# Patient Record
Sex: Male | Born: 1935 | Race: White | Hispanic: No | Marital: Married | Smoking: Former smoker
Health system: Southern US, Community
[De-identification: ages and names within clinical notes are randomized; demographics above are authoritative.]

## PROBLEM LIST (undated history)

## (undated) DIAGNOSIS — K469 Unspecified abdominal hernia without obstruction or gangrene: Secondary | ICD-10-CM

## (undated) DIAGNOSIS — E7800 Pure hypercholesterolemia, unspecified: Secondary | ICD-10-CM

## (undated) DIAGNOSIS — D696 Thrombocytopenia, unspecified: Secondary | ICD-10-CM

---

## 1998-02-13 ENCOUNTER — Ambulatory Visit (HOSPITAL_COMMUNITY): Admission: RE | Admit: 1998-02-13 | Discharge: 1998-02-13 | Payer: Self-pay | Admitting: Family Medicine

## 2007-07-10 ENCOUNTER — Emergency Department (HOSPITAL_COMMUNITY): Admission: EM | Admit: 2007-07-10 | Discharge: 2007-07-10 | Payer: Self-pay | Admitting: Emergency Medicine

## 2008-12-30 ENCOUNTER — Encounter: Admission: RE | Admit: 2008-12-30 | Discharge: 2008-12-30 | Payer: Self-pay | Admitting: Family Medicine

## 2009-01-24 ENCOUNTER — Ambulatory Visit: Payer: Self-pay | Admitting: Gastroenterology

## 2009-02-06 ENCOUNTER — Ambulatory Visit: Payer: Self-pay | Admitting: Gastroenterology

## 2010-12-18 LAB — DIFFERENTIAL
Basophils Absolute: 0.1
Eosinophils Absolute: 0.1
Lymphocytes Relative: 23
Lymphs Abs: 1.7
Monocytes Absolute: 0.5
Neutrophils Relative %: 68

## 2010-12-18 LAB — URINALYSIS, ROUTINE W REFLEX MICROSCOPIC
Nitrite: NEGATIVE
Specific Gravity, Urine: 1.005
pH: 6

## 2010-12-18 LAB — POCT CARDIAC MARKERS
CKMB, poc: 1.5
Operator id: 272551
Troponin i, poc: <0.05

## 2010-12-18 LAB — POCT I-STAT, CHEM 8
BUN: 11
Calcium, Ion: 1.17
Chloride: 107
Creatinine, Ser: 1
Glucose, Bld: 98
HCT: 44
Hemoglobin: 15
Sodium: 140
TCO2: 25

## 2010-12-18 LAB — CBC
RBC: 4.75
RDW: 14.1

## 2011-07-30 DIAGNOSIS — N401 Enlarged prostate with lower urinary tract symptoms: Secondary | ICD-10-CM | POA: Diagnosis not present

## 2011-08-06 DIAGNOSIS — N401 Enlarged prostate with lower urinary tract symptoms: Secondary | ICD-10-CM | POA: Diagnosis not present

## 2011-08-06 DIAGNOSIS — R972 Elevated prostate specific antigen [PSA]: Secondary | ICD-10-CM | POA: Diagnosis not present

## 2011-08-29 DIAGNOSIS — E785 Hyperlipidemia, unspecified: Secondary | ICD-10-CM | POA: Diagnosis not present

## 2011-09-05 DIAGNOSIS — M949 Disorder of cartilage, unspecified: Secondary | ICD-10-CM | POA: Diagnosis not present

## 2011-09-05 DIAGNOSIS — E785 Hyperlipidemia, unspecified: Secondary | ICD-10-CM | POA: Diagnosis not present

## 2011-09-05 DIAGNOSIS — L570 Actinic keratosis: Secondary | ICD-10-CM | POA: Diagnosis not present

## 2011-09-05 DIAGNOSIS — M899 Disorder of bone, unspecified: Secondary | ICD-10-CM | POA: Diagnosis not present

## 2011-09-05 DIAGNOSIS — F17200 Nicotine dependence, unspecified, uncomplicated: Secondary | ICD-10-CM | POA: Diagnosis not present

## 2011-09-05 DIAGNOSIS — B079 Viral wart, unspecified: Secondary | ICD-10-CM | POA: Diagnosis not present

## 2012-01-17 DIAGNOSIS — Z23 Encounter for immunization: Secondary | ICD-10-CM | POA: Diagnosis not present

## 2012-03-26 DIAGNOSIS — E785 Hyperlipidemia, unspecified: Secondary | ICD-10-CM | POA: Diagnosis not present

## 2012-03-26 DIAGNOSIS — Z0000 Encounter for general adult medical examination without abnormal findings: Secondary | ICD-10-CM | POA: Diagnosis not present

## 2012-03-26 DIAGNOSIS — Z1331 Encounter for screening for depression: Secondary | ICD-10-CM | POA: Diagnosis not present

## 2012-03-26 DIAGNOSIS — M949 Disorder of cartilage, unspecified: Secondary | ICD-10-CM | POA: Diagnosis not present

## 2012-04-09 DIAGNOSIS — M899 Disorder of bone, unspecified: Secondary | ICD-10-CM | POA: Diagnosis not present

## 2012-04-09 DIAGNOSIS — E785 Hyperlipidemia, unspecified: Secondary | ICD-10-CM | POA: Diagnosis not present

## 2012-04-09 DIAGNOSIS — M949 Disorder of cartilage, unspecified: Secondary | ICD-10-CM | POA: Diagnosis not present

## 2012-04-09 DIAGNOSIS — F17200 Nicotine dependence, unspecified, uncomplicated: Secondary | ICD-10-CM | POA: Diagnosis not present

## 2012-04-09 DIAGNOSIS — N182 Chronic kidney disease, stage 2 (mild): Secondary | ICD-10-CM | POA: Diagnosis not present

## 2012-04-09 DIAGNOSIS — N400 Enlarged prostate without lower urinary tract symptoms: Secondary | ICD-10-CM | POA: Diagnosis not present

## 2012-04-21 DIAGNOSIS — L570 Actinic keratosis: Secondary | ICD-10-CM | POA: Diagnosis not present

## 2012-04-21 DIAGNOSIS — C44611 Basal cell carcinoma of skin of unspecified upper limb, including shoulder: Secondary | ICD-10-CM | POA: Diagnosis not present

## 2012-04-21 DIAGNOSIS — D485 Neoplasm of uncertain behavior of skin: Secondary | ICD-10-CM | POA: Diagnosis not present

## 2012-04-21 DIAGNOSIS — D0460 Carcinoma in situ of skin of unspecified upper limb, including shoulder: Secondary | ICD-10-CM | POA: Diagnosis not present

## 2012-05-21 DIAGNOSIS — D0460 Carcinoma in situ of skin of unspecified upper limb, including shoulder: Secondary | ICD-10-CM | POA: Diagnosis not present

## 2012-05-21 DIAGNOSIS — C44611 Basal cell carcinoma of skin of unspecified upper limb, including shoulder: Secondary | ICD-10-CM | POA: Diagnosis not present

## 2012-06-16 DIAGNOSIS — H6120 Impacted cerumen, unspecified ear: Secondary | ICD-10-CM | POA: Diagnosis not present

## 2012-06-16 DIAGNOSIS — H902 Conductive hearing loss, unspecified: Secondary | ICD-10-CM | POA: Diagnosis not present

## 2012-08-05 DIAGNOSIS — R972 Elevated prostate specific antigen [PSA]: Secondary | ICD-10-CM | POA: Diagnosis not present

## 2012-08-05 DIAGNOSIS — N401 Enlarged prostate with lower urinary tract symptoms: Secondary | ICD-10-CM | POA: Diagnosis not present

## 2012-09-30 DIAGNOSIS — M899 Disorder of bone, unspecified: Secondary | ICD-10-CM | POA: Diagnosis not present

## 2012-09-30 DIAGNOSIS — E785 Hyperlipidemia, unspecified: Secondary | ICD-10-CM | POA: Diagnosis not present

## 2012-09-30 DIAGNOSIS — M949 Disorder of cartilage, unspecified: Secondary | ICD-10-CM | POA: Diagnosis not present

## 2012-10-06 DIAGNOSIS — E785 Hyperlipidemia, unspecified: Secondary | ICD-10-CM | POA: Diagnosis not present

## 2012-10-06 DIAGNOSIS — M949 Disorder of cartilage, unspecified: Secondary | ICD-10-CM | POA: Diagnosis not present

## 2012-10-06 DIAGNOSIS — F17200 Nicotine dependence, unspecified, uncomplicated: Secondary | ICD-10-CM | POA: Diagnosis not present

## 2012-10-06 DIAGNOSIS — N182 Chronic kidney disease, stage 2 (mild): Secondary | ICD-10-CM | POA: Diagnosis not present

## 2013-01-08 DIAGNOSIS — Z23 Encounter for immunization: Secondary | ICD-10-CM | POA: Diagnosis not present

## 2013-01-15 DIAGNOSIS — J019 Acute sinusitis, unspecified: Secondary | ICD-10-CM | POA: Diagnosis not present

## 2013-03-31 DIAGNOSIS — Z0000 Encounter for general adult medical examination without abnormal findings: Secondary | ICD-10-CM | POA: Diagnosis not present

## 2013-03-31 DIAGNOSIS — Z1331 Encounter for screening for depression: Secondary | ICD-10-CM | POA: Diagnosis not present

## 2013-03-31 DIAGNOSIS — M899 Disorder of bone, unspecified: Secondary | ICD-10-CM | POA: Diagnosis not present

## 2013-03-31 DIAGNOSIS — E785 Hyperlipidemia, unspecified: Secondary | ICD-10-CM | POA: Diagnosis not present

## 2013-03-31 DIAGNOSIS — M949 Disorder of cartilage, unspecified: Secondary | ICD-10-CM | POA: Diagnosis not present

## 2013-03-31 DIAGNOSIS — Z125 Encounter for screening for malignant neoplasm of prostate: Secondary | ICD-10-CM | POA: Diagnosis not present

## 2013-04-08 DIAGNOSIS — N400 Enlarged prostate without lower urinary tract symptoms: Secondary | ICD-10-CM | POA: Diagnosis not present

## 2013-04-08 DIAGNOSIS — M949 Disorder of cartilage, unspecified: Secondary | ICD-10-CM | POA: Diagnosis not present

## 2013-04-08 DIAGNOSIS — M899 Disorder of bone, unspecified: Secondary | ICD-10-CM | POA: Diagnosis not present

## 2013-04-08 DIAGNOSIS — E785 Hyperlipidemia, unspecified: Secondary | ICD-10-CM | POA: Diagnosis not present

## 2013-04-08 DIAGNOSIS — N182 Chronic kidney disease, stage 2 (mild): Secondary | ICD-10-CM | POA: Diagnosis not present

## 2013-06-08 DIAGNOSIS — R079 Chest pain, unspecified: Secondary | ICD-10-CM | POA: Diagnosis not present

## 2013-06-08 DIAGNOSIS — K219 Gastro-esophageal reflux disease without esophagitis: Secondary | ICD-10-CM | POA: Diagnosis not present

## 2013-06-08 DIAGNOSIS — R1013 Epigastric pain: Secondary | ICD-10-CM | POA: Diagnosis not present

## 2013-10-07 DIAGNOSIS — M949 Disorder of cartilage, unspecified: Secondary | ICD-10-CM | POA: Diagnosis not present

## 2013-10-07 DIAGNOSIS — E785 Hyperlipidemia, unspecified: Secondary | ICD-10-CM | POA: Diagnosis not present

## 2013-10-07 DIAGNOSIS — M899 Disorder of bone, unspecified: Secondary | ICD-10-CM | POA: Diagnosis not present

## 2013-10-14 DIAGNOSIS — M949 Disorder of cartilage, unspecified: Secondary | ICD-10-CM | POA: Diagnosis not present

## 2013-10-14 DIAGNOSIS — E785 Hyperlipidemia, unspecified: Secondary | ICD-10-CM | POA: Diagnosis not present

## 2013-10-14 DIAGNOSIS — N400 Enlarged prostate without lower urinary tract symptoms: Secondary | ICD-10-CM | POA: Diagnosis not present

## 2013-10-14 DIAGNOSIS — F17200 Nicotine dependence, unspecified, uncomplicated: Secondary | ICD-10-CM | POA: Diagnosis not present

## 2013-10-14 DIAGNOSIS — M899 Disorder of bone, unspecified: Secondary | ICD-10-CM | POA: Diagnosis not present

## 2013-10-14 DIAGNOSIS — N182 Chronic kidney disease, stage 2 (mild): Secondary | ICD-10-CM | POA: Diagnosis not present

## 2013-12-31 DIAGNOSIS — Z23 Encounter for immunization: Secondary | ICD-10-CM | POA: Diagnosis not present

## 2014-04-14 DIAGNOSIS — Z0000 Encounter for general adult medical examination without abnormal findings: Secondary | ICD-10-CM | POA: Diagnosis not present

## 2014-04-14 DIAGNOSIS — Z125 Encounter for screening for malignant neoplasm of prostate: Secondary | ICD-10-CM | POA: Diagnosis not present

## 2014-04-14 DIAGNOSIS — Z23 Encounter for immunization: Secondary | ICD-10-CM | POA: Diagnosis not present

## 2014-04-14 DIAGNOSIS — F17210 Nicotine dependence, cigarettes, uncomplicated: Secondary | ICD-10-CM | POA: Diagnosis not present

## 2014-04-14 DIAGNOSIS — Z1389 Encounter for screening for other disorder: Secondary | ICD-10-CM | POA: Diagnosis not present

## 2014-04-14 DIAGNOSIS — M859 Disorder of bone density and structure, unspecified: Secondary | ICD-10-CM | POA: Diagnosis not present

## 2014-04-14 DIAGNOSIS — E785 Hyperlipidemia, unspecified: Secondary | ICD-10-CM | POA: Diagnosis not present

## 2014-04-21 DIAGNOSIS — E785 Hyperlipidemia, unspecified: Secondary | ICD-10-CM | POA: Diagnosis not present

## 2014-04-21 DIAGNOSIS — N182 Chronic kidney disease, stage 2 (mild): Secondary | ICD-10-CM | POA: Diagnosis not present

## 2014-04-21 DIAGNOSIS — N400 Enlarged prostate without lower urinary tract symptoms: Secondary | ICD-10-CM | POA: Diagnosis not present

## 2014-04-21 DIAGNOSIS — M859 Disorder of bone density and structure, unspecified: Secondary | ICD-10-CM | POA: Diagnosis not present

## 2014-04-21 DIAGNOSIS — F17210 Nicotine dependence, cigarettes, uncomplicated: Secondary | ICD-10-CM | POA: Diagnosis not present

## 2014-10-20 DIAGNOSIS — N400 Enlarged prostate without lower urinary tract symptoms: Secondary | ICD-10-CM | POA: Diagnosis not present

## 2014-10-20 DIAGNOSIS — M859 Disorder of bone density and structure, unspecified: Secondary | ICD-10-CM | POA: Diagnosis not present

## 2014-10-20 DIAGNOSIS — E785 Hyperlipidemia, unspecified: Secondary | ICD-10-CM | POA: Diagnosis not present

## 2014-10-27 DIAGNOSIS — N182 Chronic kidney disease, stage 2 (mild): Secondary | ICD-10-CM | POA: Diagnosis not present

## 2014-10-27 DIAGNOSIS — N400 Enlarged prostate without lower urinary tract symptoms: Secondary | ICD-10-CM | POA: Diagnosis not present

## 2014-10-27 DIAGNOSIS — I714 Abdominal aortic aneurysm, without rupture: Secondary | ICD-10-CM | POA: Diagnosis not present

## 2014-10-27 DIAGNOSIS — F17210 Nicotine dependence, cigarettes, uncomplicated: Secondary | ICD-10-CM | POA: Diagnosis not present

## 2014-10-27 DIAGNOSIS — D696 Thrombocytopenia, unspecified: Secondary | ICD-10-CM | POA: Diagnosis not present

## 2015-01-06 DIAGNOSIS — Z23 Encounter for immunization: Secondary | ICD-10-CM | POA: Diagnosis not present

## 2015-04-25 DIAGNOSIS — Z1389 Encounter for screening for other disorder: Secondary | ICD-10-CM | POA: Diagnosis not present

## 2015-04-25 DIAGNOSIS — M8580 Other specified disorders of bone density and structure, unspecified site: Secondary | ICD-10-CM | POA: Diagnosis not present

## 2015-04-25 DIAGNOSIS — Z125 Encounter for screening for malignant neoplasm of prostate: Secondary | ICD-10-CM | POA: Diagnosis not present

## 2015-04-25 DIAGNOSIS — Z0000 Encounter for general adult medical examination without abnormal findings: Secondary | ICD-10-CM | POA: Diagnosis not present

## 2015-04-25 DIAGNOSIS — E663 Overweight: Secondary | ICD-10-CM | POA: Diagnosis not present

## 2015-04-25 DIAGNOSIS — E785 Hyperlipidemia, unspecified: Secondary | ICD-10-CM | POA: Diagnosis not present

## 2015-04-25 DIAGNOSIS — E559 Vitamin D deficiency, unspecified: Secondary | ICD-10-CM | POA: Diagnosis not present

## 2015-05-02 ENCOUNTER — Other Ambulatory Visit: Payer: Self-pay | Admitting: Internal Medicine

## 2015-05-02 DIAGNOSIS — D696 Thrombocytopenia, unspecified: Secondary | ICD-10-CM | POA: Diagnosis not present

## 2015-05-02 DIAGNOSIS — N182 Chronic kidney disease, stage 2 (mild): Secondary | ICD-10-CM | POA: Diagnosis not present

## 2015-05-02 DIAGNOSIS — F17210 Nicotine dependence, cigarettes, uncomplicated: Secondary | ICD-10-CM | POA: Diagnosis not present

## 2015-05-02 DIAGNOSIS — R0989 Other specified symptoms and signs involving the circulatory and respiratory systems: Secondary | ICD-10-CM

## 2015-05-02 DIAGNOSIS — I714 Abdominal aortic aneurysm, without rupture: Secondary | ICD-10-CM | POA: Diagnosis not present

## 2015-05-02 DIAGNOSIS — N400 Enlarged prostate without lower urinary tract symptoms: Secondary | ICD-10-CM | POA: Diagnosis not present

## 2015-05-02 DIAGNOSIS — E785 Hyperlipidemia, unspecified: Secondary | ICD-10-CM | POA: Diagnosis not present

## 2015-05-03 ENCOUNTER — Ambulatory Visit
Admission: RE | Admit: 2015-05-03 | Discharge: 2015-05-03 | Disposition: A | Discharge status: Home / Self Care | Payer: Medicare Other | Source: Ambulatory Visit | Attending: Internal Medicine | Admitting: Internal Medicine

## 2015-05-03 DIAGNOSIS — Z136 Encounter for screening for cardiovascular disorders: Secondary | ICD-10-CM | POA: Diagnosis not present

## 2015-05-03 DIAGNOSIS — I77811 Abdominal aortic ectasia: Secondary | ICD-10-CM | POA: Diagnosis not present

## 2015-05-03 DIAGNOSIS — R0989 Other specified symptoms and signs involving the circulatory and respiratory systems: Secondary | ICD-10-CM

## 2015-10-25 DIAGNOSIS — E559 Vitamin D deficiency, unspecified: Secondary | ICD-10-CM | POA: Diagnosis not present

## 2015-10-25 DIAGNOSIS — M8580 Other specified disorders of bone density and structure, unspecified site: Secondary | ICD-10-CM | POA: Diagnosis not present

## 2015-10-25 DIAGNOSIS — E785 Hyperlipidemia, unspecified: Secondary | ICD-10-CM | POA: Diagnosis not present

## 2015-10-25 DIAGNOSIS — N400 Enlarged prostate without lower urinary tract symptoms: Secondary | ICD-10-CM | POA: Diagnosis not present

## 2015-11-01 DIAGNOSIS — I714 Abdominal aortic aneurysm, without rupture: Secondary | ICD-10-CM | POA: Diagnosis not present

## 2015-11-01 DIAGNOSIS — D696 Thrombocytopenia, unspecified: Secondary | ICD-10-CM | POA: Diagnosis not present

## 2015-11-01 DIAGNOSIS — F17210 Nicotine dependence, cigarettes, uncomplicated: Secondary | ICD-10-CM | POA: Diagnosis not present

## 2015-11-01 DIAGNOSIS — E785 Hyperlipidemia, unspecified: Secondary | ICD-10-CM | POA: Diagnosis not present

## 2015-11-01 DIAGNOSIS — N182 Chronic kidney disease, stage 2 (mild): Secondary | ICD-10-CM | POA: Diagnosis not present

## 2016-01-05 DIAGNOSIS — Z23 Encounter for immunization: Secondary | ICD-10-CM | POA: Diagnosis not present

## 2016-04-30 DIAGNOSIS — E559 Vitamin D deficiency, unspecified: Secondary | ICD-10-CM | POA: Diagnosis not present

## 2016-04-30 DIAGNOSIS — E785 Hyperlipidemia, unspecified: Secondary | ICD-10-CM | POA: Diagnosis not present

## 2016-04-30 DIAGNOSIS — M8580 Other specified disorders of bone density and structure, unspecified site: Secondary | ICD-10-CM | POA: Diagnosis not present

## 2016-04-30 DIAGNOSIS — Z125 Encounter for screening for malignant neoplasm of prostate: Secondary | ICD-10-CM | POA: Diagnosis not present

## 2016-05-07 DIAGNOSIS — D696 Thrombocytopenia, unspecified: Secondary | ICD-10-CM | POA: Diagnosis not present

## 2016-05-07 DIAGNOSIS — I714 Abdominal aortic aneurysm, without rupture: Secondary | ICD-10-CM | POA: Diagnosis not present

## 2016-05-07 DIAGNOSIS — E785 Hyperlipidemia, unspecified: Secondary | ICD-10-CM | POA: Diagnosis not present

## 2016-05-07 DIAGNOSIS — N182 Chronic kidney disease, stage 2 (mild): Secondary | ICD-10-CM | POA: Diagnosis not present

## 2016-05-16 ENCOUNTER — Encounter: Payer: Self-pay | Admitting: Hematology and Oncology

## 2016-05-16 ENCOUNTER — Telehealth: Payer: Self-pay | Admitting: Hematology and Oncology

## 2016-05-16 NOTE — Telephone Encounter (Signed)
Appt has been scheduled for the pt to see Dr. Lindi Adie on 3/13 at 1pm. Per pt, preferred afternoon appt. Demographics verified. Aware to arrive 30 minutes early. Letter mailed.

## 2016-06-04 ENCOUNTER — Ambulatory Visit (HOSPITAL_BASED_OUTPATIENT_CLINIC_OR_DEPARTMENT_OTHER): Payer: Medicare Other | Admitting: Hematology and Oncology

## 2016-06-04 ENCOUNTER — Encounter: Payer: Self-pay | Admitting: Hematology and Oncology

## 2016-06-04 ENCOUNTER — Ambulatory Visit (HOSPITAL_BASED_OUTPATIENT_CLINIC_OR_DEPARTMENT_OTHER): Payer: Medicare Other

## 2016-06-04 DIAGNOSIS — D693 Immune thrombocytopenic purpura: Secondary | ICD-10-CM | POA: Diagnosis not present

## 2016-06-04 DIAGNOSIS — D696 Thrombocytopenia, unspecified: Secondary | ICD-10-CM | POA: Diagnosis not present

## 2016-06-04 LAB — CBC WITH DIFFERENTIAL/PLATELET
BASO%: 0.9 % (ref 0.0–2.0)
BASOS ABS: 0.1 10*3/uL (ref 0.0–0.1)
EOS%: 3.8 % (ref 0.0–7.0)
Eosinophils Absolute: 0.2 10*3/uL (ref 0.0–0.5)
HCT: 44.2 % (ref 38.4–49.9)
HGB: 15.1 g/dL (ref 13.0–17.1)
LYMPH%: 21.7 % (ref 14.0–49.0)
MCH: 29.7 pg (ref 27.2–33.4)
MCHC: 34.2 g/dL (ref 32.0–36.0)
MCV: 86.8 fL (ref 79.3–98.0)
MONO#: 0.4 10*3/uL (ref 0.1–0.9)
MONO%: 6.6 % (ref 0.0–14.0)
NEUT#: 3.7 10*3/uL (ref 1.5–6.5)
NEUT%: 67 % (ref 39.0–75.0)
Platelets: 102 10*3/uL — ABNORMAL LOW (ref 140–400)
RBC: 5.09 10*6/uL (ref 4.20–5.82)
RDW: 14.6 % (ref 11.0–14.6)
WBC: 5.6 10*3/uL (ref 4.0–10.3)
lymph#: 1.2 10*3/uL (ref 0.9–3.3)
nRBC: 0 % (ref 0–0)

## 2016-06-04 NOTE — Assessment & Plan Note (Signed)
Mild thrombocytopenia: Platelet count 100 on 04/30/2016; 99 on 10/25/2015, 100 in 04/25/2015 Remainder of the CBC with differential was normal, hemoglobin 14.3, WBC 5.2 with normal differential  Differential diagnosis: 1. Low-grade ITP 2. medication induced: On further review patient is not taking any medications that are associated with thrombocytopenia 3. Bone marrow factors 4. Hepatitis B/C 5. Splenomegaly: No enlarged spleen was palpable to physical exam.  I discussed with the patient that the level of thrombocytopenia is very mild and that these levels, there are usually no adverse effects. There is usually no risk of bleeding and hence it can be observed without making any changes to patient's medications or requiring any further investigations like bone marrow biopsies.  I recommended watchful monitoring. We can see the patient back in one year with labs and follow-up.

## 2016-06-04 NOTE — Progress Notes (Signed)
[facility] CONSULT NOTE  Patient Care Team: Merrilee Seashore, MD as PCP - General (Internal Medicine)  CHIEF COMPLAINTS/PURPOSE OF CONSULTATION:  Thrombocytopenia  HISTORY OF PRESENTING ILLNESS:  Zachary Bennett 82 y.o. male is here because of long-standing diagnosis of thrombocytopenia. He had recent blood work on 04/30/2013 which showed a platelet count of 100. In January 2017 he had the same platelet count. In April 2009 he had a platelet count of 124. He has had normal white count and normal hemoglobin levels. He denies any problems with bruising or bleeding.   I reviewed her records extensively and collaborated the history with the patient.  MEDICAL HISTORY:  AAA, hyperlipidemia, thrombocytopenia, GERD, chronic renal failure, BPH, osteopenia SURGICAL HISTORY: No prior surgeries SOCIAL HISTORY: Current tobacco user denies any alcohol integration drug use FAMILY HISTORY: Mother deceased in her 81s with cancer unknown type ALLERGIES:  has no allergies on file. MEDICATIONS:  Pravastatin 40 mg daily, aspirin 81 mg daily  REVIEW OF SYSTEMS:   Constitutional: Denies fevers, chills or abnormal night sweats Eyes: Denies blurriness of vision, double vision or watery eyes Ears, nose, mouth, throat, and face: Denies mucositis or sore throat Respiratory: Denies cough, dyspnea or wheezes Cardiovascular: Denies palpitation, chest discomfort or lower extremity swelling Gastrointestinal:  Enlarged spleen, no hepatomegaly Skin: Denies abnormal skin rashes Lymphatics: Denies new lymphadenopathy or easy bruising Neurological:Denies numbness, tingling or new weaknesses Behavioral/Psych: Mood is stable, no new changes  All other systems were reviewed with the patient and are negative.  PHYSICAL EXAMINATION: ECOG PERFORMANCE STATUS: 0 - Asymptomatic  Vitals:   06/04/16 1308  BP: (!) 143/67  Pulse: 77  Resp: 17  Temp: 97.4 F (36.3 C)   Filed Weights   06/04/16 1308   Weight: 173 lb 6.4 oz (78.7 kg)    GENERAL:alert, no distress and comfortable SKIN: skin color, texture, turgor are normal, no rashes or significant lesions EYES: normal, conjunctiva are pink and non-injected, sclera clear OROPHARYNX:no exudate, no erythema and lips, buccal mucosa, and tongue normal  NECK: supple, thyroid normal size, non-tender, without nodularity LYMPH:  no palpable lymphadenopathy in the cervical, axillary or inguinal LUNGS: clear to auscultation and percussion with normal breathing effort HEART: regular rate & rhythm and no murmurs and no lower extremity edema ABDOMEN:abdomen soft, non-tender and normal bowel sounds Musculoskeletal:no cyanosis of digits and no clubbing  PSYCH: alert & oriented x 3 with fluent speech NEURO: no focal motor/sensory deficits LABORATORY DATA:  I have reviewed the data as listed Lab Results  Component Value Date   WBC 5.6 06/04/2016   HGB 15.1 06/04/2016   HCT 44.2 06/04/2016   MCV 86.8 06/04/2016   PLT 102 (L) 06/04/2016   Lab Results  Component Value Date   NA 140 07/10/2007   K 4.1 07/10/2007   CL 107 07/10/2007    RADIOGRAPHIC STUDIES: I have personally reviewed the radiological reports and agreed with the findings in the report.  ASSESSMENT AND PLAN:  Thrombocytopenia ([place]) Mild thrombocytopenia: Platelet count 100 on 04/30/2016; 99 on 10/25/2015, 100 in 04/25/2015 Remainder of the CBC with differential was normal, hemoglobin 14.3, WBC 5.2 with normal differential  Differential diagnosis: 1. Low-grade ITP 2. medication induced: On further review patient is not taking any medications that are associated with thrombocytopenia 3. Bone marrow factors 4. Hepatitis B/C 5. Splenomegaly: No enlarged spleen was palpable to physical exam.  I ordered CBC with differential, hepatitis B and C testing and HIV testing, L-37 and folic acid levels. I  also obtained an ultrasound of the abdomen because I felt an enlarged spleen. I  will call him with the results of these tests. Patient does not need any acute intervention at this time.  I discussed with the patient that the level of thrombocytopenia is very mild and that these levels, there are usually no adverse effects. There is usually no risk of bleeding and hence it can be observed without making any changes to patient's medications or requiring any further investigations like bone marrow biopsies. If his platelets drop significantly, then we will perform a bone marrow biopsy. I recommended watchful monitoring. We can see the patient back in 6 months with labs and follow-up.  All questions were answered. The patient knows to call the clinic with any problems, questions or concerns.    Rulon Eisenmenger, MD 06/04/16

## 2016-06-05 LAB — VITAMIN B12: Vitamin B12: 577 pg/mL (ref 232–1245)

## 2016-06-05 LAB — FOLATE: Folate: >20 ng/mL (ref >3.0)

## 2016-06-05 LAB — HEPATITIS B SURFACE ANTIGEN: HBsAg Screen: NEGATIVE

## 2016-06-05 LAB — HIV ANTIBODY (ROUTINE TESTING W REFLEX): HIV Screen 4th Generation wRfx: NONREACTIVE

## 2016-06-05 LAB — HEPATITIS C ANTIBODY

## 2016-06-06 ENCOUNTER — Other Ambulatory Visit: Payer: Self-pay | Admitting: Emergency Medicine

## 2016-06-12 ENCOUNTER — Ambulatory Visit (HOSPITAL_COMMUNITY)
Admission: RE | Admit: 2016-06-12 | Discharge: 2016-06-12 | Disposition: A | Discharge status: Home / Self Care | Payer: Medicare Other | Source: Ambulatory Visit | Attending: Hematology and Oncology | Admitting: Hematology and Oncology

## 2016-06-12 DIAGNOSIS — R161 Splenomegaly, not elsewhere classified: Secondary | ICD-10-CM | POA: Insufficient documentation

## 2016-06-12 DIAGNOSIS — D696 Thrombocytopenia, unspecified: Secondary | ICD-10-CM | POA: Insufficient documentation

## 2016-09-13 DIAGNOSIS — H6123 Impacted cerumen, bilateral: Secondary | ICD-10-CM | POA: Diagnosis not present

## 2016-10-29 DIAGNOSIS — N400 Enlarged prostate without lower urinary tract symptoms: Secondary | ICD-10-CM | POA: Diagnosis not present

## 2016-10-29 DIAGNOSIS — M8580 Other specified disorders of bone density and structure, unspecified site: Secondary | ICD-10-CM | POA: Diagnosis not present

## 2016-10-29 DIAGNOSIS — E559 Vitamin D deficiency, unspecified: Secondary | ICD-10-CM | POA: Diagnosis not present

## 2016-10-29 DIAGNOSIS — E785 Hyperlipidemia, unspecified: Secondary | ICD-10-CM | POA: Diagnosis not present

## 2016-10-29 DIAGNOSIS — N182 Chronic kidney disease, stage 2 (mild): Secondary | ICD-10-CM | POA: Diagnosis not present

## 2016-11-05 DIAGNOSIS — I714 Abdominal aortic aneurysm, without rupture: Secondary | ICD-10-CM | POA: Diagnosis not present

## 2016-11-05 DIAGNOSIS — N182 Chronic kidney disease, stage 2 (mild): Secondary | ICD-10-CM | POA: Diagnosis not present

## 2016-11-05 DIAGNOSIS — E785 Hyperlipidemia, unspecified: Secondary | ICD-10-CM | POA: Diagnosis not present

## 2016-11-05 DIAGNOSIS — D696 Thrombocytopenia, unspecified: Secondary | ICD-10-CM | POA: Diagnosis not present

## 2016-12-04 ENCOUNTER — Other Ambulatory Visit: Payer: Self-pay

## 2016-12-04 DIAGNOSIS — D696 Thrombocytopenia, unspecified: Secondary | ICD-10-CM

## 2016-12-05 ENCOUNTER — Other Ambulatory Visit: Payer: Medicare Other

## 2016-12-05 ENCOUNTER — Ambulatory Visit: Payer: Medicare Other | Admitting: Hematology and Oncology

## 2016-12-05 NOTE — Assessment & Plan Note (Deleted)
Mild thrombocytopenia: Platelet count 100 on 04/30/2016; 99 on 10/25/2015, 100 in 04/25/2015 Remainder of the CBC with differential was normal, hemoglobin 14.3, WBC 5.2 with normal differential  Differential diagnosis: 1. Low-grade ITP 2. Bone marrow factors 3. B-12 577, folate > 20 4. Hepatitis B/C an HIV: Negative 5. Splenomegaly: Patient had moderate splenomegaly 17.4 cm  I suspect the cause of her thrombocytopenia to be related to splenic sequestration

## 2017-01-03 DIAGNOSIS — Z23 Encounter for immunization: Secondary | ICD-10-CM | POA: Diagnosis not present

## 2017-05-02 DIAGNOSIS — E559 Vitamin D deficiency, unspecified: Secondary | ICD-10-CM | POA: Diagnosis not present

## 2017-05-02 DIAGNOSIS — Z0000 Encounter for general adult medical examination without abnormal findings: Secondary | ICD-10-CM | POA: Diagnosis not present

## 2017-05-02 DIAGNOSIS — M8580 Other specified disorders of bone density and structure, unspecified site: Secondary | ICD-10-CM | POA: Diagnosis not present

## 2017-05-02 DIAGNOSIS — E785 Hyperlipidemia, unspecified: Secondary | ICD-10-CM | POA: Diagnosis not present

## 2017-05-02 DIAGNOSIS — Z125 Encounter for screening for malignant neoplasm of prostate: Secondary | ICD-10-CM | POA: Diagnosis not present

## 2017-05-08 DIAGNOSIS — Z0000 Encounter for general adult medical examination without abnormal findings: Secondary | ICD-10-CM | POA: Diagnosis not present

## 2017-05-08 DIAGNOSIS — N400 Enlarged prostate without lower urinary tract symptoms: Secondary | ICD-10-CM | POA: Diagnosis not present

## 2017-05-08 DIAGNOSIS — E785 Hyperlipidemia, unspecified: Secondary | ICD-10-CM | POA: Diagnosis not present

## 2017-05-08 DIAGNOSIS — I714 Abdominal aortic aneurysm, without rupture: Secondary | ICD-10-CM | POA: Diagnosis not present

## 2017-05-08 DIAGNOSIS — N182 Chronic kidney disease, stage 2 (mild): Secondary | ICD-10-CM | POA: Diagnosis not present

## 2017-05-08 DIAGNOSIS — R972 Elevated prostate specific antigen [PSA]: Secondary | ICD-10-CM | POA: Diagnosis not present

## 2017-05-08 DIAGNOSIS — D696 Thrombocytopenia, unspecified: Secondary | ICD-10-CM | POA: Diagnosis not present

## 2017-05-08 DIAGNOSIS — M8580 Other specified disorders of bone density and structure, unspecified site: Secondary | ICD-10-CM | POA: Diagnosis not present

## 2017-05-08 DIAGNOSIS — K4020 Bilateral inguinal hernia, without obstruction or gangrene, not specified as recurrent: Secondary | ICD-10-CM | POA: Diagnosis not present

## 2017-05-15 DIAGNOSIS — D696 Thrombocytopenia, unspecified: Secondary | ICD-10-CM | POA: Diagnosis not present

## 2017-05-15 DIAGNOSIS — Z720 Tobacco use: Secondary | ICD-10-CM | POA: Diagnosis not present

## 2017-05-15 DIAGNOSIS — N189 Chronic kidney disease, unspecified: Secondary | ICD-10-CM | POA: Diagnosis not present

## 2017-05-15 DIAGNOSIS — K4020 Bilateral inguinal hernia, without obstruction or gangrene, not specified as recurrent: Secondary | ICD-10-CM | POA: Diagnosis not present

## 2017-12-26 DIAGNOSIS — Z23 Encounter for immunization: Secondary | ICD-10-CM | POA: Diagnosis not present

## 2018-07-22 ENCOUNTER — Encounter (HOSPITAL_COMMUNITY): Payer: Self-pay | Admitting: Emergency Medicine

## 2018-07-22 ENCOUNTER — Ambulatory Visit (INDEPENDENT_AMBULATORY_CARE_PROVIDER_SITE_OTHER)
Admission: EM | Admit: 2018-07-22 | Discharge: 2018-07-22 | Disposition: A | Discharge status: Home / Self Care | Payer: Medicare Other | Source: Home / Self Care | Attending: Family Medicine | Admitting: Family Medicine

## 2018-07-22 ENCOUNTER — Encounter (HOSPITAL_COMMUNITY): Payer: Self-pay

## 2018-07-22 ENCOUNTER — Inpatient Hospital Stay (HOSPITAL_COMMUNITY)
Admission: EM | Admit: 2018-07-22 | Discharge: 2018-07-25 | DRG: 683 | Disposition: A | Discharge status: Home / Self Care | Payer: Medicare Other | Source: Ambulatory Visit | Attending: Internal Medicine | Admitting: Internal Medicine

## 2018-07-22 ENCOUNTER — Inpatient Hospital Stay (HOSPITAL_COMMUNITY): Payer: Medicare Other

## 2018-07-22 ENCOUNTER — Other Ambulatory Visit: Payer: Self-pay

## 2018-07-22 ENCOUNTER — Emergency Department (HOSPITAL_COMMUNITY): Payer: Medicare Other

## 2018-07-22 DIAGNOSIS — R1013 Epigastric pain: Secondary | ICD-10-CM

## 2018-07-22 DIAGNOSIS — R7989 Other specified abnormal findings of blood chemistry: Secondary | ICD-10-CM

## 2018-07-22 DIAGNOSIS — E860 Dehydration: Secondary | ICD-10-CM | POA: Diagnosis present

## 2018-07-22 DIAGNOSIS — R61 Generalized hyperhidrosis: Secondary | ICD-10-CM | POA: Diagnosis present

## 2018-07-22 DIAGNOSIS — E785 Hyperlipidemia, unspecified: Secondary | ICD-10-CM | POA: Diagnosis present

## 2018-07-22 DIAGNOSIS — K4020 Bilateral inguinal hernia, without obstruction or gangrene, not specified as recurrent: Secondary | ICD-10-CM | POA: Diagnosis not present

## 2018-07-22 DIAGNOSIS — R6881 Early satiety: Secondary | ICD-10-CM | POA: Diagnosis present

## 2018-07-22 DIAGNOSIS — R630 Anorexia: Secondary | ICD-10-CM | POA: Diagnosis present

## 2018-07-22 DIAGNOSIS — R945 Abnormal results of liver function studies: Secondary | ICD-10-CM

## 2018-07-22 DIAGNOSIS — C8590 Non-Hodgkin lymphoma, unspecified, unspecified site: Secondary | ICD-10-CM

## 2018-07-22 DIAGNOSIS — R1012 Left upper quadrant pain: Secondary | ICD-10-CM

## 2018-07-22 DIAGNOSIS — N179 Acute kidney failure, unspecified: Secondary | ICD-10-CM

## 2018-07-22 DIAGNOSIS — N400 Enlarged prostate without lower urinary tract symptoms: Secondary | ICD-10-CM | POA: Diagnosis present

## 2018-07-22 DIAGNOSIS — R000 Tachycardia, unspecified: Secondary | ICD-10-CM

## 2018-07-22 DIAGNOSIS — R161 Splenomegaly, not elsewhere classified: Secondary | ICD-10-CM | POA: Diagnosis present

## 2018-07-22 DIAGNOSIS — R590 Localized enlarged lymph nodes: Secondary | ICD-10-CM | POA: Diagnosis present

## 2018-07-22 DIAGNOSIS — I723 Aneurysm of iliac artery: Secondary | ICD-10-CM | POA: Diagnosis present

## 2018-07-22 DIAGNOSIS — D61818 Other pancytopenia: Secondary | ICD-10-CM | POA: Diagnosis present

## 2018-07-22 DIAGNOSIS — F17210 Nicotine dependence, cigarettes, uncomplicated: Secondary | ICD-10-CM | POA: Diagnosis present

## 2018-07-22 DIAGNOSIS — K8020 Calculus of gallbladder without cholecystitis without obstruction: Secondary | ICD-10-CM | POA: Diagnosis not present

## 2018-07-22 DIAGNOSIS — J90 Pleural effusion, not elsewhere classified: Secondary | ICD-10-CM | POA: Diagnosis not present

## 2018-07-22 HISTORY — DX: Pure hypercholesterolemia, unspecified: E78.00

## 2018-07-22 HISTORY — DX: Unspecified abdominal hernia without obstruction or gangrene: K46.9

## 2018-07-22 HISTORY — DX: Thrombocytopenia, unspecified: D69.6

## 2018-07-22 LAB — CBC WITH DIFFERENTIAL/PLATELET
Abs Immature Granulocytes: 0.01 10*3/uL (ref 0.00–0.07)
Basophils Absolute: 0 10*3/uL (ref 0.0–0.1)
Basophils Relative: 1 %
Eosinophils Absolute: 0 10*3/uL (ref 0.0–0.5)
Eosinophils Relative: 1 %
HCT: 38.8 % — ABNORMAL LOW (ref 39.0–52.0)
Hemoglobin: 12.4 g/dL — ABNORMAL LOW (ref 13.0–17.0)
Immature Granulocytes: 0 %
Lymphocytes Relative: 25 %
Lymphs Abs: 0.7 10*3/uL (ref 0.7–4.0)
MCH: 26.5 pg (ref 26.0–34.0)
MCHC: 32 g/dL (ref 30.0–36.0)
MCV: 82.9 fL (ref 80.0–100.0)
Monocytes Absolute: 0.3 10*3/uL (ref 0.1–1.0)
Monocytes Relative: 9 %
Neutro Abs: 1.8 10*3/uL (ref 1.7–7.7)
Neutrophils Relative %: 64 %
Platelets: 49 10*3/uL — ABNORMAL LOW (ref 150–400)
RBC: 4.68 MIL/uL (ref 4.22–5.81)
RDW: 15 % (ref 11.5–15.5)
WBC: 2.9 10*3/uL — ABNORMAL LOW (ref 4.0–10.5)
nRBC: 0 % (ref 0.0–0.2)

## 2018-07-22 LAB — URINALYSIS, ROUTINE W REFLEX MICROSCOPIC
Glucose, UA: NEGATIVE mg/dL
Ketones, ur: 5 mg/dL — AB
Leukocytes,Ua: NEGATIVE
Nitrite: NEGATIVE
Protein, ur: 100 mg/dL — AB
Specific Gravity, Urine: 1.027 (ref 1.005–1.030)
pH: 5 (ref 5.0–8.0)

## 2018-07-22 LAB — COMPREHENSIVE METABOLIC PANEL
ALT: 40 U/L (ref 0–44)
AST: 93 U/L — ABNORMAL HIGH (ref 15–41)
Albumin: 3.7 g/dL (ref 3.5–5.0)
Alkaline Phosphatase: 301 U/L — ABNORMAL HIGH (ref 38–126)
Anion gap: 16 — ABNORMAL HIGH (ref 5–15)
BUN: 23 mg/dL (ref 8–23)
CO2: 21 mmol/L — ABNORMAL LOW (ref 22–32)
Calcium: 8.8 mg/dL — ABNORMAL LOW (ref 8.9–10.3)
Chloride: 97 mmol/L — ABNORMAL LOW (ref 98–111)
Creatinine, Ser: 1.65 mg/dL — ABNORMAL HIGH (ref 0.61–1.24)
GFR calc Af Amer: 44 mL/min — ABNORMAL LOW (ref >60)
GFR calc non Af Amer: 38 mL/min — ABNORMAL LOW (ref >60)
Glucose, Bld: 102 mg/dL — ABNORMAL HIGH (ref 70–99)
Potassium: 4.4 mmol/L (ref 3.5–5.1)
Sodium: 134 mmol/L — ABNORMAL LOW (ref 135–145)
Total Bilirubin: 4.9 mg/dL — ABNORMAL HIGH (ref 0.3–1.2)
Total Protein: 6.8 g/dL (ref 6.5–8.1)

## 2018-07-22 LAB — LIPASE, BLOOD: Lipase: 44 U/L (ref 11–51)

## 2018-07-22 MED ORDER — ONDANSETRON HCL 4 MG/2ML IJ SOLN
4.0000 mg | Freq: Once | INTRAMUSCULAR | Status: AC
  Administered 2018-07-22: 15:00:00 4 mg via INTRAVENOUS
  Filled 2018-07-22: qty 2

## 2018-07-22 MED ORDER — ZOLPIDEM TARTRATE 5 MG PO TABS
5.0000 mg | ORAL_TABLET | Freq: Every evening | ORAL | Status: DC | PRN
  Administered 2018-07-22 – 2018-07-24 (×3): 5 mg via ORAL
  Filled 2018-07-22 (×3): qty 1

## 2018-07-22 MED ORDER — SODIUM CHLORIDE 0.9 % IV SOLN
INTRAVENOUS | Status: DC
  Administered 2018-07-22: 15:00:00 via INTRAVENOUS

## 2018-07-22 MED ORDER — SODIUM CHLORIDE 0.9 % IV SOLN
INTRAVENOUS | Status: AC
  Administered 2018-07-22 – 2018-07-23 (×2): via INTRAVENOUS

## 2018-07-22 MED ORDER — IOHEXOL 300 MG/ML  SOLN
100.0000 mL | Freq: Once | INTRAMUSCULAR | Status: AC | PRN
  Administered 2018-07-22: 100 mL via INTRAVENOUS

## 2018-07-22 NOTE — ED Provider Notes (Signed)
[facility]   [id number] 07/22/18 Arrival Time: 6789  CC: ABDOMINAL pain  SUBJECTIVE:  Zachary Bennett is a 84 y.o. male hx significant for HLD thrombocytopenia, and bilateral abdominal hernias, who presents with complaint of gradual abdominal pain x 2 weeks.  Denies a precipitating event, trauma, close contacts with similar symptoms, recent travel or antibiotic use.  Localizes pain to epigastric region.  Describes as a "constant damn pain."  Relieved with stretching and having a bowel movement.  Has not tried OTC medications.  Denies aggravating factors.  Reports similar symptoms when he had appendicitis 30-40 years ago.  Last BM this morning and normal for patient. Complains of associated night sweats, weakness, and tachycardia.    Denies fever, chills, nausea, vomiting, chest pain, SOB, diarrhea, constipation, hematochezia, melena, dysuria, difficulty urinating, increased frequency or urgency, flank pain, loss of bowel or bladder function.  Last colonoscopy 10 years ago and normal at that time.  Told to come back in 10 years for next colonoscopy.    No LMP for male patient.  ROS: As per HPI.  Past Medical History:  Diagnosis Date  . Abdominal hernia   . High cholesterol   . Thrombocytopenia ([place])    No past surgical history on file. No Known Allergies No current facility-administered medications on file prior to encounter.    Current Outpatient Medications on File Prior to Encounter  Medication Sig Dispense Refill  . pravastatin (PRAVACHOL) 40 MG tablet      Social History   Socioeconomic History  . Marital status: Married    Spouse name: Not on file  . Number of children: Not on file  . Years of education: Not on file  . Highest education level: Not on file  Occupational History  . Not on file  Social Needs  . Financial resource strain: Not on file  . Food insecurity:    Worry: Not on file    Inability: Not on file  . Transportation needs:    Medical:  Not on file    Non-medical: Not on file  Tobacco Use  . Smoking status: Current Every Day Smoker  . Smokeless tobacco: Never Used  Substance and Sexual Activity  . Alcohol use: Not on file  . Drug use: Not on file  . Sexual activity: Not on file  Lifestyle  . Physical activity:    Days per week: Not on file    Minutes per session: Not on file  . Stress: Not on file  Relationships  . Social connections:    Talks on phone: Not on file    Gets together: Not on file    Attends religious service: Not on file    Active member of club or organization: Not on file    Attends meetings of clubs or organizations: Not on file    Relationship status: Not on file  . Intimate partner violence:    Fear of current or ex partner: Not on file    Emotionally abused: Not on file    Physically abused: Not on file    Forced sexual activity: Not on file  Other Topics Concern  . Not on file  Social History Narrative  . Not on file   No family history on file.   OBJECTIVE:  Vitals:   07/22/18 1118 07/22/18 1128  BP: 134/76   Pulse: (!) 110   Resp: 20   Temp: 97.9 F (36.6 C)   TempSrc: Oral   SpO2: 99%   Weight:  157 lb (71.2 kg)    General appearance: Alert; appears uncomfortable, but nontoxic HEENT: NCAT.  Oropharynx clear.  Lungs: clear to auscultation bilaterally without adventitious breath sounds Heart: tachycardia.  Abdomen: soft, mildly distended over LUQ; hypoactive bowel sounds; TTP over epigastric and LUQ region; negative Murphy's sign; minimal guarding; dullness to percussion of LUQ, and LLQ Back: no CVA tenderness Extremities: no edema; symmetrical with no gross deformities Skin: warm and dry Neurologic: normal gait Psychological: alert and cooperative; normal mood and affect   ASSESSMENT & PLAN:  1. Epigastric pain   2. LUQ pain   3. Tachycardia     Urine dip and x-ray deferred.   Recommending further evaluation and management in the ED for abdominal pain,  tachycardia, night sweats, and weakness x 2 weeks.  Patient aware and in agreement.  Declines transport.  Will go by private vehicle next door to [facility].     Lestine Box, PA-C 07/22/18 1223

## 2018-07-22 NOTE — ED Provider Notes (Signed)
[facility] EMERGENCY DEPARTMENT Provider Note   CSN: [id number] Arrival date & time: 07/22/18  1225    History   Chief Complaint Chief Complaint  Patient presents with   Abdominal Pain   Tachycardia    HPI Zachary Bennett is a 84 y.o. male.     HPI Presents with abdominal pain. Onset was about 2 weeks ago, but over the past week patient has had worsening pain, focally in the upper abdomen, and left upper quadrant. Patient has a history of bilateral inguinal hernias, has no pain in his lower abdomen, but with this concern he went to his surgeon first, today. He had evaluation here which was generally reassuring, but with upper abdominal pain he was sent to urgent care, and from that facility he was sent here. Patient denies fever, chills, notes early satiety in addition to anorexia, abdominal pain, and improvement with defecation. Pain is sore, severe, tight, currently 5/10, better at rest. Past Medical History:  Diagnosis Date   Abdominal hernia    High cholesterol    Thrombocytopenia [facility])     Patient Active Problem List   Diagnosis Date Noted   Thrombocytopenia ([place]) 06/04/2016    History reviewed. No pertinent surgical history.      Home Medications    Prior to Admission medications   Medication Sig Start Date End Date Taking? Authorizing Provider  pravastatin (PRAVACHOL) 40 MG tablet  04/06/18   [provider]    Family History History reviewed. No pertinent family history.  Social History Social History   Tobacco Use   Smoking status: Current Every Day Smoker   Smokeless tobacco: Never Used  Substance Use Topics   Alcohol use: Not on file   Drug use: Not on file     Allergies   Patient has no known allergies.   Review of Systems Review of Systems  Constitutional:       Per HPI, otherwise negative  HENT:       Per HPI, otherwise negative  Respiratory:       Per HPI, otherwise negative    Cardiovascular:       Per HPI, otherwise negative  Gastrointestinal: Negative for vomiting.  Endocrine:       Negative aside from HPI  Genitourinary:       Neg aside from HPI   Musculoskeletal:       Per HPI, otherwise negative  Skin: Negative.   Neurological: Negative for syncope.     Physical Exam Updated Vital Signs BP (!) 105/58    Pulse 91    Temp 97.8 F (36.6 C) (Oral)    Resp (!) 26    Wt 71.2 kg    SpO2 96%    BMI 27.81 kg/m   Physical Exam Vitals signs and nursing note reviewed.  Constitutional:      General: He is not in acute distress.    Appearance: He is well-developed.  HENT:     Head: Normocephalic and atraumatic.  Eyes:     Conjunctiva/sclera: Conjunctivae normal.  Cardiovascular:     Rate and Rhythm: Normal rate and regular rhythm.  Pulmonary:     Effort: Pulmonary effort is normal. No respiratory distress.     Breath sounds: No stridor.  Abdominal:     General: There is no distension.     Tenderness: There is abdominal tenderness in the epigastric area and left upper quadrant.     Comments: No lower abdominal pain, deformity.  Skin:  General: Skin is warm and dry.  Neurological:     Mental Status: He is alert and oriented to person, place, and time.      ED Treatments / Results  Labs (all labs ordered are listed, but only abnormal results are displayed) Labs Reviewed  COMPREHENSIVE METABOLIC PANEL - Abnormal; Notable for the following components:      Result Value   Sodium 134 (*)    Chloride 97 (*)    CO2 21 (*)    Glucose, Bld 102 (*)    Creatinine, Ser 1.65 (*)    Calcium 8.8 (*)    AST 93 (*)    Alkaline Phosphatase 301 (*)    Total Bilirubin 4.9 (*)    GFR calc non Af Amer 38 (*)    GFR calc Af Amer 44 (*)    Anion gap 16 (*)    All other components within normal limits  CBC WITH DIFFERENTIAL/PLATELET - Abnormal; Notable for the following components:   WBC 2.9 (*)    Hemoglobin 12.4 (*)    HCT 38.8 (*)    Platelets 49  (*)    All other components within normal limits  URINALYSIS, ROUTINE W REFLEX MICROSCOPIC - Abnormal; Notable for the following components:   Color, Urine AMBER (*)    APPearance HAZY (*)    Hgb urine dipstick SMALL (*)    Bilirubin Urine SMALL (*)    Ketones, ur 5 (*)    Protein, ur 100 (*)    Bacteria, UA RARE (*)    All other components within normal limits  LIPASE, BLOOD    EKG EKG Interpretation  Date/Time:  Wednesday July 22 2018 12:44:02 EDT Ventricular Rate:  106 PR Interval:    QRS Duration: 107 QT Interval:  335 QTC Calculation: 445 R Axis:   78 Text Interpretation:  Sinus tachycardia with irregular rate Low voltage, precordial leads Abnormal ekg Confirmed by Carmin Muskrat [id number]) on 07/22/2018 1:04:06 PM   Radiology Ct Abdomen Pelvis W Contrast  Addendum Date: 07/22/2018   ADDENDUM REPORT: 07/22/2018 16:10 ADDENDUM: The liver does not appear to be significantly abnormal, but given the presence of severe splenomegaly and porta hepatis adenopathy, severe hepatocellular disease or early hepatic cirrhosis cannot be excluded. Correlation with liver function tests is recommended. Electronically Signed   By: Marijo Conception M.D.   On: 07/22/2018 16:10   Result Date: 07/22/2018 CLINICAL DATA:  Generalized abdominal pain. EXAM: CT ABDOMEN AND PELVIS WITH CONTRAST TECHNIQUE: Multidetector CT imaging of the abdomen and pelvis was performed using the standard protocol following bolus administration of intravenous contrast. CONTRAST:  145mL OMNIPAQUE IOHEXOL 300 MG/ML  SOLN COMPARISON:  CT scan of December 30, 2008. FINDINGS: Lower chest: Minimal right pleural effusion is noted with adjacent subsegmental atelectasis. Hepatobiliary: Minimal cholelithiasis is noted with gallbladder wall thickening. No biliary dilatation is noted. The liver is unremarkable. Pancreas: Unremarkable. No pancreatic ductal dilatation or surrounding inflammatory changes. Spleen: There is interval development of  severe splenomegaly without focal abnormality. Adrenals/Urinary Tract: Adrenal glands are unremarkable. Kidneys are normal, without renal calculi, focal lesion, or hydronephrosis. Bladder is unremarkable. Stomach/Bowel: The stomach appears normal. There is no evidence of bowel obstruction or inflammation. The appendix is not clearly visualized. Vascular/Lymphatic: Atherosclerosis of abdominal aorta is noted. 2.4 cm fusiform right common iliac artery aneurysm is noted. Adenopathy is noted in the porta hepatis region as well as in the periaortic region. Porta hepatis adenopathy measures 4.7 x 2.6 cm. 12 mm right periaortic  lymph node is noted. Reproductive: Stable severe enlargement of prostate gland is noted. Other: Bilateral inguinal hernias are noted which contain loops of small bowel, but does not result in obstruction or incarceration. Musculoskeletal: No acute or significant osseous findings. IMPRESSION: Minimal cholelithiasis is noted with gallbladder wall thickening. Possible cholecystitis cannot be excluded. HIDA scan may be performed for further evaluation. Severe splenomegaly is noted with significantly enlarged adenopathy in the porta hepatis and periaortic regions. This is concerning for lymphoma or other malignancy and clinical correlation is recommended. Stable severe enlargement of prostate gland is noted. Mild bilateral inguinal hernias are noted which contain loops of small bowel, but does not result in incarceration or obstruction. 2.4 cm fusiform right common iliac artery aneurysm. Aortic Atherosclerosis (ICD10-I70.0). Electronically Signed: By: Marijo Conception M.D. On: 07/22/2018 15:51    Procedures Procedures (including critical care time)  Medications Ordered in ED Medications  0.9 %  sodium chloride infusion ( Intravenous New Bag/Given 07/22/18 1446)  ondansetron (ZOFRAN) injection 4 mg (4 mg Intravenous Given 07/22/18 1446)  iohexol (OMNIPAQUE) 300 MG/ML solution 100 mL (100 mLs  Intravenous Contrast Given 07/22/18 1527)     Initial Impression / Assessment and Plan / ED Course  I have reviewed the triage vital signs and the nursing notes.  Pertinent labs & imaging results that were available during my care of the patient were reviewed by me and considered in my medical decision making (see chart for details).    Reviewed the patient's urgent care documentation including concern for tachycardia, tachypnea, abdominal pain.    4:49 PM On repeat exam patient is awake and alert. I reviewed the labs, CT, and discussed findings with him. Findings most notable for hyperbilirubinemia, evidence for hepatic injury, and with concern for this, with consideration of differential including malignancy versus hepatobiliary dysfunction versus cholecystitis, the patient will require admission. Patient is afebrile, awake and alert, and pain is epigastric and left-sided, primarily right-sided, and with negative Murphy sign, lower suspicion for acute cholecystitis.  Patient also has findings concerning for acute kidney injury, with a creatinine 1.6, substantially greater than baseline. However, as above, patient requires admission for further monitoring, management.  Final Clinical Impressions(s) / ED Diagnoses   Final diagnoses:  Epigastric pain  Hyperbilirubinemia  AKI (acute kidney injury) ([place])     Carmin Muskrat, MD 07/22/18 1652

## 2018-07-22 NOTE — ED Notes (Signed)
Patient refuses transport to ED by [facility] staff, pt driving his personal vehicle to ED valet parking for continuity of care.

## 2018-07-22 NOTE — Discharge Instructions (Signed)
Recommending further evaluation and management in the ED for abdominal pain, tachycardia, night sweats, and weakness x 2 weeks.  Patient aware and in agreement.  Declines transport.  Will go by private vehicle next door to [facility]

## 2018-07-22 NOTE — H&P (Addendum)
History and Physical  Zachary Bennett KGM:[id number] DOB: Aug 19, 1934 DOA: 07/22/2018  Referring physician: EDP PCP: Zachary Seashore, MD   Chief Complaint: ab pain  HPI: Zachary Bennett is a 84 y.o. male   H/o HLD, thrombocytopenia sent from general surgery Dr Zachary Bennett office to ER for evaluation of abdominal pain. Patient reports has been having ongoing ab pain, epigastric and left side poor appetite, has not been eating much for a week, night sweats, no fever, no n/v, no weight loss. He is generally health until 2-3 weeks ago, he started to feel unwell and progressive left sided ab pain, not eating much. He denies cough, sob. Denies signs of bleeding, denies enlarged glands.  ED course: vital signs are stable,  labs significant for wbc 2.9, plt 49, cr 1.65, tbili 4.9, ast 93, alk phos 301.  EKG: Independently reviewed. Sinus arrhythmia with irregular rate, no acute st/t changes. CT Ab "Severe splenomegaly is noted with significantly enlarged adenopathy in the porta hepatis and periaortic regions. This is concerning for lymphoma or other malignancy and clinical correlation is recommended." Minimal cholelithiasis is noted with gallbladder wall thickening. Possible cholecystitis cannot be excluded. HIDA scan may be performed for further evaluation." he is given hydration , hospitalist called to admit the patient.  Review of Systems:  Detail per HPI, Review of systems are otherwise negative  Past Medical History:  Diagnosis Date   Abdominal hernia    High cholesterol    Thrombocytopenia ([place])    History reviewed. No pertinent surgical history. Social History:  reports that he has been smoking. He has never used smokeless tobacco. No history on file for alcohol and drug. Patient lives at home with wife & is able to participate in activities of daily living independently   No Known Allergies  History reviewed. No pertinent family history.    Prior to Admission medications     Medication Sig Start Date End Date Taking? Authorizing Provider  pravastatin (PRAVACHOL) 40 MG tablet  04/06/18   [provider]    Physical Exam: BP (!) 105/58    Pulse 91    Temp 97.8 F (36.6 C) (Oral)    Resp (!) 26    Wt 71.2 kg    SpO2 96%    BMI 27.81 kg/m   General:  Appear younger than stated age, NAD Eyes: PERRL ENT: unremarkable Neck: supple, no JVD Cardiovascular: RRR Respiratory: CTABL Abdomen: mild epigastric tenderness, LUQ fullness and tenderness, no guarding, no rebound,  positive bowel sounds Skin: no rash Musculoskeletal:  No edema Psychiatric: calm/cooperative Neurologic: no focal findings            Labs on Admission:  Basic Metabolic Panel: Recent Labs  Lab 07/22/18 1302  NA 134*  K 4.4  CL 97*  CO2 21*  GLUCOSE 102*  BUN 23  CREATININE 1.65*  CALCIUM 8.8*   Liver Function Tests: Recent Labs  Lab 07/22/18 1302  AST 93*  ALT 40  ALKPHOS 301*  BILITOT 4.9*  PROT 6.8  ALBUMIN 3.7   Recent Labs  Lab 07/22/18 1302  LIPASE 44   No results for input(s): AMMONIA in the last 168 hours. CBC: Recent Labs  Lab 07/22/18 1302  WBC 2.9*  NEUTROABS 1.8  HGB 12.4*  HCT 38.8*  MCV 82.9  PLT 49*   Cardiac Enzymes: No results for input(s): CKTOTAL, CKMB, CKMBINDEX, TROPONINI in the last 168 hours.  BNP (last 3 results) No results for input(s): BNP in the last 8760 hours.  ProBNP (  last 3 results) No results for input(s): PROBNP in the last 8760 hours.  CBG: No results for input(s): GLUCAP in the last 168 hours.  Radiological Exams on Admission: Ct Abdomen Pelvis W Contrast  Addendum Date: 07/22/2018   ADDENDUM REPORT: 07/22/2018 16:10 ADDENDUM: The liver does not appear to be significantly abnormal, but given the presence of severe splenomegaly and porta hepatis adenopathy, severe hepatocellular disease or early hepatic cirrhosis cannot be excluded. Correlation with liver function tests is recommended. Electronically Signed    By: Marijo Conception M.D.   On: 07/22/2018 16:10   Result Date: 07/22/2018 CLINICAL DATA:  Generalized abdominal pain. EXAM: CT ABDOMEN AND PELVIS WITH CONTRAST TECHNIQUE: Multidetector CT imaging of the abdomen and pelvis was performed using the standard protocol following bolus administration of intravenous contrast. CONTRAST:  13m OMNIPAQUE IOHEXOL 300 MG/ML  SOLN COMPARISON:  CT scan of December 30, 2008. FINDINGS: Lower chest: Minimal right pleural effusion is noted with adjacent subsegmental atelectasis. Hepatobiliary: Minimal cholelithiasis is noted with gallbladder wall thickening. No biliary dilatation is noted. The liver is unremarkable. Pancreas: Unremarkable. No pancreatic ductal dilatation or surrounding inflammatory changes. Spleen: There is interval development of severe splenomegaly without focal abnormality. Adrenals/Urinary Tract: Adrenal glands are unremarkable. Kidneys are normal, without renal calculi, focal lesion, or hydronephrosis. Bladder is unremarkable. Stomach/Bowel: The stomach appears normal. There is no evidence of bowel obstruction or inflammation. The appendix is not clearly visualized. Vascular/Lymphatic: Atherosclerosis of abdominal aorta is noted. 2.4 cm fusiform right common iliac artery aneurysm is noted. Adenopathy is noted in the porta hepatis region as well as in the periaortic region. Porta hepatis adenopathy measures 4.7 x 2.6 cm. 12 mm right periaortic lymph node is noted. Reproductive: Stable severe enlargement of prostate gland is noted. Other: Bilateral inguinal hernias are noted which contain loops of small bowel, but does not result in obstruction or incarceration. Musculoskeletal: No acute or significant osseous findings. IMPRESSION: Minimal cholelithiasis is noted with gallbladder wall thickening. Possible cholecystitis cannot be excluded. HIDA scan may be performed for further evaluation. Severe splenomegaly is noted with significantly enlarged adenopathy in the  porta hepatis and periaortic regions. This is concerning for lymphoma or other malignancy and clinical correlation is recommended. Stable severe enlargement of prostate gland is noted. Mild bilateral inguinal hernias are noted which contain loops of small bowel, but does not result in incarceration or obstruction. 2.4 cm fusiform right common iliac artery aneurysm. Aortic Atherosclerosis (ICD10-I70.0). Electronically Signed: By: JMarijo ConceptionM.D. On: 07/22/2018 15:51      Assessment/Plan Present on Admission:  AKI (acute kidney injury) ([facility]  Splenomegaly/night sweats/early satiety/thrombocytopenia Concerns for lymphoma Case discussed with hematology/ oncology Dr GLindi Adiewho recommend spleen biopsy to rule out lymphoma  transfuse platelet prior to spleen biopsy. IR consulted for spleen biopsy, keep npo aftermidnight, will need to coordinate with IR regarding platelet transfusion.  Pancytopenia Wbc 2.9, hgb 12.4, plt 49 Save blood smear Check indirect bilirubin Hematology/oncology consulted  Elevated bilirubin/mild elevated AST/ alk phos -Ct ab " Minimal cholelithiasis is noted with gallbladder wall thickening. Possible cholecystitis cannot be excluded. " The liver does not appear to be significantly abnormal, but given the presence of severe splenomegaly and porta hepatis adenopathy, severe hepatocellular disease or early hepatic cirrhosis cannot be excluded. Correlation with liver function tests is recommended." -Patient does has epigastric tenderness, but no RUQ pain, no n/v, no fever -Will check fractionated bilirubin, repeat lft in am -Will get ab u[place]-May need HIDa scan, pending ab  [place] result  AKI:  no obstruction on ct scan,  ua + ketone, urine specific gravity 1.027, rare bacteria, he denies urinary symptom He reports poor appetite, AKI likely prerenal due to dehydration Continue hydration, repeat bmp in am Renal dosing meds  HLD: hold statin for now due to elevated  lft  Stable severe enlargement of prostate gland is noted on CT ab, denies urinary symptom  Mild bilateral inguinal hernias are noted which contain loops of small bowel, but does not result in incarceration or obstruction.  2.4 cm fusiform right common iliac artery aneurysm.  DVT prophylaxis: scd's   Consultants: hematology/oncology  Code Status: full   Family Communication:  Patient , he declined my offer to call his son or his wife  Disposition Plan: admit to med surg  Time spent: 35mns  FFlorencia ReasonsMD, PhD [facility] Pager [phone] 7PM-7AM, please contact night-coverage at [email or web address], password [facility]

## 2018-07-22 NOTE — ED Triage Notes (Signed)
Pt here for further eval from [facility] and PCP. Abdominal pain x2 weeks, tachy at [facility].

## 2018-07-22 NOTE — Progress Notes (Signed)
Zachary Bennett is a 84 y.o. male patient admitted from ED awake, alert - oriented  X 4 - no acute distress noted.  VSS - Blood pressure 107/81, pulse (!) 101, temperature 97.9 F (36.6 C), temperature source Oral, resp. rate 18, weight 71.2 kg, SpO2 100 %.    IV in place, occlusive dsg intact without redness.  Orientation to room, and floor completed with information packet given to patient/family.  Patient declined safety video at this time.  Admission INP armband ID verified with patient/family, and in place.   SR up x 2, fall assessment complete, with patient and family able to verbalize understanding of risk associated with falls, and verbalized understanding to call nsg before up out of bed.  Call light within reach, patient able to voice, and demonstrate understanding.  Skin, clean-dry- intact without evidence of bruising, or skin tears.   No evidence of skin break down noted on exam.     Will cont to eval and treat per MD orders.  Tama High, RN 07/22/2018 6:45 PM

## 2018-07-22 NOTE — ED Triage Notes (Signed)
Pt states she has stomach pain. Pt states she was at the [facility] center to see Dr Redmond Pulling. He told him that his heart rate was up. Pt thinks he needs blood work find out what's wrong with his stomach this has been going on for 2 weeks.pt has night sweats as well.

## 2018-07-22 NOTE — ED Notes (Signed)
ED TO INPATIENT HANDOFF REPORT  ED Nurse Name and Phone #:   S Name/Age/Gender Rexene Edison 84 y.o. male Room/Bed: 008C/008C  Code Status   Code Status: Not on file  Home/SNF/Other Home Patient oriented to: self, place, time and situation Is this baseline? Yes   Triage Complete: Triage complete  Chief Complaint Abdominal Pain; LUQ Pain; Tachy  Triage Note Pt here for further eval from [facility] and PCP. Abdominal pain x2 weeks, tachy at [facility].    Allergies No Known Allergies  Level of Care/Admitting Diagnosis ED Disposition    ED Disposition Condition [facility] Area: [place] [[id number]]  Level of Care: Med-Surg [16]  Covid Evaluation: N/A  Diagnosis: AKI (acute kidney injury) [facility]) [[id number]]  Admitting Physician: Florencia Reasons [[id number]]  Attending Physician: Florencia Reasons [[id number]]  Estimated length of stay: past midnight tomorrow  Certification:: I certify this patient will need inpatient services for at least 2 midnights  PT Class (Do Not Modify): Inpatient [101]  PT Acc Code (Do Not Modify): Private [1]       B Medical/Surgery History Past Medical History:  Diagnosis Date  . Abdominal hernia   . High cholesterol   . Thrombocytopenia ([place])    History reviewed. No pertinent surgical history.   A IV Location/Drains/Wounds Patient Lines/Drains/Airways Status   Active Line/Drains/Airways    Name:   Placement date:   Placement time:   Site:   Days:   Peripheral IV 07/22/18 Left Antecubital   07/22/18    1315    Antecubital   less than 1          Intake/Output Last 24 hours No intake or output data in the 24 hours ending 07/22/18 1751  Labs/Imaging Results for orders placed or performed during the hospital encounter of 07/22/18 (from the past 48 hour(s))  Comprehensive metabolic panel     Status: Abnormal   Collection Time: 07/22/18  1:02 PM  Result Value Ref Range   Sodium 134 (L) 135 - 145 mmol/L   Potassium 4.4 3.5 - 5.1  mmol/L   Chloride 97 (L) 98 - 111 mmol/L   CO2 21 (L) 22 - 32 mmol/L   Glucose, Bld 102 (H) 70 - 99 mg/dL   BUN 23 8 - 23 mg/dL   Creatinine, Ser 1.65 (H) 0.61 - 1.24 mg/dL   Calcium 8.8 (L) 8.9 - 10.3 mg/dL   Total Protein 6.8 6.5 - 8.1 g/dL   Albumin 3.7 3.5 - 5.0 g/dL   AST 93 (H) 15 - 41 U/L   ALT 40 0 - 44 U/L   Alkaline Phosphatase 301 (H) 38 - 126 U/L   Total Bilirubin 4.9 (H) 0.3 - 1.2 mg/dL    Comment: ICTERUS AT THIS LEVEL MAY AFFECT RESULT   GFR calc non Af Amer 38 (L) >60 mL/min   GFR calc Af Amer 44 (L) >60 mL/min   Anion gap 16 (H) 5 - 15    Comment: Performed at [facility], [address] [place], [place] [address]  Lipase, blood     Status: None   Collection Time: 07/22/18  1:02 PM  Result Value Ref Range   Lipase 44 11 - 51 U/L    Comment: Performed at [facility] [address], [place], [place] [address]  CBC WITH DIFFERENTIAL     Status: Abnormal   Collection Time: 07/22/18  1:02 PM  Result Value Ref Range   WBC 2.9 (  L) 4.0 - 10.5 K/uL   RBC 4.68 4.22 - 5.81 MIL/uL   Hemoglobin 12.4 (L) 13.0 - 17.0 g/dL   HCT 38.8 (L) 39.0 - 52.0 %   MCV 82.9 80.0 - 100.0 fL   MCH 26.5 26.0 - 34.0 pg   MCHC 32.0 30.0 - 36.0 g/dL   RDW 15.0 11.5 - 15.5 %   Platelets 49 (L) 150 - 400 K/uL    Comment: REPEATED TO VERIFY PLATELET COUNT CONFIRMED BY SMEAR SPECIMEN CHECKED FOR CLOTS Immature Platelet Fraction may be clinically indicated, consider ordering this additional test [id number]    nRBC 0.0 0.0 - 0.2 %   Neutrophils Relative % 64 %   Neutro Abs 1.8 1.7 - 7.7 K/uL   Lymphocytes Relative 25 %   Lymphs Abs 0.7 0.7 - 4.0 K/uL   Monocytes Relative 9 %   Monocytes Absolute 0.3 0.1 - 1.0 K/uL   Eosinophils Relative 1 %   Eosinophils Absolute 0.0 0.0 - 0.5 K/uL   Basophils Relative 1 %   Basophils Absolute 0.0 0.0 - 0.1 K/uL   Immature Granulocytes 0 %   Abs Immature Granulocytes 0.01 0.00 - 0.07 K/uL    Comment: Performed at [facility], [address] [place], [place] [address]  Urinalysis, Routine w reflex microscopic     Status: Abnormal   Collection Time: 07/22/18  1:08 PM  Result Value Ref Range   Color, Urine AMBER (A) YELLOW    Comment: BIOCHEMICALS MAY BE AFFECTED BY COLOR   APPearance HAZY (A) CLEAR   Specific Gravity, Urine 1.027 1.005 - 1.030   pH 5.0 5.0 - 8.0   Glucose, UA NEGATIVE NEGATIVE mg/dL   Hgb urine dipstick SMALL (A) NEGATIVE   Bilirubin Urine SMALL (A) NEGATIVE   Ketones, ur 5 (A) NEGATIVE mg/dL   Protein, ur 100 (A) NEGATIVE mg/dL   Nitrite NEGATIVE NEGATIVE   Leukocytes,Ua NEGATIVE NEGATIVE   RBC / HPF 0-5 0 - 5 RBC/hpf   WBC, UA 0-5 0 - 5 WBC/hpf   Bacteria, UA RARE (A) NONE SEEN   Squamous Epithelial / LPF 0-5 0 - 5   Mucus PRESENT    Hyaline Casts, UA PRESENT     Comment: Performed at [facility], [address], [place], [place] [address]   Ct Abdomen Pelvis W Contrast  Addendum Date: 07/22/2018   ADDENDUM REPORT: 07/22/2018 16:10 ADDENDUM: The liver does not appear to be significantly abnormal, but given the presence of severe splenomegaly and porta hepatis adenopathy, severe hepatocellular disease or early hepatic cirrhosis cannot be excluded. Correlation with liver function tests is recommended. Electronically Signed   By: Marijo Conception M.D.   On: 07/22/2018 16:10   Result Date: 07/22/2018 CLINICAL DATA:  Generalized abdominal pain. EXAM: CT ABDOMEN AND PELVIS WITH CONTRAST TECHNIQUE: Multidetector CT imaging of the abdomen and pelvis was performed using the standard protocol following bolus administration of intravenous contrast. CONTRAST:  190mL OMNIPAQUE IOHEXOL 300 MG/ML  SOLN COMPARISON:  CT scan of December 30, 2008. FINDINGS: Lower chest: Minimal right pleural effusion is noted with adjacent subsegmental atelectasis. Hepatobiliary: Minimal cholelithiasis is noted with gallbladder wall thickening. No biliary dilatation is noted. The liver is unremarkable. Pancreas:  Unremarkable. No pancreatic ductal dilatation or surrounding inflammatory changes. Spleen: There is interval development of severe splenomegaly without focal abnormality. Adrenals/Urinary Tract: Adrenal glands are unremarkable. Kidneys are normal, without renal calculi, focal lesion, or hydronephrosis. Bladder is unremarkable. Stomach/Bowel: The stomach appears  normal. There is no evidence of bowel obstruction or inflammation. The appendix is not clearly visualized. Vascular/Lymphatic: Atherosclerosis of abdominal aorta is noted. 2.4 cm fusiform right common iliac artery aneurysm is noted. Adenopathy is noted in the porta hepatis region as well as in the periaortic region. Porta hepatis adenopathy measures 4.7 x 2.6 cm. 12 mm right periaortic lymph node is noted. Reproductive: Stable severe enlargement of prostate gland is noted. Other: Bilateral inguinal hernias are noted which contain loops of small bowel, but does not result in obstruction or incarceration. Musculoskeletal: No acute or significant osseous findings. IMPRESSION: Minimal cholelithiasis is noted with gallbladder wall thickening. Possible cholecystitis cannot be excluded. HIDA scan may be performed for further evaluation. Severe splenomegaly is noted with significantly enlarged adenopathy in the porta hepatis and periaortic regions. This is concerning for lymphoma or other malignancy and clinical correlation is recommended. Stable severe enlargement of prostate gland is noted. Mild bilateral inguinal hernias are noted which contain loops of small bowel, but does not result in incarceration or obstruction. 2.4 cm fusiform right common iliac artery aneurysm. Aortic Atherosclerosis (ICD10-I70.0). Electronically Signed: By: Marijo Conception M.D. On: 07/22/2018 15:51    Pending Labs Unresulted Labs (From admission, onward)   None      Vitals/Pain Today's Vitals   07/22/18 1500 07/22/18 1530 07/22/18 1630 07/22/18 1730  BP: 114/60 (!) 109/51 (!)  105/58 121/75  Pulse: 97  91   Resp: 13 20 (!) 26 20  Temp:      TempSrc:      SpO2: 97%  96%   Weight:      PainSc:        Isolation Precautions No active isolations  Medications Medications  0.9 %  sodium chloride infusion ( Intravenous New Bag/Given 07/22/18 1446)  ondansetron (ZOFRAN) injection 4 mg (4 mg Intravenous Given 07/22/18 1446)  iohexol (OMNIPAQUE) 300 MG/ML solution 100 mL (100 mLs Intravenous Contrast Given 07/22/18 1527)    Mobility walks Low fall risk   Focused Assessments Cardiac Assessment Handoff:    No results found for: CKTOTAL, CKMB, CKMBINDEX, TROPONINI No results found for: DDIMER Does the Patient currently have chest pain? No      R Recommendations: See Admitting Provider Note  Report given to:   Additional Notes:

## 2018-07-23 ENCOUNTER — Inpatient Hospital Stay (HOSPITAL_COMMUNITY): Payer: Medicare Other

## 2018-07-23 DIAGNOSIS — R590 Localized enlarged lymph nodes: Secondary | ICD-10-CM

## 2018-07-23 DIAGNOSIS — D61818 Other pancytopenia: Secondary | ICD-10-CM

## 2018-07-23 DIAGNOSIS — R161 Splenomegaly, not elsewhere classified: Secondary | ICD-10-CM

## 2018-07-23 LAB — LACTATE DEHYDROGENASE: LDH: 486 U/L — ABNORMAL HIGH (ref 98–192)

## 2018-07-23 LAB — CBC WITH DIFFERENTIAL/PLATELET
Abs Immature Granulocytes: 0.01 10*3/uL (ref 0.00–0.07)
Basophils Absolute: 0 10*3/uL (ref 0.0–0.1)
Basophils Relative: 1 %
Eosinophils Absolute: 0 10*3/uL (ref 0.0–0.5)
Eosinophils Relative: 1 %
HCT: 30.2 % — ABNORMAL LOW (ref 39.0–52.0)
Hemoglobin: 9.8 g/dL — ABNORMAL LOW (ref 13.0–17.0)
Immature Granulocytes: 1 %
Lymphocytes Relative: 38 %
Lymphs Abs: 0.8 10*3/uL (ref 0.7–4.0)
MCH: 26.6 pg (ref 26.0–34.0)
MCHC: 32.5 g/dL (ref 30.0–36.0)
MCV: 82.1 fL (ref 80.0–100.0)
Monocytes Absolute: 0.3 10*3/uL (ref 0.1–1.0)
Monocytes Relative: 16 %
Neutro Abs: 0.9 10*3/uL — ABNORMAL LOW (ref 1.7–7.7)
Neutrophils Relative %: 43 %
Platelets: 47 10*3/uL — ABNORMAL LOW (ref 150–400)
RBC: 3.68 MIL/uL — ABNORMAL LOW (ref 4.22–5.81)
RDW: 15.2 % (ref 11.5–15.5)
WBC: 2.1 10*3/uL — ABNORMAL LOW (ref 4.0–10.5)
nRBC: 0 % (ref 0.0–0.2)

## 2018-07-23 LAB — COMPREHENSIVE METABOLIC PANEL
ALT: 33 U/L (ref 0–44)
AST: 69 U/L — ABNORMAL HIGH (ref 15–41)
Albumin: 2.8 g/dL — ABNORMAL LOW (ref 3.5–5.0)
Alkaline Phosphatase: 225 U/L — ABNORMAL HIGH (ref 38–126)
Anion gap: 7 (ref 5–15)
BUN: 20 mg/dL (ref 8–23)
CO2: 24 mmol/L (ref 22–32)
Calcium: 8.3 mg/dL — ABNORMAL LOW (ref 8.9–10.3)
Chloride: 103 mmol/L (ref 98–111)
Creatinine, Ser: 1.25 mg/dL — ABNORMAL HIGH (ref 0.61–1.24)
GFR calc Af Amer: >60 mL/min (ref >60)
GFR calc non Af Amer: 54 mL/min — ABNORMAL LOW (ref >60)
Glucose, Bld: 86 mg/dL (ref 70–99)
Potassium: 4 mmol/L (ref 3.5–5.1)
Sodium: 134 mmol/L — ABNORMAL LOW (ref 135–145)
Total Bilirubin: 3.3 mg/dL — ABNORMAL HIGH (ref 0.3–1.2)
Total Protein: 4.8 g/dL — ABNORMAL LOW (ref 6.5–8.1)

## 2018-07-23 LAB — PROTIME-INR
INR: 1.1 (ref 0.8–1.2)
Prothrombin Time: 14.3 seconds (ref 11.4–15.2)

## 2018-07-23 LAB — BILIRUBIN, DIRECT: Bilirubin, Direct: 1.7 mg/dL — ABNORMAL HIGH (ref 0.0–0.2)

## 2018-07-23 LAB — LIPID PANEL
Cholesterol: 124 mg/dL (ref 0–200)
HDL: <10 mg/dL — ABNORMAL LOW (ref >40)
Triglycerides: 202 mg/dL — ABNORMAL HIGH (ref <150)
VLDL: 40 mg/dL (ref 0–40)

## 2018-07-23 LAB — SAVE SMEAR(SSMR), FOR PROVIDER SLIDE REVIEW

## 2018-07-23 MED ORDER — IOHEXOL 300 MG/ML  SOLN
75.0000 mL | Freq: Once | INTRAMUSCULAR | Status: AC | PRN
  Administered 2018-07-23: 75 mL via INTRAVENOUS

## 2018-07-23 NOTE — Progress Notes (Addendum)
Patient ID: Zachary Bennett, male   DOB: July 04, 1934, 84 y.o.   MRN: [id number]   Request received for "splenic biopsy"  CT yesterday:  IMPRESSION: Minimal cholelithiasis is noted with gallbladder wall thickening. Possible cholecystitis cannot be excluded. HIDA scan may be performed for further evaluation. Severe splenomegaly is noted with significantly enlarged adenopathy in the porta hepatis and periaortic regions. This is concerning for lymphoma or other malignancy and clinical correlation is Recommended. Stable severe enlargement of prostate gland is noted. Mild bilateral inguinal hernias are noted which contain loops of small bowel, but does not result in incarceration or obstruction. 2.4 cm fusiform right common iliac artery aneurysm.  Dr Anselm Pancoast did review imaging and chart  We do not commonly perform random splenic biopsies.  Pt with thrombocytopenia-- high risk of bleeding. .  I am suspicous for some chest disease-- we would recommend full imaging work up. Could consider biopsy after all imaging reviewed.  Will inform ordering MD Will cancel biopsy for now and await full work up  Please re order if/when biopsy if needed

## 2018-07-23 NOTE — Progress Notes (Signed)
PROGRESS NOTE  Zachary Bennett ZRA:[id number] DOB: 1934/06/26 DOA: 07/22/2018 PCP: Nicholas Lose, MD   LOS: 1 day   Brief Narrative / Interim history: H/o HLD, thrombocytopenia sent from general surgery Dr Dois Davenport office to ER for evaluation of abdominal pain. Patient reports has been having ongoing ab pain, epigastric and left side poor appetite, has not been eating much for a week, night sweats, no fever, no n/v, no weight loss. He is generally health until 2-3 weeks ago, he started to feel unwell and progressive left sided ab pain, not eating much. He denies cough, sob. Denies signs of bleeding, denies enlarged glands.  Subjective: Seen this morning, patient reports decreased abdominal pain.  He is hungry and wants to eat.  He denies any chest pain, denies any nausea or vomiting.  Assessment & Plan: Active Problems:   AKI (acute kidney injury) ([place])   Elevated LFTs   Epigastric pain   Hyperbilirubinemia   Splenomegaly   Pancytopenia ([facility])   Principal Problem Splenomegaly with pancytopenia -Imaging and symptomatology with night sweats, early society, concerning for lymphoma -Oncology, Dr. Lindi Adie consulted and appreciate input -Discussed with IR this morning, they do not perform random spleen biopsies  Active Problems Elevated bilirubin/mild elevation of LFTs -CT abdomen and pelvis showed minimal cholelithiasis with gallbladder wall thickening.  Right upper quadrant ultrasound without significant abnormalities, liver parenchymal echogenicity is normal, gallbladder is nondilated without pericholecystic fluid or sonographic Murphy sign, no evidence of biliary ductal dilatation.  He may have a degree of chronic cholecystitis but his symptoms do not appear acute  Acute kidney injury -Likely prerenal due to dehydration in the setting of poor p.o. intake, he was placed on IV fluids and creatinine is improved this morning.  Continue fluids for another 24 hours.  Hyperlipidemia -Hold  statin now due to elevated LFTs  Stable severe enlargement of prostate gland is noted on CT ab, denies urinary symptom  Mild bilateral inguinal hernias are noted which contain loops of small bowel, but does not result in incarceration or obstruction.  2.4 cm fusiform right common iliac artery aneurysm.  Scheduled Meds: Continuous Infusions: . sodium chloride 75 mL/hr at 07/23/18 0721   PRN Meds:.zolpidem  DVT prophylaxis: SCDs Code Status: Full code Family Communication: no family at bedside  Disposition Plan: home when ready   Consultants:   Oncology   Procedures:   None   Antimicrobials:  None    Objective: Vitals:   07/22/18 1730 07/22/18 1827 07/22/18 2125 07/23/18 0527  BP: 121/75 107/81 111/60 104/68  Pulse:  (!) 101 90 80  Resp: 20 18    Temp:  97.9 F (36.6 C) 98.3 F (36.8 C) 97.7 F (36.5 C)  TempSrc:  Oral Oral Oral  SpO2:  100% 98% 96%  Weight:        Intake/Output Summary (Last 24 hours) at 07/23/2018 1317 Last data filed at 07/23/2018 0617 Gross per 24 hour  Intake 2171.25 ml  Output 250 ml  Net 1921.25 ml   Filed Weights   07/22/18 1238  Weight: 71.2 kg    Examination:  Constitutional: NAD Eyes: PERRL, lids and conjunctivae normal ENMT: Mucous membranes are moist.  Neck: normal, supple, no masses, no thyromegaly Respiratory: clear to auscultation bilaterally, no wheezing, no crackles. Normal respiratory effort. No accessory muscle use.  Cardiovascular: Regular rate and rhythm, no murmurs / rubs / gallops.  Abdomen: no tenderness. Bowel sounds positive.  Musculoskeletal: no clubbing / cyanosis.  Skin: no rashes Neurologic: CN 2-12 grossly intact.  Strength 5/5 in all 4.  Psychiatric: Normal judgment and insight. Alert and oriented x 3. Normal mood.    Data Reviewed: I have independently reviewed following labs and imaging studies   CBC: Recent Labs  Lab 07/22/18 1302 07/23/18 0303  WBC 2.9* 2.1*  NEUTROABS 1.8 0.9*  HGB  12.4* 9.8*  HCT 38.8* 30.2*  MCV 82.9 82.1  PLT 49* 47*   Basic Metabolic Panel: Recent Labs  Lab 07/22/18 1302 07/23/18 0303  NA 134* 134*  K 4.4 4.0  CL 97* 103  CO2 21* 24  GLUCOSE 102* 86  BUN 23 20  CREATININE 1.65* 1.25*  CALCIUM 8.8* 8.3*   GFR: CrCl cannot be calculated (Unknown ideal weight.). Liver Function Tests: Recent Labs  Lab 07/22/18 1302 07/23/18 0303  AST 93* 69*  ALT 40 33  ALKPHOS 301* 225*  BILITOT 4.9* 3.3*  PROT 6.8 4.8*  ALBUMIN 3.7 2.8*   Recent Labs  Lab 07/22/18 1302  LIPASE 44   No results for input(s): AMMONIA in the last 168 hours. Coagulation Profile: Recent Labs  Lab 07/23/18 0730  INR 1.1   Cardiac Enzymes: No results for input(s): CKTOTAL, CKMB, CKMBINDEX, TROPONINI in the last 168 hours. BNP (last 3 results) No results for input(s): PROBNP in the last 8760 hours. HbA1C: No results for input(s): HGBA1C in the last 72 hours. CBG: No results for input(s): GLUCAP in the last 168 hours. Lipid Profile: Recent Labs    07/23/18 0303  CHOL 124  HDL <10*  LDLCALC NOT CALCULATED  TRIG 202*  CHOLHDL NOT CALCULATED   Thyroid Function Tests: No results for input(s): TSH, T4TOTAL, FREET4, T3FREE, THYROIDAB in the last 72 hours. Anemia Panel: No results for input(s): VITAMINB12, FOLATE, FERRITIN, TIBC, IRON, RETICCTPCT in the last 72 hours. Urine analysis:    Component Value Date/Time   COLORURINE AMBER (A) 07/22/2018 1308   APPEARANCEUR HAZY (A) 07/22/2018 1308   LABSPEC 1.027 07/22/2018 1308   PHURINE 5.0 07/22/2018 1308   GLUCOSEU NEGATIVE 07/22/2018 1308   HGBUR SMALL (A) 07/22/2018 1308   BILIRUBINUR SMALL (A) 07/22/2018 1308   KETONESUR 5 (A) 07/22/2018 1308   PROTEINUR 100 (A) 07/22/2018 1308   UROBILINOGEN 0.2 07/10/2007 1617   NITRITE NEGATIVE 07/22/2018 1308   LEUKOCYTESUR NEGATIVE 07/22/2018 1308   Sepsis Labs: Invalid input(s): PROCALCITONIN, LACTICIDVEN  No results found for this or any previous  visit (from the past 240 hour(s)).    Radiology Studies: Ct Abdomen Pelvis W Contrast  Addendum Date: 07/22/2018   ADDENDUM REPORT: 07/22/2018 16:10 ADDENDUM: The liver does not appear to be significantly abnormal, but given the presence of severe splenomegaly and porta hepatis adenopathy, severe hepatocellular disease or early hepatic cirrhosis cannot be excluded. Correlation with liver function tests is recommended. Electronically Signed   By: Marijo Conception M.D.   On: 07/22/2018 16:10   Result Date: 07/22/2018 CLINICAL DATA:  Generalized abdominal pain. EXAM: CT ABDOMEN AND PELVIS WITH CONTRAST TECHNIQUE: Multidetector CT imaging of the abdomen and pelvis was performed using the standard protocol following bolus administration of intravenous contrast. CONTRAST:  138mL OMNIPAQUE IOHEXOL 300 MG/ML  SOLN COMPARISON:  CT scan of December 30, 2008. FINDINGS: Lower chest: Minimal right pleural effusion is noted with adjacent subsegmental atelectasis. Hepatobiliary: Minimal cholelithiasis is noted with gallbladder wall thickening. No biliary dilatation is noted. The liver is unremarkable. Pancreas: Unremarkable. No pancreatic ductal dilatation or surrounding inflammatory changes. Spleen: There is interval development of severe splenomegaly without focal abnormality. Adrenals/Urinary Tract:  Adrenal glands are unremarkable. Kidneys are normal, without renal calculi, focal lesion, or hydronephrosis. Bladder is unremarkable. Stomach/Bowel: The stomach appears normal. There is no evidence of bowel obstruction or inflammation. The appendix is not clearly visualized. Vascular/Lymphatic: Atherosclerosis of abdominal aorta is noted. 2.4 cm fusiform right common iliac artery aneurysm is noted. Adenopathy is noted in the porta hepatis region as well as in the periaortic region. Porta hepatis adenopathy measures 4.7 x 2.6 cm. 12 mm right periaortic lymph node is noted. Reproductive: Stable severe enlargement of prostate  gland is noted. Other: Bilateral inguinal hernias are noted which contain loops of small bowel, but does not result in obstruction or incarceration. Musculoskeletal: No acute or significant osseous findings. IMPRESSION: Minimal cholelithiasis is noted with gallbladder wall thickening. Possible cholecystitis cannot be excluded. HIDA scan may be performed for further evaluation. Severe splenomegaly is noted with significantly enlarged adenopathy in the porta hepatis and periaortic regions. This is concerning for lymphoma or other malignancy and clinical correlation is recommended. Stable severe enlargement of prostate gland is noted. Mild bilateral inguinal hernias are noted which contain loops of small bowel, but does not result in incarceration or obstruction. 2.4 cm fusiform right common iliac artery aneurysm. Aortic Atherosclerosis (ICD10-I70.0). Electronically Signed: By: Marijo Conception M.D. On: 07/22/2018 15:51   US Abdomen Limited  Result Date: 07/22/2018 CLINICAL DATA:  Elevated liver function tests. EXAM: ULTRASOUND ABDOMEN LIMITED RIGHT UPPER QUADRANT COMPARISON:  None. FINDINGS: Gallbladder: Multiple small less than 1 cm gallstones are seen. The gallbladder is nondilated. Diffuse gallbladder wall thickening is seen measuring up to 6 mm. No evidence of pericholecystic fluid. No sonographic Murphy sign noted by sonographer. Common bile duct: Diameter: 4 mm, within normal limits. Liver: No focal lesion identified. Within normal limits in parenchymal echogenicity. Portal vein is patent on color Doppler imaging with normal direction of blood flow towards the liver. IMPRESSION: Cholelithiasis. Nondilated gallbladder with diffuse wall thickening, 5 without pericholecystic fluid or sonographic Murphy's sign. These findings are nonspecific. Chronic cholecystitis cannot be excluded. No evidence of biliary ductal dilatation. Unremarkable sonographic appearance of liver. Electronically Signed   By: Earle Gell  M.D.   On: 07/22/2018 20:29    Marzetta Board, MD, PhD [facility]  Contact via  [email or web address]  [place] P: [phone]  F: [phone]

## 2018-07-23 NOTE — Progress Notes (Addendum)
HEMATOLOGY-ONCOLOGY PROGRESS NOTE  SUBJECTIVE: [name] is an 84 year old male with past medical history including hyperlipidemia and low-grade ITP.  He was last seen at the cancer center on 06/04/2016 and at that time, he had a platelet count of 102,000 which was consistent with his previously reported lab work.  Work-up at that time was unrevealing except for splenomegaly (900 cm) noted on his abdominal ultrasound.  Observation was recommended.  Patient reports that he has not had any additional blood work since that time.  The patient states that for the past 2 weeks he has been having increased abdominal discomfort.  The patient states that he has a history of a hernia and made an appointment with his general surgeon, Dr. Greer Bennett, for further evaluation.  Pain was thought not to be related to his hernia and he was referred to an urgent care for further evaluation of his abdominal pain.  He was subsequently referred to the emergency room for further evaluation.  In the emergency room, lab work was significant for a white blood cell count of 2.9, ANC 1.8, hemoglobin 12.4, platelet count 49,000, creatinine 1.65, AST 93, ALT 40, alkaline phosphatase 301, and total bilirubin 4.9.  A CT in the abdomen and pelvis demonstrated minimal cholelithiasis with gallbladder wall thickening.  Possible cholecystitis could not be excluded.  The patient also has severe splenomegaly with significantly enlarged adenopathy in the porta hepatis and periaortic regions.  This was concerning for lymphoma or other malignancy.  An addendum to the report states that the liver does not appear to be significantly abnormal but given the presence of severe splenomegaly and porta hepatis adenopathy, severe hepatocellular disease or early hepatic cirrhosis cannot be excluded.  An abdominal ultrasound showed cholelithiasis.  No evidence of biliary ductal dilatation.  There was a nondilated gallbladder with diffuse wall thickening.   There was an unremarkable sonographic appearance of the liver.  Today the patient reports that he has had abdominal discomfort for at least 2 weeks.  It seems to be centered more in the center of his abdomen but occasionally on the left side as well.  He reports that he has had a decreased appetite for the past week.  He is unsure if he has lost weight or not.  He does have some mild fatigue and reports that he had night sweats for about 4 to 5 days where he would wake up drenched in sweat.  Denies headaches and dizziness.  Denies chest pain, shortness of breath, cough.  He denies epistaxis, hemoptysis, hematuria, melena, hematochezia.  Reports that he bruises easily.  Denies petechiae.  Denies nausea, vomiting, constipation, diarrhea.  The patient is married and lives at home with his wife and 1 of his sons.  His other son lives in [place].  The patient reports that he smokes a pack of cigarettes per day and denies alcohol use.  Oncology has been asked to see the patient to make recommendations regarding his splenomegaly and thrombocytopenia.  REVIEW OF SYSTEMS:   Constitutional: Denies fevers, chills.  Reports mild fatigue.  Unsure if he has had a weight loss.  Reports night sweats for to 5 days. Eyes: Denies blurriness of vision Ears, nose, mouth, throat, and face: Denies mucositis or sore throat Respiratory: Denies cough, dyspnea or wheezes Cardiovascular: Denies palpitation, chest discomfort Gastrointestinal: Reports abdominal pain in the center of his abdomen and in the left upper quadrant.  Denies nausea, heartburn or change in bowel habits Skin: Denies abnormal skin rashes  Lymphatics: Denies new lymphadenopathy.  Bruises easily. Neurological:Denies numbness, tingling or new weaknesses Behavioral/Psych: Mood is stable, no new changes  Extremities: No lower extremity edema All other systems were reviewed with the patient and are negative.  I have reviewed the past medical  history, past surgical history, social history and family history with the patient and they are unchanged from previous note.   PHYSICAL EXAMINATION: ECOG PERFORMANCE STATUS: 1 - Symptomatic but completely ambulatory  Vitals:   07/22/18 2125 07/23/18 0527  BP: 111/60 104/68  Pulse: 90 80  Resp:    Temp: 98.3 F (36.8 C) 97.7 F (36.5 C)  SpO2: 98% 96%   Filed Weights   07/22/18 1238  Weight: 156 lb 15.5 oz (71.2 kg)    GENERAL:alert, no distress and comfortable SKIN: skin color, texture, turgor are normal, no rashes or significant lesions EYES: normal, Conjunctiva are pink and non-injected, sclera clear OROPHARYNX:no exudate, no erythema and lips, buccal mucosa, and tongue normal  NECK: supple, thyroid normal size, non-tender, without nodularity LYMPH:  no palpable lymphadenopathy in the cervical, axillary or inguinal LUNGS: clear to auscultation and percussion with normal breathing effort HEART: regular rate & rhythm and no murmurs and no lower extremity edema ABDOMEN: Normal bowel sounds, abdomen soft, diffuse tenderness with light palpation.  Splenomegaly noted. Musculoskeletal:no cyanosis of digits and no clubbing  NEURO: alert & oriented x 3 with fluent speech, no focal motor/sensory deficits  LABORATORY DATA:  I have reviewed the data as listed CMP Latest Ref Rng & Units 07/23/2018 07/22/2018 07/10/2007  Glucose 70 - 99 mg/dL 86 102(H) 98  BUN 8 - 23 mg/dL 20 23 11   Creatinine 0.61 - 1.24 mg/dL 1.25(H) 1.65(H) 1.0  Sodium 135 - 145 mmol/L 134(L) 134(L) 140  Potassium 3.5 - 5.1 mmol/L 4.0 4.4 4.1  Chloride 98 - 111 mmol/L 103 97(L) 107  CO2 22 - 32 mmol/L 24 21(L) -  Calcium 8.9 - 10.3 mg/dL 8.3(L) 8.8(L) -  Total Protein 6.5 - 8.1 g/dL 4.8(L) 6.8 -  Total Bilirubin 0.3 - 1.2 mg/dL 3.3(H) 4.9(H) -  Alkaline Phos 38 - 126 U/L 225(H) 301(H) -  AST 15 - 41 U/L 69(H) 93(H) -  ALT 0 - 44 U/L 33 40 -    Lab Results  Component Value Date   WBC 2.1 (L) 07/23/2018   HGB  9.8 (L) 07/23/2018   HCT 30.2 (L) 07/23/2018   MCV 82.1 07/23/2018   PLT 47 (L) 07/23/2018   NEUTROABS 0.9 (L) 07/23/2018    ASSESSMENT AND PLAN: This is an 84 year old male with a 2-week history of abdominal pain who has pancytopenia and an enlarged spleen.  Imaging is concerning for lymphoma versus another malignancy.  Recommendation has been made to perform a biopsy of the spleen, but this cannot be done due to the risk of bleeding and because there are no focal splenic lesions.  -Recommend CT of the chest to evaluate for other potential biopsy sites. -If no additional sites are identified on the CT of the chest, may need to request consult from general surgery.  Mikey Bussing, DNP, AGPCNP-BC, AOCNP  Attending Note  I personally saw the patient, reviewed the chart and examined the patient. The plan of care was discussed with the patient and the admitting team. I agree with the assessment and plan as documented above.  Thrombocytopenia with splenomegaly: Patient had symptoms of fatigue and night sweats.  Differential diagnosis is between lymphoma versus infection versus hepatic diseases.  Since the patient  does not have any evidence of infection or hepatic disease, most likely etiology is lymphoma. In order to determine treatment plans we will needed tissue diagnosis.  Probable sources of tissue could be from the mediastinal/hilar lymph nodes as well as the spleen. We will discuss the best way to obtain tissue diagnosis.  Thank you very much for the consultation.

## 2018-07-24 ENCOUNTER — Other Ambulatory Visit: Payer: Self-pay

## 2018-07-24 DIAGNOSIS — N179 Acute kidney failure, unspecified: Principal | ICD-10-CM

## 2018-07-24 DIAGNOSIS — D696 Thrombocytopenia, unspecified: Secondary | ICD-10-CM

## 2018-07-24 DIAGNOSIS — D61818 Other pancytopenia: Secondary | ICD-10-CM

## 2018-07-24 LAB — COMPREHENSIVE METABOLIC PANEL
ALT: 34 U/L (ref 0–44)
AST: 71 U/L — ABNORMAL HIGH (ref 15–41)
Albumin: 2.6 g/dL — ABNORMAL LOW (ref 3.5–5.0)
Alkaline Phosphatase: 236 U/L — ABNORMAL HIGH (ref 38–126)
Anion gap: 11 (ref 5–15)
BUN: 17 mg/dL (ref 8–23)
CO2: 21 mmol/L — ABNORMAL LOW (ref 22–32)
Calcium: 8.3 mg/dL — ABNORMAL LOW (ref 8.9–10.3)
Chloride: 105 mmol/L (ref 98–111)
Creatinine, Ser: 1.21 mg/dL (ref 0.61–1.24)
GFR calc Af Amer: >60 mL/min (ref >60)
GFR calc non Af Amer: 56 mL/min — ABNORMAL LOW (ref >60)
Glucose, Bld: 94 mg/dL (ref 70–99)
Potassium: 4 mmol/L (ref 3.5–5.1)
Sodium: 137 mmol/L (ref 135–145)
Total Bilirubin: 2.4 mg/dL — ABNORMAL HIGH (ref 0.3–1.2)
Total Protein: 4.6 g/dL — ABNORMAL LOW (ref 6.5–8.1)

## 2018-07-24 LAB — CBC WITH DIFFERENTIAL/PLATELET
Abs Immature Granulocytes: 0.01 10*3/uL (ref 0.00–0.07)
Basophils Absolute: 0 10*3/uL (ref 0.0–0.1)
Basophils Relative: 1 %
Eosinophils Absolute: 0 10*3/uL (ref 0.0–0.5)
Eosinophils Relative: 2 %
HCT: 32.3 % — ABNORMAL LOW (ref 39.0–52.0)
Hemoglobin: 10.1 g/dL — ABNORMAL LOW (ref 13.0–17.0)
Immature Granulocytes: 1 %
Lymphocytes Relative: 38 %
Lymphs Abs: 0.7 10*3/uL (ref 0.7–4.0)
MCH: 25.9 pg — ABNORMAL LOW (ref 26.0–34.0)
MCHC: 31.3 g/dL (ref 30.0–36.0)
MCV: 82.8 fL (ref 80.0–100.0)
Monocytes Absolute: 0.2 10*3/uL (ref 0.1–1.0)
Monocytes Relative: 10 %
Neutro Abs: 0.9 10*3/uL — ABNORMAL LOW (ref 1.7–7.7)
Neutrophils Relative %: 48 %
Platelets: 48 10*3/uL — ABNORMAL LOW (ref 150–400)
RBC: 3.9 MIL/uL — ABNORMAL LOW (ref 4.22–5.81)
RDW: 15.3 % (ref 11.5–15.5)
WBC: 1.9 10*3/uL — ABNORMAL LOW (ref 4.0–10.5)
nRBC: 0 % (ref 0.0–0.2)

## 2018-07-24 NOTE — Consult Note (Addendum)
Name: Zachary Bennett MRN: [id number] DOB: 1934/11/29    ADMISSION DATE:  07/22/2018 CONSULTATION DATE: 07/24/2018  REFERRING MD : [facility]  CHIEF COMPLAINT: Abdominal discomfort  BRIEF PATIENT DESCRIPTION: 84 year old male who is in no acute distress.  SIGNIFICANT EVENTS    STUDIES:  07/23/2018 CT of the chest.  14 mm pretracheal lymph node.  Subcarinal image with a 3 cm diameter lymph node.  Small right dependent atelectasis and effusion.   HISTORY OF PRESENT ILLNESS:   Zachary Bennett is an 84 year old male who is retired from [employer], [employer].  He is a 1 pack/day smoker since age of 43 when you are entered the [employer].  He was in his usual state of good health the only medication he takes is a statin for high cholesterol.  He does have bilateral inguinal hernias and he was having abdominal pain for approximately [redacted] weeks along with increased pain when overeating.  He went to see Dr. Greer Pickerel a local surgeon who stated that this the hernias were not the cause of his abdominal discomfort.  He was then sent to urgent care evaluated and then sent to the emergency room at [facility]. He had multiple scans.  It revealed 14 mm pretracheal node, splenomegaly, a 3 cm subcarinal lymph node.  He is noted to be pancytopenia with a white count of 1.9.  He was evaluated by medical oncology for concern of lymphoma versus other cancerous process.  Interventional radiology was contacted for possible biopsy of the pretracheal node but did not feel this was appropriate. 07/24/2018 pulmonary critical care was contacted for questionable biopsy of the 3 cm subcarinal lymph node.  Note he reports bowel movements are is normal.  Abdominal pain is improved since he is been in the hospital.  He denies chest pain or any other discomfort.  PAST MEDICAL HISTORY :   has a past medical history of Abdominal hernia, High cholesterol, and Thrombocytopenia ([place]).  has no past  surgical history on file. Prior to Admission medications   Medication Sig Start Date End Date Taking? Authorizing Provider  pravastatin (PRAVACHOL) 40 MG tablet Take 40 mg by mouth daily.  04/06/18  Yes [provider]   No Known Allergies  FAMILY HISTORY:  family history is not on file.  Parents divorced early had very little contact with him does not know their history SOCIAL HISTORY:  reports that he has been smoking. He has never used smokeless tobacco.  1 pack a day smoker since age 40  REVIEW OF SYSTEMS:   10 point review of system taken, please see HPI for positives and negatives.  SUBJECTIVE:   VITAL SIGNS: Temp:  [97.3 F (36.3 C)-98.5 F (36.9 C)] 98.3 F (36.8 C) (05/01 0800) Pulse Rate:  [66-96] 89 (05/01 0800) Resp:  [18] 18 (05/01 0800) BP: (86-114)/(56-71) 105/57 (05/01 0800) SpO2:  [94 %-98 %] 98 % (05/01 0800)  PHYSICAL EXAMINATION: General: Well-nourished well-developed male no acute distress Neuro: Grossly intact, moves all extremities speech is clear well orientated HEENT: Multiple missing teeth dental caries are noted Cardiovascular: Heart sounds are regular regular rate and rhythm Lungs: Clear to auscultation Abdomen: Mildly tender positive bowel sounds bilateral hernias are noted Musculoskeletal: Intact Skin: Warm and dry  Recent Labs  Lab 07/22/18 1302 07/23/18 0303 07/24/18 0158  NA 134* 134* 137  K 4.4 4.0 4.0  CL 97* 103 105  CO2 21* 24 21*  BUN 23 20 17   CREATININE 1.65*  1.25* 1.21  GLUCOSE 102* 86 94   Recent Labs  Lab 07/22/18 1302 07/23/18 0303 07/24/18 0158  HGB 12.4* 9.8* 10.1*  HCT 38.8* 30.2* 32.3*  WBC 2.9* 2.1* 1.9*  PLT 49* 47* 48*   Ct Chest W Contrast  Addendum Date: 07/24/2018   ADDENDUM REPORT: 07/24/2018 09:27 ADDENDUM: 13 mm right pericardial node also present. Electronically Signed   By: Nelson Chimes M.D.   On: 07/24/2018 09:27   Result Date: 07/24/2018 CLINICAL DATA:  Assess for thoracic malignancy.  Abdominal pain. Splenomegaly and abdominal adenopathy. EXAM: CT CHEST WITH CONTRAST TECHNIQUE: Multidetector CT imaging of the chest was performed during intravenous contrast administration. CONTRAST:  3mL OMNIPAQUE IOHEXOL 300 MG/ML  SOLN COMPARISON:  CT abdomen done yesterday. FINDINGS: Cardiovascular: Heart size is normal. The patient has coronary artery calcification. No pericardial effusion. Pulmonary arteries appear normal size. There is atherosclerosis of the aorta but no aneurysm or dissection. Mediastinum/Nodes: Pretracheal lymph node axial image 51 measuring 14 mm in diameter. Subcarinal adenopathy axial image 79 measuring 3 cm in diameter. No sign of hilar nodes. No axillary adenopathy. Lungs/Pleura: Background centrilobular emphysema, upper lobe predominant. No pulmonary mass. Small pleural effusion on the right layering dependently with dependent atelectasis. Upper Abdomen: See results of abdominal scan yesterday. No interval change. Musculoskeletal: Negative IMPRESSION: Aortic atherosclerosis and coronary artery calcification. 14 mm pretracheal lymph node. 3 cm subcarinal lymph node. These could relate to the same pathologic process resulting in the abdominal adenopathy. There is no evidence of a primary neoplasm in the chest. Small right effusion layering dependently with mild right dependent atelectasis. Electronically Signed: By: Nelson Chimes M.D. On: 07/23/2018 21:40   Ct Abdomen Pelvis W Contrast  Addendum Date: 07/22/2018   ADDENDUM REPORT: 07/22/2018 16:10 ADDENDUM: The liver does not appear to be significantly abnormal, but given the presence of severe splenomegaly and porta hepatis adenopathy, severe hepatocellular disease or early hepatic cirrhosis cannot be excluded. Correlation with liver function tests is recommended. Electronically Signed   By: Marijo Conception M.D.   On: 07/22/2018 16:10   Result Date: 07/22/2018 CLINICAL DATA:  Generalized abdominal pain. EXAM: CT ABDOMEN AND  PELVIS WITH CONTRAST TECHNIQUE: Multidetector CT imaging of the abdomen and pelvis was performed using the standard protocol following bolus administration of intravenous contrast. CONTRAST:  156mL OMNIPAQUE IOHEXOL 300 MG/ML  SOLN COMPARISON:  CT scan of December 30, 2008. FINDINGS: Lower chest: Minimal right pleural effusion is noted with adjacent subsegmental atelectasis. Hepatobiliary: Minimal cholelithiasis is noted with gallbladder wall thickening. No biliary dilatation is noted. The liver is unremarkable. Pancreas: Unremarkable. No pancreatic ductal dilatation or surrounding inflammatory changes. Spleen: There is interval development of severe splenomegaly without focal abnormality. Adrenals/Urinary Tract: Adrenal glands are unremarkable. Kidneys are normal, without renal calculi, focal lesion, or hydronephrosis. Bladder is unremarkable. Stomach/Bowel: The stomach appears normal. There is no evidence of bowel obstruction or inflammation. The appendix is not clearly visualized. Vascular/Lymphatic: Atherosclerosis of abdominal aorta is noted. 2.4 cm fusiform right common iliac artery aneurysm is noted. Adenopathy is noted in the porta hepatis region as well as in the periaortic region. Porta hepatis adenopathy measures 4.7 x 2.6 cm. 12 mm right periaortic lymph node is noted. Reproductive: Stable severe enlargement of prostate gland is noted. Other: Bilateral inguinal hernias are noted which contain loops of small bowel, but does not result in obstruction or incarceration. Musculoskeletal: No acute or significant osseous findings. IMPRESSION: Minimal cholelithiasis is noted with gallbladder wall thickening. Possible cholecystitis cannot be  excluded. HIDA scan may be performed for further evaluation. Severe splenomegaly is noted with significantly enlarged adenopathy in the porta hepatis and periaortic regions. This is concerning for lymphoma or other malignancy and clinical correlation is recommended. Stable  severe enlargement of prostate gland is noted. Mild bilateral inguinal hernias are noted which contain loops of small bowel, but does not result in incarceration or obstruction. 2.4 cm fusiform right common iliac artery aneurysm. Aortic Atherosclerosis (ICD10-I70.0). Electronically Signed: By: Marijo Conception M.D. On: 07/22/2018 15:51   US Abdomen Limited  Result Date: 07/22/2018 CLINICAL DATA:  Elevated liver function tests. EXAM: ULTRASOUND ABDOMEN LIMITED RIGHT UPPER QUADRANT COMPARISON:  None. FINDINGS: Gallbladder: Multiple small less than 1 cm gallstones are seen. The gallbladder is nondilated. Diffuse gallbladder wall thickening is seen measuring up to 6 mm. No evidence of pericholecystic fluid. No sonographic Murphy sign noted by sonographer. Common bile duct: Diameter: 4 mm, within normal limits. Liver: No focal lesion identified. Within normal limits in parenchymal echogenicity. Portal vein is patent on color Doppler imaging with normal direction of blood flow towards the liver. IMPRESSION: Cholelithiasis. Nondilated gallbladder with diffuse wall thickening, 5 without pericholecystic fluid or sonographic Murphy's sign. These findings are nonspecific. Chronic cholecystitis cannot be excluded. No evidence of biliary ductal dilatation. Unremarkable sonographic appearance of liver. Electronically Signed   By: Earle Gell M.D.   On: 07/22/2018 20:29    ASSESSMENT / PLAN:  84 year old, left lung smoker 1 pack/day since age 23, with bilateral hernias was sent from surgeon's office to urgent care then to the emergency room.  Found to have a 14 mm pretracheal node and a 3 cm subcarinal lymph node.  Interventional radiology has declined to do pretracheal node pulmonary critical care asked to evaluate for possible biopsy of the subcarinal node.  The concern is for questionable lymphoma versus some other type of cancerous process.  Plan: We will have pulmonary critical care specialist evaluate scans and  determine if biopsy of subcarinal node is a feasibility at this time.  Considered stop smoking.   Richardson Landry Minor ACNP [facility] Shape PCCM Pager [phone] till 1 pm If no answer page [phone] 07/24/2018, 11:07 AM    PCCM Attending:  84 yo male, being evaluated for abdominal discomfort, h/o ITP, found to have splenomegaly, seen by oncology concerned for possible underlying lymphoma diagnosis. Pulmonary consulted for possible EBUS of subcarinal adenopathy.   BP (!) 110/58 (BP Location: Left Arm)   Pulse (!) 101   Temp 98.4 F (36.9 C) (Oral)   Resp 18   Wt 71.2 kg   SpO2 97%   BMI 27.81 kg/m   Gen: elderly male, resting in bed, no distress Neck: no palpable adenopathy  Heart: RRR, S1 s2  Lungs: No crackles, no wheeze, normal breaths  Ext: no edema   A: Mediastinal Adenopathy  Splenomegaly   Pancytopenia   P: Case discussed with Dr. Lindi Adie.  EBUS can be useful in diagnosis of lymphoma from the mediastinum but the yield is less than a nodal resection.  Would prefer to have PET imaging prior to EBUS as to help guide location of most PET avid location to aid in yield from EBUS TBNA  We would not want to put the patient under anesthesia for EBUS to PET neg adenopathy with a low yield potentially 30% chance at establishing diagnosis.  Also, if PET reveals a more superficial location neck or axilla would prefer a nodal resection that would give a higher yield at [place]  diagnosis.  Overall, once PET is back we can plan for EBUS guided TBNA if needed.Last resort would be splenectomy.   Pulmonary appreciates the consultation. We will arrange outpatient follow up regarding PET scan and potential need for bronchoscopy.   I discussed all of these pros and cons with the patient and he has a great understanding of the approach.   Garner Nash, DO [facility] 07/24/2018 4:01 PM

## 2018-07-24 NOTE — Progress Notes (Signed)
PROGRESS NOTE  Zachary Bennett ZOX:[id number] DOB: 03-Oct-1934 DOA: 07/22/2018 PCP: Nicholas Lose, MD   LOS: 2 days   Brief Narrative / Interim history: H/o HLD, thrombocytopenia sent from general surgery Dr Dois Davenport office to ER for evaluation of abdominal pain. Patient reports has been having ongoing ab pain, epigastric and left side poor appetite, has not been eating much for a week, night sweats, no fever, no n/v, no weight loss. He is generally health until 2-3 weeks ago, he started to feel unwell and progressive left sided ab pain, not eating much. He denies cough, sob. Denies signs of bleeding, denies enlarged glands.  Subjective: -No complaints this morning, feels abdominal pain is improved some but still has soreness when he moves around.  No nausea or vomiting.  Assessment & Plan: Active Problems:   AKI (acute kidney injury) ([place])   Elevated LFTs   Epigastric pain   Hyperbilirubinemia   Splenomegaly   Pancytopenia (HCC)   Principal Problem Splenomegaly with pancytopenia -Imaging and symptomatology with night sweats, early satiety, concerning for lymphoma -Oncology, Dr. Lindi Adie consulted and appreciate input, discussed with him over the phone this morning, underwent CT scan of the chest along with the abdomen and pelvis which showed a pretracheal lymph node as well as a subcarinal lymph node.  Discussed with IR, not amenable to biopsy the pretracheal node.  Discussed with pulmonology regarding the subcarinal node biopsy, appreciate input, awaiting evaluation.  Active Problems Elevated bilirubin/mild elevation of LFTs -CT abdomen and pelvis showed minimal cholelithiasis with gallbladder wall thickening.  Right upper quadrant ultrasound without significant abnormalities, liver parenchymal echogenicity is normal, gallbladder is nondilated without pericholecystic fluid or sonographic Murphy sign, no evidence of biliary ductal dilatation.  He may have a degree of chronic cholecystitis  but his symptoms do not appear acute.  LFTs are improving and symptoms not consistent with cholecystitis  Acute kidney injury -Likely prerenal due to dehydration in the setting of poor p.o. intake, he was placed on IV fluids and creatinine is improved this morning.  -Continue to monitor  Hyperlipidemia -Hold statin now due to elevated LFTs  Stable severe enlargement of prostate gland is noted on CT ab, denies urinary symptom  Mild bilateral inguinal hernias are noted which contain loops of small bowel, but does not result in incarceration or obstruction.  2.4 cm fusiform right common iliac artery aneurysm.  Scheduled Meds: Continuous Infusions:  PRN Meds:.zolpidem  DVT prophylaxis: SCDs Code Status: Full code Family Communication: no family at bedside  Disposition Plan: home when ready   Consultants:   Oncology   Pulmonary  Procedures:   None   Antimicrobials:  None    Objective: Vitals:   07/23/18 2125 07/24/18 0530 07/24/18 0534 07/24/18 0800  BP: 113/71 (!) 86/56 110/70 (!) 105/57  Pulse: 67 66 96 89  Resp: 18 18  18   Temp: 98 F (36.7 C) (!) 97.3 F (36.3 C)  98.3 F (36.8 C)  TempSrc: Oral Oral  Oral  SpO2: 97% 94%  98%  Weight:        Intake/Output Summary (Last 24 hours) at 07/24/2018 1354 Last data filed at 07/23/2018 2000 Gross per 24 hour  Intake 733.75 ml  Output -  Net 733.75 ml   Filed Weights   07/22/18 1238  Weight: 71.2 kg    Examination:  Constitutional: NAD Eyes: No scleral icterus ENMT: Moist mucous membranes Respiratory: Clear to auscultation bilaterally without wheezing or crackles.  Normal respiratory effort Cardiovascular: Regular rate and rhythm, no  murmurs appreciated Abdomen: Soft, some voluntary guarding but no tenderness.  Bowel sounds positive Musculoskeletal: no clubbing / cyanosis.  Skin: No rashes seen Neurologic: No focal deficits, equal strength  Data Reviewed: I have independently reviewed following labs  and imaging studies   CBC: Recent Labs  Lab 07/22/18 1302 07/23/18 0303 07/24/18 0158  WBC 2.9* 2.1* 1.9*  NEUTROABS 1.8 0.9* 0.9*  HGB 12.4* 9.8* 10.1*  HCT 38.8* 30.2* 32.3*  MCV 82.9 82.1 82.8  PLT 49* 47* 48*   Basic Metabolic Panel: Recent Labs  Lab 07/22/18 1302 07/23/18 0303 07/24/18 0158  NA 134* 134* 137  K 4.4 4.0 4.0  CL 97* 103 105  CO2 21* 24 21*  GLUCOSE 102* 86 94  BUN 23 20 17   CREATININE 1.65* 1.25* 1.21  CALCIUM 8.8* 8.3* 8.3*   GFR: CrCl cannot be calculated (Unknown ideal weight.). Liver Function Tests: Recent Labs  Lab 07/22/18 1302 07/23/18 0303 07/24/18 0158  AST 93* 69* 71*  ALT 40 33 34  ALKPHOS 301* 225* 236*  BILITOT 4.9* 3.3* 2.4*  PROT 6.8 4.8* 4.6*  ALBUMIN 3.7 2.8* 2.6*   Recent Labs  Lab 07/22/18 1302  LIPASE 44   No results for input(s): AMMONIA in the last 168 hours. Coagulation Profile: Recent Labs  Lab 07/23/18 0730  INR 1.1   Cardiac Enzymes: No results for input(s): CKTOTAL, CKMB, CKMBINDEX, TROPONINI in the last 168 hours. BNP (last 3 results) No results for input(s): PROBNP in the last 8760 hours. HbA1C: No results for input(s): HGBA1C in the last 72 hours. CBG: No results for input(s): GLUCAP in the last 168 hours. Lipid Profile: Recent Labs    07/23/18 0303  CHOL 124  HDL <10*  LDLCALC NOT CALCULATED  TRIG 202*  CHOLHDL NOT CALCULATED   Thyroid Function Tests: No results for input(s): TSH, T4TOTAL, FREET4, T3FREE, THYROIDAB in the last 72 hours. Anemia Panel: No results for input(s): VITAMINB12, FOLATE, FERRITIN, TIBC, IRON, RETICCTPCT in the last 72 hours. Urine analysis:    Component Value Date/Time   COLORURINE AMBER (A) 07/22/2018 1308   APPEARANCEUR HAZY (A) 07/22/2018 1308   LABSPEC 1.027 07/22/2018 1308   PHURINE 5.0 07/22/2018 1308   GLUCOSEU NEGATIVE 07/22/2018 1308   HGBUR SMALL (A) 07/22/2018 1308   BILIRUBINUR SMALL (A) 07/22/2018 1308   KETONESUR 5 (A) 07/22/2018 1308    PROTEINUR 100 (A) 07/22/2018 1308   UROBILINOGEN 0.2 07/10/2007 1617   NITRITE NEGATIVE 07/22/2018 1308   LEUKOCYTESUR NEGATIVE 07/22/2018 1308   Sepsis Labs: Invalid input(s): PROCALCITONIN, LACTICIDVEN  No results found for this or any previous visit (from the past 240 hour(s)).    Radiology Studies: Ct Chest W Contrast  Addendum Date: 07/24/2018   ADDENDUM REPORT: 07/24/2018 09:27 ADDENDUM: 13 mm right pericardial node also present. Electronically Signed   By: Nelson Chimes M.D.   On: 07/24/2018 09:27   Result Date: 07/24/2018 CLINICAL DATA:  Assess for thoracic malignancy. Abdominal pain. Splenomegaly and abdominal adenopathy. EXAM: CT CHEST WITH CONTRAST TECHNIQUE: Multidetector CT imaging of the chest was performed during intravenous contrast administration. CONTRAST:  79mL OMNIPAQUE IOHEXOL 300 MG/ML  SOLN COMPARISON:  CT abdomen done yesterday. FINDINGS: Cardiovascular: Heart size is normal. The patient has coronary artery calcification. No pericardial effusion. Pulmonary arteries appear normal size. There is atherosclerosis of the aorta but no aneurysm or dissection. Mediastinum/Nodes: Pretracheal lymph node axial image 51 measuring 14 mm in diameter. Subcarinal adenopathy axial image 79 measuring 3 cm in diameter. No sign  of hilar nodes. No axillary adenopathy. Lungs/Pleura: Background centrilobular emphysema, upper lobe predominant. No pulmonary mass. Small pleural effusion on the right layering dependently with dependent atelectasis. Upper Abdomen: See results of abdominal scan yesterday. No interval change. Musculoskeletal: Negative IMPRESSION: Aortic atherosclerosis and coronary artery calcification. 14 mm pretracheal lymph node. 3 cm subcarinal lymph node. These could relate to the same pathologic process resulting in the abdominal adenopathy. There is no evidence of a primary neoplasm in the chest. Small right effusion layering dependently with mild right dependent atelectasis.  Electronically Signed: By: Nelson Chimes M.D. On: 07/23/2018 21:40   Ct Abdomen Pelvis W Contrast  Addendum Date: 07/22/2018   ADDENDUM REPORT: 07/22/2018 16:10 ADDENDUM: The liver does not appear to be significantly abnormal, but given the presence of severe splenomegaly and porta hepatis adenopathy, severe hepatocellular disease or early hepatic cirrhosis cannot be excluded. Correlation with liver function tests is recommended. Electronically Signed   By: Marijo Conception M.D.   On: 07/22/2018 16:10   Result Date: 07/22/2018 CLINICAL DATA:  Generalized abdominal pain. EXAM: CT ABDOMEN AND PELVIS WITH CONTRAST TECHNIQUE: Multidetector CT imaging of the abdomen and pelvis was performed using the standard protocol following bolus administration of intravenous contrast. CONTRAST:  121mL OMNIPAQUE IOHEXOL 300 MG/ML  SOLN COMPARISON:  CT scan of December 30, 2008. FINDINGS: Lower chest: Minimal right pleural effusion is noted with adjacent subsegmental atelectasis. Hepatobiliary: Minimal cholelithiasis is noted with gallbladder wall thickening. No biliary dilatation is noted. The liver is unremarkable. Pancreas: Unremarkable. No pancreatic ductal dilatation or surrounding inflammatory changes. Spleen: There is interval development of severe splenomegaly without focal abnormality. Adrenals/Urinary Tract: Adrenal glands are unremarkable. Kidneys are normal, without renal calculi, focal lesion, or hydronephrosis. Bladder is unremarkable. Stomach/Bowel: The stomach appears normal. There is no evidence of bowel obstruction or inflammation. The appendix is not clearly visualized. Vascular/Lymphatic: Atherosclerosis of abdominal aorta is noted. 2.4 cm fusiform right common iliac artery aneurysm is noted. Adenopathy is noted in the porta hepatis region as well as in the periaortic region. Porta hepatis adenopathy measures 4.7 x 2.6 cm. 12 mm right periaortic lymph node is noted. Reproductive: Stable severe enlargement of  prostate gland is noted. Other: Bilateral inguinal hernias are noted which contain loops of small bowel, but does not result in obstruction or incarceration. Musculoskeletal: No acute or significant osseous findings. IMPRESSION: Minimal cholelithiasis is noted with gallbladder wall thickening. Possible cholecystitis cannot be excluded. HIDA scan may be performed for further evaluation. Severe splenomegaly is noted with significantly enlarged adenopathy in the porta hepatis and periaortic regions. This is concerning for lymphoma or other malignancy and clinical correlation is recommended. Stable severe enlargement of prostate gland is noted. Mild bilateral inguinal hernias are noted which contain loops of small bowel, but does not result in incarceration or obstruction. 2.4 cm fusiform right common iliac artery aneurysm. Aortic Atherosclerosis (ICD10-I70.0). Electronically Signed: By: Marijo Conception M.D. On: 07/22/2018 15:51   US Abdomen Limited  Result Date: 07/22/2018 CLINICAL DATA:  Elevated liver function tests. EXAM: ULTRASOUND ABDOMEN LIMITED RIGHT UPPER QUADRANT COMPARISON:  None. FINDINGS: Gallbladder: Multiple small less than 1 cm gallstones are seen. The gallbladder is nondilated. Diffuse gallbladder wall thickening is seen measuring up to 6 mm. No evidence of pericholecystic fluid. No sonographic Murphy sign noted by sonographer. Common bile duct: Diameter: 4 mm, within normal limits. Liver: No focal lesion identified. Within normal limits in parenchymal echogenicity. Portal vein is patent on color Doppler imaging with normal direction of  blood flow towards the liver. IMPRESSION: Cholelithiasis. Nondilated gallbladder with diffuse wall thickening, 5 without pericholecystic fluid or sonographic Murphy's sign. These findings are nonspecific. Chronic cholecystitis cannot be excluded. No evidence of biliary ductal dilatation. Unremarkable sonographic appearance of liver. Electronically Signed   By: Earle Gell M.D.   On: 07/22/2018 20:29    Marzetta Board, MD, PhD [facility]  Contact via  [email or web address]  [place] P: [phone]  F: [phone]

## 2018-07-24 NOTE — Care Management Important Message (Signed)
Important Message  Patient Details  Name: Zachary Bennett MRN: [id number] Date of Birth: 04-22-1934   Medicare Important Message Given:  Yes    Faye Sanfilippo [address] 07/24/2018, 3:39 PM

## 2018-07-24 NOTE — Progress Notes (Addendum)
HEMATOLOGY-ONCOLOGY PROGRESS NOTE  SUBJECTIVE: Patient reports mild abdominal discomfort this morning.  Denies nausea and vomiting.  Bowels are moving.  Denies chest discomfort and shortness of breath.  Denies epistaxis, hemoptysis, hematuria, melena, hematochezia.  Easily.   REVIEW OF SYSTEMS:   Constitutional: Denies fevers, chills.  Reports mild fatigue.   Eyes: Denies blurriness of vision Ears, nose, mouth, throat, and face: Denies mucositis or sore throat Respiratory: Denies cough, dyspnea or wheezes Cardiovascular: Denies palpitation, chest discomfort Gastrointestinal: Reports abdominal pain in the center of his abdomen and in the left upper quadrant.  Denies nausea, heartburn or change in bowel habits Skin: Denies abnormal skin rashes Lymphatics: Denies new lymphadenopathy.  Bruises easily. Neurological:Denies numbness, tingling or new weaknesses Behavioral/Psych: Mood is stable, no new changes  Extremities: No lower extremity edema All other systems were reviewed with the patient and are negative.  I have reviewed the past medical history, past surgical history, social history and family history with the patient and they are unchanged from previous note.   PHYSICAL EXAMINATION: ECOG PERFORMANCE STATUS: 1 - Symptomatic but completely ambulatory  Vitals:   07/24/18 0534 07/24/18 0800  BP: 110/70 (!) 105/57  Pulse: 96 89  Resp:  18  Temp:  98.3 F (36.8 C)  SpO2:  98%   Filed Weights   07/22/18 1238  Weight: 156 lb 15.5 oz (71.2 kg)    GENERAL:alert, no distress and comfortable SKIN: skin color, texture, turgor are normal, no rashes or significant lesions EYES: normal, Conjunctiva are pink and non-injected, sclera clear OROPHARYNX:no exudate, no erythema and lips, buccal mucosa, and tongue normal  NECK: supple, thyroid normal size, non-tender, without nodularity LYMPH:  no palpable lymphadenopathy in the cervical, axillary or inguinal LUNGS: clear to auscultation and  percussion with normal breathing effort HEART: regular rate & rhythm and no murmurs and no lower extremity edema ABDOMEN: Normal bowel sounds, abdomen soft, diffuse tenderness with light palpation.  Splenomegaly noted. Musculoskeletal:no cyanosis of digits and no clubbing  NEURO: alert & oriented x 3 with fluent speech, no focal motor/sensory deficits  LABORATORY DATA:  I have reviewed the data as listed CMP Latest Ref Rng & Units 07/24/2018 07/23/2018 07/22/2018  Glucose 70 - 99 mg/dL 94 86 102(H)  BUN 8 - 23 mg/dL 17 20 23   Creatinine 0.61 - 1.24 mg/dL 1.21 1.25(H) 1.65(H)  Sodium 135 - 145 mmol/L 137 134(L) 134(L)  Potassium 3.5 - 5.1 mmol/L 4.0 4.0 4.4  Chloride 98 - 111 mmol/L 105 103 97(L)  CO2 22 - 32 mmol/L 21(L) 24 21(L)  Calcium 8.9 - 10.3 mg/dL 8.3(L) 8.3(L) 8.8(L)  Total Protein 6.5 - 8.1 g/dL 4.6(L) 4.8(L) 6.8  Total Bilirubin 0.3 - 1.2 mg/dL 2.4(H) 3.3(H) 4.9(H)  Alkaline Phos 38 - 126 U/L 236(H) 225(H) 301(H)  AST 15 - 41 U/L 71(H) 69(H) 93(H)  ALT 0 - 44 U/L 34 33 40    Lab Results  Component Value Date   WBC 1.9 (L) 07/24/2018   HGB 10.1 (L) 07/24/2018   HCT 32.3 (L) 07/24/2018   MCV 82.8 07/24/2018   PLT 48 (L) 07/24/2018   NEUTROABS 0.9 (L) 07/24/2018    ASSESSMENT AND PLAN: This is an 84 year old male with a 2-week history of abdominal pain who has pancytopenia and an enlarged spleen.  Imaging is concerning for lymphoma versus another malignancy.  Recommendation has been made to perform a biopsy of the spleen, but this cannot be done due to the risk of bleeding and because there are  no focal splenic lesions.  CT scan of the chest was performed with contrast which shows a 14 mm pretracheal lymph node, 3 cm subcarinal lymph node, and 13 mm right pericardial node.  -Recommend biopsy of 1 of the lymph nodes in the chest. -Hospitalist has already ordered a CT-guided biopsy by IR.  If interventional radiology cannot perform this, recommend consult from pulmonary for  consideration of EBUS. -CBC reviewed and counts remain stable. Continue to monitor CBC daily.    Mikey Bussing, DNP, AGPCNP-BC, AOCNP  Attending Note  I personally saw the patient, reviewed the chart and examined the patient. The plan of care was discussed with the patient and the admitting team. I agree with the assessment and plan as documented above.   1. CT chest showing mediastinal lymph nodes.  I discussed with Dr.Icard about E bus.  After much discussion we decided that the best possible approach would be to perform a PET CT scan to determine if there are hypermetabolic lymph nodes elsewhere in the body.  If there are no other areas of lymphadenopathy, we can pursue EBUS biopsy. The last alternate would be to perform a splenectomy. After the patient is discharged we will set up for a PET CT scan and follow-up were made to discuss the results and send laboratory diagnostic studies.  2. Pancytopenia: Could be related to BM involvement from lymphoma vs Extraneous factors like medications.  We may perform a BM Bx as an outpatient.

## 2018-07-24 NOTE — Progress Notes (Signed)
Patient ID: Zachary Bennett, male   DOB: 08-07-1934, 84 y.o.   MRN: [id number]   Request for biopsy of Pretracheal LN per MD  Dr Anselm Pancoast has reviewed imaging:  He feels there is not anything for IR to biopsy at this time Could consider GI EUS for upper abdominal LNs  Or Pulmonary for subcarinal tissue. May need PET to determine of LAN is positive  Will inform Dr Renne Crigler

## 2018-07-25 LAB — CBC WITH DIFFERENTIAL/PLATELET
Abs Immature Granulocytes: 0.02 10*3/uL (ref 0.00–0.07)
Basophils Absolute: 0 10*3/uL (ref 0.0–0.1)
Basophils Relative: 1 %
Eosinophils Absolute: 0 10*3/uL (ref 0.0–0.5)
Eosinophils Relative: 1 %
HCT: 31.8 % — ABNORMAL LOW (ref 39.0–52.0)
Hemoglobin: 10.1 g/dL — ABNORMAL LOW (ref 13.0–17.0)
Immature Granulocytes: 1 %
Lymphocytes Relative: 46 %
Lymphs Abs: 1 10*3/uL (ref 0.7–4.0)
MCH: 26.3 pg (ref 26.0–34.0)
MCHC: 31.8 g/dL (ref 30.0–36.0)
MCV: 82.8 fL (ref 80.0–100.0)
Monocytes Absolute: 0.2 10*3/uL (ref 0.1–1.0)
Monocytes Relative: 10 %
Neutro Abs: 0.9 10*3/uL — ABNORMAL LOW (ref 1.7–7.7)
Neutrophils Relative %: 41 %
Platelets: 47 10*3/uL — ABNORMAL LOW (ref 150–400)
RBC: 3.84 MIL/uL — ABNORMAL LOW (ref 4.22–5.81)
RDW: 15.7 % — ABNORMAL HIGH (ref 11.5–15.5)
WBC: 2.2 10*3/uL — ABNORMAL LOW (ref 4.0–10.5)
nRBC: 0 % (ref 0.0–0.2)

## 2018-07-25 LAB — BASIC METABOLIC PANEL
Anion gap: 10 (ref 5–15)
BUN: 17 mg/dL (ref 8–23)
CO2: 23 mmol/L (ref 22–32)
Calcium: 8.3 mg/dL — ABNORMAL LOW (ref 8.9–10.3)
Chloride: 106 mmol/L (ref 98–111)
Creatinine, Ser: 1.13 mg/dL (ref 0.61–1.24)
GFR calc Af Amer: >60 mL/min (ref >60)
GFR calc non Af Amer: >60 mL/min (ref >60)
Glucose, Bld: 93 mg/dL (ref 70–99)
Potassium: 4 mmol/L (ref 3.5–5.1)
Sodium: 139 mmol/L (ref 135–145)

## 2018-07-25 MED ORDER — GLYCERIN (LAXATIVE) 2.1 G RE SUPP
1.0000 | Freq: Every day | RECTAL | Status: DC | PRN
  Filled 2018-07-25: qty 1

## 2018-07-25 MED ORDER — SORBITOL 70 % SOLN
960.0000 mL | TOPICAL_OIL | Freq: Once | ORAL | Status: DC | PRN
  Filled 2018-07-25: qty 473

## 2018-07-25 MED ORDER — SENNOSIDES-DOCUSATE SODIUM 8.6-50 MG PO TABS: 2.0000 | ORAL_TABLET | Freq: Once | ORAL | Status: DC | PRN

## 2018-07-25 NOTE — Plan of Care (Signed)
  Problem: Education: Goal: Knowledge of General Education information will improve Description Including pain rating scale, medication(s)/side effects and non-pharmacologic comfort measures Outcome: Adequate for Discharge   Problem: Health Behavior/Discharge Planning: Goal: Ability to manage health-related needs will improve Outcome: Adequate for Discharge   Problem: Clinical Measurements: Goal: Ability to maintain clinical measurements within normal limits will improve Outcome: Adequate for Discharge Goal: Will remain free from infection Outcome: Adequate for Discharge Goal: Diagnostic test results will improve Outcome: Adequate for Discharge Goal: Respiratory complications will improve Outcome: Adequate for Discharge Goal: Cardiovascular complication will be avoided Outcome: Adequate for Discharge   Problem: Activity: Goal: Risk for activity intolerance will decrease Outcome: Adequate for Discharge   Problem: Nutrition: Goal: Adequate nutrition will be maintained Outcome: Adequate for Discharge   Problem: Coping: Goal: Level of anxiety will decrease Outcome: Adequate for Discharge   Problem: Pain Managment: Goal: General experience of comfort will improve Outcome: Adequate for Discharge   Problem: Safety: Goal: Ability to remain free from injury will improve Outcome: Adequate for Discharge

## 2018-07-25 NOTE — Discharge Instructions (Signed)
Follow with Zachary Lose, MD in 5-7 days  Please get a complete blood count and chemistry panel checked by your Primary MD at your next visit, and again as instructed by your Primary MD. Please get your medications reviewed and adjusted by your Primary MD.  Please request your Primary MD to go over all Hospital Tests and Procedure/Radiological results at the follow up, please get all Hospital records sent to your Prim MD by signing hospital release before you go home.  In some cases, there will be blood work, cultures and biopsy results pending at the time of your discharge. Please request that your primary care M.D. goes through all the records of your hospital data and follows up on these results.  If you had Pneumonia of Lung problems at the Hospital: Please get a 2 view Chest X ray done in 6-8 weeks after hospital discharge or sooner if instructed by your Primary MD.  If you have Congestive Heart Failure: Please call your Cardiologist or Primary MD anytime you have any of the following symptoms:  1) 3 pound weight gain in 24 hours or 5 pounds in 1 week  2) shortness of breath, with or without a dry hacking cough  3) swelling in the hands, feet or stomach  4) if you have to sleep on extra pillows at night in order to breathe  Follow cardiac low salt diet and 1.5 lit/day fluid restriction.  If you have diabetes Accuchecks 4 times/day, Once in AM empty stomach and then before each meal. Log in all results and show them to your primary doctor at your next visit. If any glucose reading is under 80 or above 300 call your primary MD immediately.  If you have Seizure/Convulsions/Epilepsy: Please do not drive, operate heavy machinery, participate in activities at heights or participate in high speed sports until you have seen by Primary MD or a Neurologist and advised to do so again.  If you had Gastrointestinal Bleeding: Please ask your Primary MD to check a complete blood count within one  week of discharge or at your next visit. Your endoscopic/colonoscopic biopsies that are pending at the time of discharge, will also need to followed by your Primary MD.  Get Medicines reviewed and adjusted. Please take all your medications with you for your next visit with your Primary MD  Please request your Primary MD to go over all hospital tests and procedure/radiological results at the follow up, please ask your Primary MD to get all Hospital records sent to his/her office.  If you experience worsening of your admission symptoms, develop shortness of breath, life threatening emergency, suicidal or homicidal thoughts you must seek medical attention immediately by calling 911 or calling your MD immediately  if symptoms less severe.  You must read complete instructions/literature along with all the possible adverse reactions/side effects for all the Medicines you take and that have been prescribed to you. Take any new Medicines after you have completely understood and accpet all the possible adverse reactions/side effects.   Do not drive or operate heavy machinery when taking Pain medications.   Do not take more than prescribed Pain, Sleep and Anxiety Medications  Special Instructions: If you have smoked or chewed Tobacco  in the last 2 yrs please stop smoking, stop any regular Alcohol  and or any Recreational drug use.  Wear Seat belts while driving.  Please note You were cared for by a hospitalist during your hospital stay. If you have any questions about your discharge  medications or the care you received while you were in the hospital after you are discharged, you can call the unit and asked to speak with the hospitalist on call if the hospitalist that took care of you is not available. Once you are discharged, your primary care physician will handle any further medical issues. Please note that NO REFILLS for any discharge medications will be authorized once you are discharged, as it is  imperative that you return to your primary care physician (or establish a relationship with a primary care physician if you do not have one) for your aftercare needs so that they can reassess your need for medications and monitor your lab values.  You can reach the hospitalist office at phone [phone] or fax [phone]   If you do not have a primary care physician, you can call [phone] for a physician referral.  Activity: As tolerated with Full fall precautions use walker/cane & assistance as needed    Diet: regular  Disposition Home

## 2018-07-25 NOTE — Discharge Summary (Signed)
Physician Discharge Summary  Zachary Bennett GDJ:[id number] DOB: 1934/10/19 DOA: 07/22/2018  PCP: Zachary Lose, MD  Admit date: 07/22/2018 Discharge date: 07/25/2018  Admitted From: home Disposition:  home  Recommendations for Outpatient Follow-up:  1. Follow up with Dr. Lindi Bennett in a week; PET ordered, to be scheduled  Home Health: none Equipment/Devices: none  Discharge Condition: stable CODE STATUS: Full code Diet recommendation: regular  HPI: Per admitting MD, Zachary Bennett is a 84 y.o. male   H/o HLD, thrombocytopenia sent from general surgery Dr Dois Davenport office to ER for evaluation of abdominal pain. Patient reports has been having ongoing ab pain, epigastric and left side poor appetite, has not been eating much for a week, night sweats, no fever, no n/v, no weight loss. He is generally health until 2-3 weeks ago, he started to feel unwell and progressive left sided ab pain, not eating much. He denies cough, sob. Denies signs of bleeding, denies enlarged glands. ED course: vital signs are stable, labs significant for wbc 2.9, plt 49, cr 1.65, tbili 4.9, ast 93, alk phos 301.  EKG: Independently reviewed. Sinus arrhythmia with irregular rate, no acute st/t changes. CT Ab "Severe splenomegaly is noted with significantly enlarged adenopathy in the porta hepatis and periaortic regions. This is concerning for lymphoma or other malignancy and clinical correlation is recommended." Minimal cholelithiasis is noted with gallbladder wall thickening. Possible cholecystitis cannot be excluded. HIDA scan may be performed for further evaluation." he is given hydration , hospitalist called to admit the patient.  Hospital Course: Principal Problem Splenomegaly with pancytopenia -Imaging and symptomatology with night sweats, early satiety, concerning for lymphoma. Oncology, Dr. Lindi Bennett consulted and followed patient while hospitalized.  Underwent a CT scan of the chest abdomen and pelvis which showed  splenomegaly as well as pretracheal lymph node and subcarinal lymph nodes which were enlarged.  Case was discussed with interventional radiology as well as pulmonology, not clear if these nodules are metabolically active.  Plan for now is for outpatient PET scan followed by targeted biopsy of a hypermetabolic site.   Active Problems Acute kidney injury -this is likely in the setting of poor p.o. intake over the last several days due to abdominal discomfort.  Patient was hydrated with IV fluids, his creatinine gradually improved and normalized to 1.1 on the day of discharge.  He is able to have good p.o. intake, able to tolerate a regular diet and will be discharged home in stable condition  Thrombocytopenia -chronic, followed by Dr. Ladona Mow as an outpatient.  Worsening now, possibly related to #1.  Elevated bilirubin/mild elevation of LFTs -CT abdomen and pelvis showed minimal cholelithiasis with gallbladder wall thickening.  Right upper quadrant ultrasound without significant abnormalities, liver parenchymal echogenicity is normal, gallbladder is nondilated without pericholecystic fluid or sonographic Murphy sign, no evidence of biliary ductal dilatation.  He may have a degree of chronic cholecystitis but his symptoms do not appear acute.  His LFTs are improving, patient had no right upper quadrant pain, no nausea or vomiting or any other symptoms to suggest gallbladder involvement.  Hyperlipidemia -continue home medications  Stable severe enlargement of prostate glandis noted on CT ab, denies urinary symptom  Mild bilateral inguinal herniasare noted which contain loops of small bowel, but does not result in incarceration or obstruction.  2.4 cm fusiform right common iliac artery aneurysm.  Discharge Diagnoses:  Active Problems:   AKI (acute kidney injury) (Zachary Bennett)   Elevated LFTs   Epigastric pain   Hyperbilirubinemia   Splenomegaly  Pancytopenia [facility])     Discharge  Instructions   Allergies as of 07/25/2018   No Known Allergies     Medication List    TAKE these medications   pravastatin 40 MG tablet Commonly known as:  PRAVACHOL Take 40 mg by mouth daily.      Follow-up Information    Zachary Lose, MD. Schedule an appointment as soon as possible for a visit in 1 week(s).   Specialty:  Hematology and Oncology Contact information: [place] [id number] [phone]        Zachary Bennett L, DO Follow up.   Specialty:  Pulmonary Disease Why:  Follow up with Pulmoanry if needed after outpatient PET scan. Contact information: [address] [place] [address] [phone]           Consultations:  Oncology  IR  Pulmonology   Procedures/Studies:  Ct Chest W Contrast  Addendum Date: 07/24/2018   ADDENDUM REPORT: 07/24/2018 09:27 ADDENDUM: 13 mm right pericardial node also present. Electronically Signed   By: Nelson Chimes M.D.   On: 07/24/2018 09:27   Result Date: 07/24/2018 CLINICAL DATA:  Assess for thoracic malignancy. Abdominal pain. Splenomegaly and abdominal adenopathy. EXAM: CT CHEST WITH CONTRAST TECHNIQUE: Multidetector CT imaging of the chest was performed during intravenous contrast administration. CONTRAST:  42m OMNIPAQUE IOHEXOL 300 MG/ML  SOLN COMPARISON:  CT abdomen done yesterday. FINDINGS: Cardiovascular: Heart size is normal. The patient has coronary artery calcification. No pericardial effusion. Pulmonary arteries appear normal size. There is atherosclerosis of the aorta but no aneurysm or dissection. Mediastinum/Nodes: Pretracheal lymph node axial image 51 measuring 14 mm in diameter. Subcarinal adenopathy axial image 79 measuring 3 cm in diameter. No sign of hilar nodes. No axillary adenopathy. Lungs/Pleura: Background centrilobular emphysema, upper lobe predominant. No pulmonary mass. Small pleural effusion on the right layering dependently with dependent atelectasis.  Upper Abdomen: See results of abdominal scan yesterday. No interval change. Musculoskeletal: Negative IMPRESSION: Aortic atherosclerosis and coronary artery calcification. 14 mm pretracheal lymph node. 3 cm subcarinal lymph node. These could relate to the same pathologic process resulting in the abdominal adenopathy. There is no evidence of a primary neoplasm in the chest. Small right effusion layering dependently with mild right dependent atelectasis. Electronically Signed: By: MNelson ChimesM.D. On: 07/23/2018 21:40   Ct Abdomen Pelvis W Contrast  Addendum Date: 07/22/2018   ADDENDUM REPORT: 07/22/2018 16:10 ADDENDUM: The liver does not appear to be significantly abnormal, but given the presence of severe splenomegaly and porta hepatis adenopathy, severe hepatocellular disease or early hepatic cirrhosis cannot be excluded. Correlation with liver function tests is recommended. Electronically Signed   By: JMarijo ConceptionM.D.   On: 07/22/2018 16:10   Result Date: 07/22/2018 CLINICAL DATA:  Generalized abdominal pain. EXAM: CT ABDOMEN AND PELVIS WITH CONTRAST TECHNIQUE: Multidetector CT imaging of the abdomen and pelvis was performed using the standard protocol following bolus administration of intravenous contrast. CONTRAST:  1057mOMNIPAQUE IOHEXOL 300 MG/ML  SOLN COMPARISON:  CT scan of December 30, 2008. FINDINGS: Lower chest: Minimal right pleural effusion is noted with adjacent subsegmental atelectasis. Hepatobiliary: Minimal cholelithiasis is noted with gallbladder wall thickening. No biliary dilatation is noted. The liver is unremarkable. Pancreas: Unremarkable. No pancreatic ductal dilatation or surrounding inflammatory changes. Spleen: There is interval development of severe splenomegaly without focal abnormality. Adrenals/Urinary Tract: Adrenal glands are unremarkable. Kidneys are normal, without renal calculi, focal lesion, or hydronephrosis. Bladder is unremarkable. Stomach/Bowel: The stomach  appears  normal. There is no evidence of bowel obstruction or inflammation. The appendix is not clearly visualized. Vascular/Lymphatic: Atherosclerosis of abdominal aorta is noted. 2.4 cm fusiform right common iliac artery aneurysm is noted. Adenopathy is noted in the porta hepatis region as well as in the periaortic region. Porta hepatis adenopathy measures 4.7 x 2.6 cm. 12 mm right periaortic lymph node is noted. Reproductive: Stable severe enlargement of prostate gland is noted. Other: Bilateral inguinal hernias are noted which contain loops of small bowel, but does not result in obstruction or incarceration. Musculoskeletal: No acute or significant osseous findings. IMPRESSION: Minimal cholelithiasis is noted with gallbladder wall thickening. Possible cholecystitis cannot be excluded. HIDA scan may be performed for further evaluation. Severe splenomegaly is noted with significantly enlarged adenopathy in the porta hepatis and periaortic regions. This is concerning for lymphoma or other malignancy and clinical correlation is recommended. Stable severe enlargement of prostate gland is noted. Mild bilateral inguinal hernias are noted which contain loops of small bowel, but does not result in incarceration or obstruction. 2.4 cm fusiform right common iliac artery aneurysm. Aortic Atherosclerosis (ICD10-I70.0). Electronically Signed: By: Marijo Conception M.D. On: 07/22/2018 15:51   US Abdomen Limited  Result Date: 07/22/2018 CLINICAL DATA:  Elevated liver function tests. EXAM: ULTRASOUND ABDOMEN LIMITED RIGHT UPPER QUADRANT COMPARISON:  None. FINDINGS: Gallbladder: Multiple small less than 1 cm gallstones are seen. The gallbladder is nondilated. Diffuse gallbladder wall thickening is seen measuring up to 6 mm. No evidence of pericholecystic fluid. No sonographic Murphy sign noted by sonographer. Common bile duct: Diameter: 4 mm, within normal limits. Liver: No focal lesion identified. Within normal limits in parenchymal  echogenicity. Portal vein is patent on color Doppler imaging with normal direction of blood flow towards the liver. IMPRESSION: Cholelithiasis. Nondilated gallbladder with diffuse wall thickening, 5 without pericholecystic fluid or sonographic Murphy's sign. These findings are nonspecific. Chronic cholecystitis cannot be excluded. No evidence of biliary ductal dilatation. Unremarkable sonographic appearance of liver. Electronically Signed   By: Earle Gell M.D.   On: 07/22/2018 20:29      Subjective: - no chest pain, shortness of breath, no abdominal pain, nausea or vomiting.   Discharge Exam: BP (!) 95/50    Pulse (!) 102    Temp 97.7 F (36.5 C) (Oral)    Resp 17    Ht 5' 8"  (1.727 m)    Wt 74.2 kg    SpO2 95%    BMI 24.86 kg/m   General: Pt is alert, awake, not in acute distress Cardiovascular: RRR, S1/S2 +, no rubs, no gallops Respiratory: CTA bilaterally, no wheezing, no rhonchi Abdominal: Soft, NT, ND, bowel sounds + Extremities: no edema, no cyanosis    The results of significant diagnostics from this hospitalization (including imaging, microbiology, ancillary and laboratory) are listed below for reference.     Microbiology: No results found for this or any previous visit (from the past 240 hour(s)).   Labs: BNP (last 3 results) No results for input(s): BNP in the last 8760 hours. Basic Metabolic Panel: Recent Labs  Lab 07/22/18 1302 07/23/18 0303 07/24/18 0158 07/25/18 0223  NA 134* 134* 137 139  K 4.4 4.0 4.0 4.0  CL 97* 103 105 106  CO2 21* 24 21* 23  GLUCOSE 102* 86 94 93  BUN 23 20 17 17   CREATININE 1.65* 1.25* 1.21 1.13  CALCIUM 8.8* 8.3* 8.3* 8.3*   Liver Function Tests: Recent Labs  Lab 07/22/18 1302 07/23/18 0303 07/24/18 0158  AST 93* 69* 71*  ALT 40 33 34  ALKPHOS 301* 225* 236*  BILITOT 4.9* 3.3* 2.4*  PROT 6.8 4.8* 4.6*  ALBUMIN 3.7 2.8* 2.6*   Recent Labs  Lab 07/22/18 1302  LIPASE 44   No results for input(s): AMMONIA in the last  168 hours. CBC: Recent Labs  Lab 07/22/18 1302 07/23/18 0303 07/24/18 0158 07/25/18 0223  WBC 2.9* 2.1* 1.9* 2.2*  NEUTROABS 1.8 0.9* 0.9* 0.9*  HGB 12.4* 9.8* 10.1* 10.1*  HCT 38.8* 30.2* 32.3* 31.8*  MCV 82.9 82.1 82.8 82.8  PLT 49* 47* 48* 47*   Cardiac Enzymes: No results for input(s): CKTOTAL, CKMB, CKMBINDEX, TROPONINI in the last 168 hours. BNP: Invalid input(s): POCBNP CBG: No results for input(s): GLUCAP in the last 168 hours. D-Dimer No results for input(s): DDIMER in the last 72 hours. Hgb A1c No results for input(s): HGBA1C in the last 72 hours. Lipid Profile Recent Labs    07/23/18 0303  CHOL 124  HDL <10*  LDLCALC NOT CALCULATED  TRIG 202*  CHOLHDL NOT CALCULATED   Thyroid function studies No results for input(s): TSH, T4TOTAL, T3FREE, THYROIDAB in the last 72 hours.  Invalid input(s): FREET3 Anemia work up No results for input(s): VITAMINB12, FOLATE, FERRITIN, TIBC, IRON, RETICCTPCT in the last 72 hours. Urinalysis    Component Value Date/Time   COLORURINE AMBER (A) 07/22/2018 1308   APPEARANCEUR HAZY (A) 07/22/2018 1308   LABSPEC 1.027 07/22/2018 1308   PHURINE 5.0 07/22/2018 1308   GLUCOSEU NEGATIVE 07/22/2018 1308   HGBUR SMALL (A) 07/22/2018 1308   BILIRUBINUR SMALL (A) 07/22/2018 1308   KETONESUR 5 (A) 07/22/2018 1308   PROTEINUR 100 (A) 07/22/2018 1308   UROBILINOGEN 0.2 07/10/2007 1617   NITRITE NEGATIVE 07/22/2018 1308   LEUKOCYTESUR NEGATIVE 07/22/2018 1308   Sepsis Labs Invalid input(s): PROCALCITONIN,  WBC,  LACTICIDVEN  FURTHER DISCHARGE INSTRUCTIONS:   Get Medicines reviewed and adjusted: Please take all your medications with you for your next visit with your Primary MD   Laboratory/radiological data: Please request your Primary MD to go over all hospital tests and procedure/radiological results at the follow up, please ask your Primary MD to get all Hospital records sent to his/her office.   In some cases, they will be  blood work, cultures and biopsy results pending at the time of your discharge. Please request that your primary care M.D. goes through all the records of your hospital data and follows up on these results.   Also Note the following: If you experience worsening of your admission symptoms, develop shortness of breath, life threatening emergency, suicidal or homicidal thoughts you must seek medical attention immediately by calling 911 or calling your MD immediately  if symptoms less severe.   You must read complete instructions/literature along with all the possible adverse reactions/side effects for all the Medicines you take and that have been prescribed to you. Take any new Medicines after you have completely understood and accpet all the possible adverse reactions/side effects.    Do not drive when taking Pain medications or sleeping medications (Benzodaizepines)   Do not take more than prescribed Pain, Sleep and Anxiety Medications. It is not advisable to combine anxiety,sleep and pain medications without talking with your primary care practitioner   Special Instructions: If you have smoked or chewed Tobacco  in the last 2 yrs please stop smoking, stop any regular Alcohol  and or any Recreational drug use.   Wear Seat belts while driving.   Please note:  You were cared for by a hospitalist during your hospital stay. Once you are discharged, your primary care physician will handle any further medical issues. Please note that NO REFILLS for any discharge medications will be authorized once you are discharged, as it is imperative that you return to your primary care physician (or establish a relationship with a primary care physician if you do not have one) for your post hospital discharge needs so that they can reassess your need for medications and monitor your lab values.  Time coordinating discharge: 40 minutes  SIGNED:  Marzetta Board, MD, PhD 07/25/2018, 2:14 PM

## 2018-07-27 ENCOUNTER — Telehealth: Payer: Self-pay | Admitting: *Deleted

## 2018-07-27 NOTE — Telephone Encounter (Signed)
Called pt and LVM regarding Pet Scan schedule.  Pet scan has been scheduled for Tuesday Aug 11, 2018 at [facility] at 12:00 pm.  Pt will need to arrive at 11:30 am, NPO after midnight the night before. Left cancer center number for him to return the call if he has any questions.

## 2018-08-03 ENCOUNTER — Encounter (HOSPITAL_COMMUNITY): Payer: Self-pay

## 2018-08-03 ENCOUNTER — Other Ambulatory Visit: Payer: Self-pay

## 2018-08-03 ENCOUNTER — Inpatient Hospital Stay (HOSPITAL_COMMUNITY)
Admission: EM | Admit: 2018-08-03 | Discharge: 2018-08-19 | DRG: 682 | Disposition: A | Discharge status: Hospice | Payer: Medicare Other | Attending: Internal Medicine | Admitting: Internal Medicine

## 2018-08-03 ENCOUNTER — Telehealth: Payer: Self-pay

## 2018-08-03 ENCOUNTER — Emergency Department (HOSPITAL_COMMUNITY): Payer: Medicare Other

## 2018-08-03 ENCOUNTER — Telehealth: Payer: Self-pay | Admitting: *Deleted

## 2018-08-03 DIAGNOSIS — E790 Hyperuricemia without signs of inflammatory arthritis and tophaceous disease: Secondary | ICD-10-CM | POA: Diagnosis present

## 2018-08-03 DIAGNOSIS — I493 Ventricular premature depolarization: Secondary | ICD-10-CM | POA: Diagnosis not present

## 2018-08-03 DIAGNOSIS — D693 Immune thrombocytopenic purpura: Secondary | ICD-10-CM | POA: Diagnosis present

## 2018-08-03 DIAGNOSIS — E43 Unspecified severe protein-calorie malnutrition: Secondary | ICD-10-CM | POA: Diagnosis not present

## 2018-08-03 DIAGNOSIS — R0602 Shortness of breath: Secondary | ICD-10-CM | POA: Diagnosis not present

## 2018-08-03 DIAGNOSIS — E785 Hyperlipidemia, unspecified: Secondary | ICD-10-CM | POA: Diagnosis present

## 2018-08-03 DIAGNOSIS — Z20828 Contact with and (suspected) exposure to other viral communicable diseases: Secondary | ICD-10-CM | POA: Diagnosis not present

## 2018-08-03 DIAGNOSIS — R600 Localized edema: Secondary | ICD-10-CM | POA: Diagnosis not present

## 2018-08-03 DIAGNOSIS — T50995A Adverse effect of other drugs, medicaments and biological substances, initial encounter: Secondary | ICD-10-CM | POA: Diagnosis not present

## 2018-08-03 DIAGNOSIS — E860 Dehydration: Secondary | ICD-10-CM | POA: Diagnosis present

## 2018-08-03 DIAGNOSIS — E7800 Pure hypercholesterolemia, unspecified: Secondary | ICD-10-CM | POA: Diagnosis present

## 2018-08-03 DIAGNOSIS — R161 Splenomegaly, not elsewhere classified: Secondary | ICD-10-CM | POA: Diagnosis present

## 2018-08-03 DIAGNOSIS — Y92239 Unspecified place in hospital as the place of occurrence of the external cause: Secondary | ICD-10-CM | POA: Diagnosis not present

## 2018-08-03 DIAGNOSIS — R9431 Abnormal electrocardiogram [ECG] [EKG]: Secondary | ICD-10-CM | POA: Diagnosis present

## 2018-08-03 DIAGNOSIS — J181 Lobar pneumonia, unspecified organism: Secondary | ICD-10-CM

## 2018-08-03 DIAGNOSIS — T8089XA Other complications following infusion, transfusion and therapeutic injection, initial encounter: Secondary | ICD-10-CM | POA: Diagnosis not present

## 2018-08-03 DIAGNOSIS — Z6825 Body mass index (BMI) 25.0-25.9, adult: Secondary | ICD-10-CM

## 2018-08-03 DIAGNOSIS — R748 Abnormal levels of other serum enzymes: Secondary | ICD-10-CM | POA: Diagnosis present

## 2018-08-03 DIAGNOSIS — K921 Melena: Secondary | ICD-10-CM | POA: Diagnosis not present

## 2018-08-03 DIAGNOSIS — E883 Tumor lysis syndrome: Secondary | ICD-10-CM | POA: Diagnosis present

## 2018-08-03 DIAGNOSIS — R609 Edema, unspecified: Secondary | ICD-10-CM

## 2018-08-03 DIAGNOSIS — R0600 Dyspnea, unspecified: Secondary | ICD-10-CM | POA: Diagnosis present

## 2018-08-03 DIAGNOSIS — K7460 Unspecified cirrhosis of liver: Secondary | ICD-10-CM | POA: Diagnosis present

## 2018-08-03 DIAGNOSIS — R7989 Other specified abnormal findings of blood chemistry: Secondary | ICD-10-CM | POA: Diagnosis present

## 2018-08-03 DIAGNOSIS — E8770 Fluid overload, unspecified: Secondary | ICD-10-CM | POA: Diagnosis present

## 2018-08-03 DIAGNOSIS — Z66 Do not resuscitate: Secondary | ICD-10-CM | POA: Diagnosis present

## 2018-08-03 DIAGNOSIS — R945 Abnormal results of liver function studies: Secondary | ICD-10-CM | POA: Diagnosis not present

## 2018-08-03 DIAGNOSIS — Z79899 Other long term (current) drug therapy: Secondary | ICD-10-CM

## 2018-08-03 DIAGNOSIS — T451X5A Adverse effect of antineoplastic and immunosuppressive drugs, initial encounter: Secondary | ICD-10-CM | POA: Diagnosis not present

## 2018-08-03 DIAGNOSIS — D469 Myelodysplastic syndrome, unspecified: Secondary | ICD-10-CM | POA: Diagnosis present

## 2018-08-03 DIAGNOSIS — D61818 Other pancytopenia: Secondary | ICD-10-CM | POA: Diagnosis present

## 2018-08-03 DIAGNOSIS — R918 Other nonspecific abnormal finding of lung field: Secondary | ICD-10-CM | POA: Diagnosis not present

## 2018-08-03 DIAGNOSIS — D696 Thrombocytopenia, unspecified: Secondary | ICD-10-CM | POA: Diagnosis not present

## 2018-08-03 DIAGNOSIS — N179 Acute kidney failure, unspecified: Secondary | ICD-10-CM | POA: Diagnosis not present

## 2018-08-03 DIAGNOSIS — J189 Pneumonia, unspecified organism: Secondary | ICD-10-CM | POA: Diagnosis present

## 2018-08-03 DIAGNOSIS — N183 Chronic kidney disease, stage 3 (moderate): Secondary | ICD-10-CM | POA: Diagnosis present

## 2018-08-03 DIAGNOSIS — G4700 Insomnia, unspecified: Secondary | ICD-10-CM | POA: Diagnosis present

## 2018-08-03 DIAGNOSIS — D701 Agranulocytosis secondary to cancer chemotherapy: Secondary | ICD-10-CM

## 2018-08-03 DIAGNOSIS — C8510 Unspecified B-cell lymphoma, unspecified site: Secondary | ICD-10-CM | POA: Diagnosis present

## 2018-08-03 DIAGNOSIS — Z515 Encounter for palliative care: Secondary | ICD-10-CM | POA: Diagnosis not present

## 2018-08-03 DIAGNOSIS — Z87891 Personal history of nicotine dependence: Secondary | ICD-10-CM

## 2018-08-03 DIAGNOSIS — Y95 Nosocomial condition: Secondary | ICD-10-CM | POA: Diagnosis present

## 2018-08-03 DIAGNOSIS — I952 Hypotension due to drugs: Secondary | ICD-10-CM | POA: Diagnosis not present

## 2018-08-03 DIAGNOSIS — R509 Fever, unspecified: Secondary | ICD-10-CM | POA: Diagnosis not present

## 2018-08-03 LAB — CBC WITH DIFFERENTIAL/PLATELET
Abs Immature Granulocytes: 0.03 10*3/uL (ref 0.00–0.07)
Basophils Absolute: 0 10*3/uL (ref 0.0–0.1)
Basophils Relative: 0 %
Eosinophils Absolute: 0 10*3/uL (ref 0.0–0.5)
Eosinophils Relative: 0 %
HCT: 40.7 % (ref 39.0–52.0)
Hemoglobin: 13 g/dL (ref 13.0–17.0)
Immature Granulocytes: 2 %
Lymphocytes Relative: 12 %
Lymphs Abs: 0.2 10*3/uL — ABNORMAL LOW (ref 0.7–4.0)
MCH: 26.5 pg (ref 26.0–34.0)
MCHC: 31.9 g/dL (ref 30.0–36.0)
MCV: 82.9 fL (ref 80.0–100.0)
Monocytes Absolute: 0.2 10*3/uL (ref 0.1–1.0)
Monocytes Relative: 9 %
Neutro Abs: 1.4 10*3/uL — ABNORMAL LOW (ref 1.7–7.7)
Neutrophils Relative %: 77 %
Platelets: 18 10*3/uL — CL (ref 150–400)
RBC: 4.91 MIL/uL (ref 4.22–5.81)
RDW: 17.9 % — ABNORMAL HIGH (ref 11.5–15.5)
WBC: 1.9 10*3/uL — ABNORMAL LOW (ref 4.0–10.5)
nRBC: 1.1 % — ABNORMAL HIGH (ref 0.0–0.2)

## 2018-08-03 LAB — LIPASE, BLOOD: Lipase: 68 U/L — ABNORMAL HIGH (ref 11–51)

## 2018-08-03 LAB — BRAIN NATRIURETIC PEPTIDE: B Natriuretic Peptide: 73 pg/mL (ref 0.0–100.0)

## 2018-08-03 LAB — URINALYSIS, ROUTINE W REFLEX MICROSCOPIC
Bilirubin Urine: NEGATIVE
Glucose, UA: NEGATIVE mg/dL
Hgb urine dipstick: NEGATIVE
Ketones, ur: NEGATIVE mg/dL
Leukocytes,Ua: NEGATIVE
Nitrite: NEGATIVE
Protein, ur: NEGATIVE mg/dL
Specific Gravity, Urine: 1.021 (ref 1.005–1.030)
pH: 5 (ref 5.0–8.0)

## 2018-08-03 LAB — PROCALCITONIN: Procalcitonin: 0.2 ng/mL

## 2018-08-03 LAB — SARS CORONAVIRUS 2 BY RT PCR (HOSPITAL ORDER, PERFORMED IN ~~LOC~~ HOSPITAL LAB): SARS Coronavirus 2: NEGATIVE

## 2018-08-03 LAB — COMPREHENSIVE METABOLIC PANEL
ALT: 24 U/L (ref 0–44)
AST: 66 U/L — ABNORMAL HIGH (ref 15–41)
Albumin: 2.5 g/dL — ABNORMAL LOW (ref 3.5–5.0)
Alkaline Phosphatase: 233 U/L — ABNORMAL HIGH (ref 38–126)
Anion gap: 16 — ABNORMAL HIGH (ref 5–15)
BUN: 73 mg/dL — ABNORMAL HIGH (ref 8–23)
CO2: 16 mmol/L — ABNORMAL LOW (ref 22–32)
Calcium: 7.8 mg/dL — ABNORMAL LOW (ref 8.9–10.3)
Chloride: 101 mmol/L (ref 98–111)
Creatinine, Ser: 2.32 mg/dL — ABNORMAL HIGH (ref 0.61–1.24)
GFR calc Af Amer: 29 mL/min — ABNORMAL LOW (ref >60)
GFR calc non Af Amer: 25 mL/min — ABNORMAL LOW (ref >60)
Glucose, Bld: 124 mg/dL — ABNORMAL HIGH (ref 70–99)
Potassium: 4.8 mmol/L (ref 3.5–5.1)
Sodium: 133 mmol/L — ABNORMAL LOW (ref 135–145)
Total Bilirubin: 5.9 mg/dL — ABNORMAL HIGH (ref 0.3–1.2)
Total Protein: 5.1 g/dL — ABNORMAL LOW (ref 6.5–8.1)

## 2018-08-03 LAB — STREP PNEUMONIAE URINARY ANTIGEN: Strep Pneumo Urinary Antigen: NEGATIVE

## 2018-08-03 LAB — TROPONIN I: Troponin I: <0.03 ng/mL (ref <0.03)

## 2018-08-03 MED ORDER — FENTANYL CITRATE (PF) 100 MCG/2ML IJ SOLN: 25.0000 ug | INTRAMUSCULAR | Status: DC | PRN

## 2018-08-03 MED ORDER — ALBUTEROL SULFATE (2.5 MG/3ML) 0.083% IN NEBU: 2.5000 mg | INHALATION_SOLUTION | RESPIRATORY_TRACT | Status: DC | PRN

## 2018-08-03 MED ORDER — SODIUM CHLORIDE 0.9 % IV SOLN
2.0000 g | Freq: Once | INTRAVENOUS | Status: AC
  Administered 2018-08-03: 15:00:00 2 g via INTRAVENOUS
  Filled 2018-08-03: qty 2

## 2018-08-03 MED ORDER — SODIUM CHLORIDE 0.9 % IV BOLUS
1000.0000 mL | Freq: Once | INTRAVENOUS | Status: AC
  Administered 2018-08-03: 1000 mL via INTRAVENOUS

## 2018-08-03 MED ORDER — VANCOMYCIN HCL 10 G IV SOLR
1500.0000 mg | Freq: Once | INTRAVENOUS | Status: AC
  Administered 2018-08-03: 1500 mg via INTRAVENOUS
  Filled 2018-08-03: qty 1500

## 2018-08-03 MED ORDER — GUAIFENESIN ER 600 MG PO TB12
600.0000 mg | ORAL_TABLET | Freq: Two times a day (BID) | ORAL | Status: DC
  Administered 2018-08-03 – 2018-08-14 (×19): 600 mg via ORAL
  Filled 2018-08-03 (×30): qty 1

## 2018-08-03 MED ORDER — SODIUM CHLORIDE 0.9 % IV SOLN
INTRAVENOUS | Status: DC
  Administered 2018-08-03 – 2018-08-08 (×9): via INTRAVENOUS

## 2018-08-03 MED ORDER — VANCOMYCIN HCL 10 G IV SOLR
1250.0000 mg | INTRAVENOUS | Status: DC
  Filled 2018-08-03: qty 1250

## 2018-08-03 MED ORDER — ZOLPIDEM TARTRATE 5 MG PO TABS
5.0000 mg | ORAL_TABLET | Freq: Every day | ORAL | Status: DC
  Administered 2018-08-03 – 2018-08-18 (×15): 5 mg via ORAL
  Filled 2018-08-03 (×16): qty 1

## 2018-08-03 MED ORDER — OXYCODONE HCL 5 MG PO TABS
5.0000 mg | ORAL_TABLET | Freq: Four times a day (QID) | ORAL | Status: DC | PRN
  Administered 2018-08-17: 5 mg via ORAL
  Filled 2018-08-03: qty 1

## 2018-08-03 MED ORDER — OXYCODONE HCL 5 MG PO TABS: 5.0000 mg | ORAL_TABLET | ORAL | Status: DC | PRN

## 2018-08-03 MED ORDER — TRAMADOL HCL 50 MG PO TABS: 50.0000 mg | ORAL_TABLET | Freq: Four times a day (QID) | ORAL | Status: DC | PRN

## 2018-08-03 MED ORDER — SODIUM CHLORIDE 0.9 % IV SOLN
2.0000 g | INTRAVENOUS | Status: DC
  Administered 2018-08-04: 2 g via INTRAVENOUS
  Filled 2018-08-03 (×2): qty 2

## 2018-08-03 NOTE — H&P (Signed)
History and Physical    Zachary Bennett PNT:[id number] DOB: 1934/06/14 DOA: 08/03/2018  Referring MD/NP/PA: Delia Heady, PA-C PCP: Nicholas Lose, MD  Patient coming from: Home   Chief Complaint: Abdominal pain and shortness of breath  I have personally briefly reviewed patient's old medical records in [place]   HPI: Zachary Bennett is a 84 y.o. male with medical history significant of thrombocytopenia and hyperlipidemia; who presents with complaints of abdominal pain and worsening shortness of breath.  Patient had just been admitted to the hospital from 4/29-5/2 after being found to have splenomegaly with pancytopenia with imaging concerning for lymphoma.  Patient was evaluated by Dr. Lindi Adie and ultimately the plan had been for an outpatient PET scan for targeted biopsy of a hypermetabolic site.  However, patient reports that the PET scan was not scheduled until the 19th of this month.  Since being home he reports that he had decreased appetite unable to eat much solid food as he reports that tastes like leather and has a difficult time swallowing it.  Complains of generalized weakness for which he is unable to ambulate long distances and reports having difficulty getting off the toilet seat when he is having to use the restroom.  He also reports that he is having difficulty sleeping at night due to shortness of breath.  Other associated symptoms include reports of subjective feelings of warmth, loose stools, nausea, lower abdominal pain, unknown amount of weight loss, amber color to urine, and some lower extremity swelling.  Denies having any fever to his knowledge, chest pain, vomiting, or dysuria.  He was not prescribed anything for pain symptoms.    ED Course: Upon admission into the emergency department patient was noted to be afebrile, pulse 92-100, respirations 18-24, blood pressure 99/58-108/62, and O2 saturation maintained on room air.  Patient was placed on 2 L nasal cannula oxygen  for comfort.  Labs revealed WBC 1.9, platelets 18, sodium 133, CO2 16, BUN 73, creatinine 2.32, calcium 7.8, alkaline phosphatase 233, albumin 2.5, lipase 68, AST 66 , and total bilirubin 5.9.  Chest x-ray showed right lower lobe infiltrate concerning for possible pneumonia.  Patient was given 1 L normal saline IV fluids, vancomycin, and cefepime.  Review of Systems  Constitutional: Positive for malaise/fatigue and weight loss.  HENT: Negative for congestion and nosebleeds.   Eyes: Negative for discharge.  Respiratory: Positive for shortness of breath. Negative for cough.   Cardiovascular: Positive for leg swelling. Negative for chest pain.  Gastrointestinal: Positive for abdominal pain and nausea. Negative for vomiting.  Genitourinary: Negative for frequency.  Musculoskeletal: Negative for falls and myalgias.  Neurological: Positive for weakness. Negative for loss of consciousness.  Psychiatric/Behavioral: Negative for substance abuse. The patient has insomnia.   All other systems reviewed and are negative.   Past Medical History:  Diagnosis Date  . Abdominal hernia   . High cholesterol   . Thrombocytopenia ([place])     History reviewed. No pertinent surgical history.   reports that he quit smoking about 2 weeks ago. His smoking use included cigars and cigarettes. He has never used smokeless tobacco. He reports previous alcohol use. He reports previous drug use.  No Known Allergies  History reviewed. No pertinent family history.  Prior to Admission medications   Medication Sig Start Date End Date Taking? Authorizing Provider  pravastatin (PRAVACHOL) 40 MG tablet Take 40 mg by mouth daily.  04/06/18   [provider]    Physical Exam:  Constitutional: NAD, calm, comfortable  Vitals:   08/03/18 1300 08/03/18 1345 08/03/18 1415 08/03/18 1430  BP: 101/65 104/67 104/68 100/72  Pulse: 100 94 93 92  Resp: (!) 21 19 (!) 22 18  Temp:      TempSrc:      SpO2: 100% 100% 99%  99%   Eyes: PERRL, lids and conjunctivae normal ENMT: Mucous membranes are moist. Posterior pharynx clear of any exudate or lesions.Normal dentition.  Neck: normal, supple, no masses, no thyromegaly Respiratory: clear to auscultation bilaterally, no wheezing, no crackles. Normal respiratory effort. No accessory muscle use.  Cardiovascular: Regular rate and rhythm, no murmurs / rubs / gallops.  Trace lower extremity edema. 2+ pedal pulses. No carotid bruits.  Abdomen: Left-sided abdominal tenderness appreciated with splenomegaly noted. Musculoskeletal: no clubbing / cyanosis. No joint deformity upper and lower extremities. Good ROM, no contractures. Normal muscle tone.  Skin: no rashes, lesions, ulcers. No induration Neurologic: CN 2-12 grossly intact. Sensation intact, DTR normal. Strength 5/5 in all 4.  Psychiatric: Normal judgment and insight. Alert and oriented x 3. Normal mood.     Labs on Admission: I have personally reviewed following labs and imaging studies  CBC: Recent Labs  Lab 08/03/18 1302  WBC 1.9*  NEUTROABS 1.4*  HGB 13.0  HCT 40.7  MCV 82.9  PLT 18*   Basic Metabolic Panel: Recent Labs  Lab 08/03/18 1302  NA 133*  K 4.8  CL 101  CO2 16*  GLUCOSE 124*  BUN 73*  CREATININE 2.32*  CALCIUM 7.8*   GFR: Estimated Creatinine Clearance: 24.2 mL/min (A) (by C-G formula based on SCr of 2.32 mg/dL (H)). Liver Function Tests: Recent Labs  Lab 08/03/18 1302  AST 66*  ALT 24  ALKPHOS 233*  BILITOT 5.9*  PROT 5.1*  ALBUMIN 2.5*   Recent Labs  Lab 08/03/18 1302  LIPASE 68*   No results for input(s): AMMONIA in the last 168 hours. Coagulation Profile: No results for input(s): INR, PROTIME in the last 168 hours. Cardiac Enzymes: Recent Labs  Lab 08/03/18 1302  TROPONINI <0.03   BNP (last 3 results) No results for input(s): PROBNP in the last 8760 hours. HbA1C: No results for input(s): HGBA1C in the last 72 hours. CBG: No results for input(s):  GLUCAP in the last 168 hours. Lipid Profile: No results for input(s): CHOL, HDL, LDLCALC, TRIG, CHOLHDL, LDLDIRECT in the last 72 hours. Thyroid Function Tests: No results for input(s): TSH, T4TOTAL, FREET4, T3FREE, THYROIDAB in the last 72 hours. Anemia Panel: No results for input(s): VITAMINB12, FOLATE, FERRITIN, TIBC, IRON, RETICCTPCT in the last 72 hours. Urine analysis:    Component Value Date/Time   COLORURINE AMBER (A) 07/22/2018 1308   APPEARANCEUR HAZY (A) 07/22/2018 1308   LABSPEC 1.027 07/22/2018 1308   PHURINE 5.0 07/22/2018 1308   GLUCOSEU NEGATIVE 07/22/2018 1308   HGBUR SMALL (A) 07/22/2018 1308   BILIRUBINUR SMALL (A) 07/22/2018 1308   KETONESUR 5 (A) 07/22/2018 1308   PROTEINUR 100 (A) 07/22/2018 1308   UROBILINOGEN 0.2 07/10/2007 1617   NITRITE NEGATIVE 07/22/2018 1308   LEUKOCYTESUR NEGATIVE 07/22/2018 1308   Sepsis Labs: No results found for this or any previous visit (from the past 240 hour(s)).   Radiological Exams on Admission: Dg Chest 1 View  Result Date: 08/03/2018 CLINICAL DATA:  Shortness of breath and chest pain EXAM: CHEST  1 VIEW COMPARISON:  Chest radiograph July 10, 2007 and chest CT July 23, 2018 FINDINGS: There is ill-defined airspace consolidation in the medial right base. Lung bases  otherwise are clear. Heart size and pulmonary vascular normal. No adenopathy. There is aortic atherosclerosis. No bone lesions IMPRESSION: Ill-defined airspace opacity medial right base, concerning for pneumonia. Lungs elsewhere clear. Heart size normal. No adenopathy. Aortic Atherosclerosis (ICD10-I70.0). Electronically Signed   By: Lowella Grip III M.D.   On: 08/03/2018 13:48    EKG: Independently reviewed.  Sinus tachycardia 100 bpm QTc 503  Assessment/Plan Acute renal failure: Acute.  Patient reports decreased oral intake since recent discharge home.  Creatinine elevated at 2.33 with BUN 73.  The elevated BUN to creatinine ratio suggests prerenal cause of  symptoms.  Patient was given 1 L normal saline IV fluids in emergency department. -Admit to a telemetry bed -Normal saline IV fluids at 100 mL/h -Check urinalysis -Recheck BMP in a.m.  Dyspnea secondary to suspect community-acquired pneumonia: Acute.  Patient presents with complaints of shortness of breath.   Chest x-ray showing signs of a ill-defined airspace opacity in the right medial lung base concerning for possible pneumonia.  Given patient's complaints of shortness of breath suspect  -Nasal cannula oxygen as needed -Add on procalcitonin -Check sputum cultures and studies -Continue empiric antibiotics of vancomycin and cefepime given recent hospitalization   -Mucinex -Incentive spirometry   Abdominal pain 2/2 splenomegaly: Patient noted to have previous CT scan showing severe splenomegaly pretracheal lymph nodes and subcarinal lymph enlargement from 4/29.  Oncology on board and had recommended PET scan which is scheduled for 5/19, to evaluate most hypermetabolic area for biopsy.  -oxycodone/fentanyl as needed for abdominal pain  Pancytopenia: Acute on chronic. Patient presents with WBC 1.9 and platelet count 18.  Patient notes amber color to urine but denies any reports of bleeding. -Recheck CBC in a.m. -Appreciate Dr. Lindi Adie of oncology, who will follow up in a.m.  Elevated bilirubin/elevated LFTs: Total bilirubin obtained at 5.6 with elevations in alkaline phosphatase and AST.  Patient previously noted to have cholelithiasis without concerning symptoms for acute cholecystitis during last hospitalization. -May warrant reimaging if symptoms persist  Prolonged QT interval: On QTc503 on admission. -Avoid QT prolonging medication -Recheck EKG in a.m.  Hyperlipidemia -Hold pravastatin  Tobacco abuse: Patient reports that he quit smoking approximately 2 weeks ago.  Reports having over 50 smoking pack year history. -Encouraged continued cessation of tobacco use  Insomnia -Ambien  DVT prophylaxis: SCDs Code Status: Full Family Communication: No family present at bedside Disposition Plan: To be determined Consults called: Oncology Admission status: Observation  Norval Morton MD [facility] Pager [phone]   If 7PM-7AM, please contact night-coverage [email or web address] Password [email or web address]  08/03/2018, 3:20 PM

## 2018-08-03 NOTE — Telephone Encounter (Signed)
Received call from patient.  He states he was discharged from the hospital on 07/25/18 and continues to feel poorly. He states he is very weak, not eating well-just jello and fluids.  He states he is so weak , he can barely get to bathroom from his bed. He feels like he needs his CT Scan sooner than 08/11/18 bu, at the same time, he feels he is too weak to get there.Marland Kitchen He prefers an 'in-person' visit with Dr. Lindi Adie as well.  Please advise. Pt simply does not know what to do at this point.

## 2018-08-03 NOTE — ED Triage Notes (Signed)
Pt sent here by PCP for reevaluation of abd pain, pt recently discharged a couple weeks ago for the same but pt having continued pain.

## 2018-08-03 NOTE — ED Notes (Signed)
Nurse Navigator communication with updates at the patients request to his son Legrand Como. An update will be provided if the patient is admitted vs discharge. He appreciates the communication.

## 2018-08-03 NOTE — ED Notes (Signed)
Please call pts son Stefan Markarian [phone] for updates

## 2018-08-03 NOTE — ED Notes (Signed)
Help patient get undress into a gown on the monitor did ekg shown to Dr Vanita Panda patient is resting with call bell in reach

## 2018-08-03 NOTE — Progress Notes (Signed)
New Admission Note:   Arrival Method:  Bed Mental Orientation:  Telemetry: Box 19  Assessment: Completed Skin: Intact skin tags  NZ:DKEUV AC Pain: 0/10  Tubes:none  Safety Measures: Safety Fall Prevention Plan has been given, discussed and signed Admission: Completed [facility] Orientation: Patient has been orientated to the room, unit and staff.  Family: none   Orders have been reviewed and implemented. Will continue to monitor the patient. Call light has been placed within reach and bed alarm has been activated.   Tymeka Privette RN [facility] Phone: [phone]

## 2018-08-03 NOTE — Progress Notes (Signed)
Pharmacy Antibiotic Note  Shaya Altamura is a 84 y.o. male admitted on 08/03/2018 with pneumonia.  Pharmacy has been consulted for vancomycin dosing. Pt is afebrile and WBC is low. SCr is elevated well above baseline. Pt was recently discharged from the hospital.   Plan: Vancomycin 1500mg  IV x 1 then 1250mg  IV Q48H F/u renal fxn, C&S, clinical status and peak/trough at [facility] F/u continuation of cefepime or other gram negative coverage    Temp (24hrs), Avg:97.5 F (36.4 C), Min:97.5 F (36.4 C), Max:97.5 F (36.4 C)  Recent Labs  Lab 08/03/18 1302  WBC 1.9*  CREATININE 2.32*    Estimated Creatinine Clearance: 24.2 mL/min (A) (by C-G formula based on SCr of 2.32 mg/dL (H)).    No Known Allergies  Antimicrobials this admission: Vanc 5/11>> Cefepime x 1 5/11  Dose adjustments this admission: N/A  Microbiology results: Pending  Thank you for allowing pharmacy to be a part of this patient's care.  Demico Ploch, Rande Lawman 08/03/2018 2:26 PM

## 2018-08-03 NOTE — Telephone Encounter (Signed)
Nurse placed call to patient to follow up regarding previous triage call.   Pt reports he has a decline since discharge from hospital.  Pt reports weakness, having difficulty ambulating, and getting out of bed.  Dyspnea, nausea, and abdominal pain are present per patient.  Denies fever, but states "I get warm feeling." Reports amber colored urine. Not tolerating fluids, or foods at this time.  States he feels incoherent at times.    Nurse encouraged patient to report to ED, Dr. Lindi Adie in agreement with intervention.  Patient voiced agreement and understanding.  Nurse encouraged to have someone drive, pt reports son will be taking him.

## 2018-08-03 NOTE — ED Provider Notes (Addendum)
[facility] EMERGENCY DEPARTMENT Provider Note   CSN: [id number] Arrival date & time: 08/03/18  1227    History   Chief Complaint Chief Complaint  Patient presents with   Abdominal Pain    HPI Zachary Bennett is a 84 y.o. male with a past medical history of pancytopenia, splenomegaly, with recent admission from 4/29- 07/25/18 the above-mentioned issues presents to ED for progressively worsening generalized weakness, decreased appetite, nausea, generalized abdominal pain and shortness of breath.  States that since his discharge from the hospital his symptoms have worsened.  He feels that he is short of breath even while sitting down.  States that he is been too weak to ambulate but denies any injuries or falls.  He denies any changes to bowel movements, states that he is unable to tolerate any solid foods.  He denies chest pain, fever or changes to urination.     HPI  Past Medical History:  Diagnosis Date   Abdominal hernia    High cholesterol    Thrombocytopenia [facility])     Patient Active Problem List   Diagnosis Date Noted   AKI (acute kidney injury) ([place]) 07/22/2018   Elevated LFTs    Epigastric pain    Hyperbilirubinemia    Splenomegaly    Pancytopenia ([facility])    Thrombocytopenia ([place]) 06/04/2016    History reviewed. No pertinent surgical history.      Home Medications    Prior to Admission medications   Medication Sig Start Date End Date Taking? Authorizing Provider  pravastatin (PRAVACHOL) 40 MG tablet Take 40 mg by mouth daily.  04/06/18   [provider]    Family History No family history on file.  Social History Social History   Tobacco Use   Smoking status: Current Every Day Smoker   Smokeless tobacco: Never Used  Substance Use Topics   Alcohol use: Not on file   Drug use: Not on file     Allergies   Patient has no known allergies.   Review of Systems Review of Systems  Constitutional: Positive for  appetite change and fatigue. Negative for chills and fever.  HENT: Negative for ear pain, rhinorrhea, sneezing and sore throat.   Eyes: Negative for photophobia and visual disturbance.  Respiratory: Positive for shortness of breath. Negative for cough, chest tightness and wheezing.   Cardiovascular: Negative for chest pain and palpitations.  Gastrointestinal: Positive for abdominal pain and nausea. Negative for blood in stool, constipation, diarrhea and vomiting.  Genitourinary: Negative for dysuria, hematuria and urgency.  Musculoskeletal: Negative for myalgias.  Skin: Negative for rash.  Neurological: Negative for dizziness, weakness and light-headedness.     Physical Exam Updated Vital Signs BP 100/72    Pulse 92    Temp (!) 97.5 F (36.4 C) (Oral)    Resp 18    SpO2 99%   Physical Exam Vitals signs and nursing note reviewed.  Constitutional:      General: He is not in acute distress.    Appearance: He is well-developed.  HENT:     Head: Normocephalic and atraumatic.     Nose: Nose normal.     Mouth/Throat:     Mouth: Mucous membranes are dry.  Eyes:     General: No scleral icterus.       Right eye: No discharge.        Left eye: No discharge.     Conjunctiva/sclera: Conjunctivae normal.  Neck:     Musculoskeletal: Normal range of motion and  neck supple.  Cardiovascular:     Rate and Rhythm: Regular rhythm. Tachycardia present.     Heart sounds: Normal heart sounds. No murmur. No friction rub. No gallop.   Pulmonary:     Effort: Pulmonary effort is normal. No respiratory distress.     Breath sounds: Normal breath sounds.  Abdominal:     General: Bowel sounds are normal. There is no distension.     Palpations: Abdomen is soft.     Tenderness: There is abdominal tenderness in the epigastric area and left upper quadrant. There is no guarding.  Musculoskeletal: Normal range of motion.     Right lower leg: Edema present.     Left lower leg: Edema present.     Comments:  1+ pitting edema in bilateral ankles.  Skin:    General: Skin is warm and dry.     Findings: No rash.  Neurological:     Mental Status: He is alert.     Motor: No abnormal muscle tone.     Coordination: Coordination normal.      ED Treatments / Results  Labs (all labs ordered are listed, but only abnormal results are displayed) Labs Reviewed  COMPREHENSIVE METABOLIC PANEL - Abnormal; Notable for the following components:      Result Value   Sodium 133 (*)    CO2 16 (*)    Glucose, Bld 124 (*)    BUN 73 (*)    Creatinine, Ser 2.32 (*)    Calcium 7.8 (*)    Total Protein 5.1 (*)    Albumin 2.5 (*)    AST 66 (*)    Alkaline Phosphatase 233 (*)    Total Bilirubin 5.9 (*)    GFR calc non Af Amer 25 (*)    GFR calc Af Amer 29 (*)    Anion gap 16 (*)    All other components within normal limits  CBC WITH DIFFERENTIAL/PLATELET - Abnormal; Notable for the following components:   WBC 1.9 (*)    RDW 17.9 (*)    Platelets 18 (*)    nRBC 1.1 (*)    Neutro Abs 1.4 (*)    Lymphs Abs 0.2 (*)    All other components within normal limits  LIPASE, BLOOD - Abnormal; Notable for the following components:   Lipase 68 (*)    All other components within normal limits  SARS CORONAVIRUS 2 (HOSPITAL ORDER, [place] [facility])  TROPONIN I  BRAIN NATRIURETIC PEPTIDE    EKG None  Radiology Dg Chest 1 View  Result Date: 08/03/2018 CLINICAL DATA:  Shortness of breath and chest pain EXAM: CHEST  1 VIEW COMPARISON:  Chest radiograph July 10, 2007 and chest CT July 23, 2018 FINDINGS: There is ill-defined airspace consolidation in the medial right base. Lung bases otherwise are clear. Heart size and pulmonary vascular normal. No adenopathy. There is aortic atherosclerosis. No bone lesions IMPRESSION: Ill-defined airspace opacity medial right base, concerning for pneumonia. Lungs elsewhere clear. Heart size normal. No adenopathy. Aortic Atherosclerosis (ICD10-I70.0).  Electronically Signed   By: Lowella Grip III M.D.   On: 08/03/2018 13:48    Procedures .Critical Care Performed by: Delia Heady, PA-C Authorized by: Delia Heady, PA-C   Critical care provider statement:    Critical care time (minutes):  35   Critical care was necessary to treat or prevent imminent or life-threatening deterioration of the following conditions:  Renal failure   Critical care was time spent personally by me on  the following activities:  Discussions with consultants, ordering and performing treatments and interventions, re-evaluation of patient's condition, ordering and review of laboratory studies, ordering and review of radiographic studies, examination of patient and obtaining history from patient or surrogate   (including critical care time)  Medications Ordered in ED Medications  vancomycin (VANCOCIN) 1,500 mg in sodium chloride 0.9 % 500 mL IVPB (has no administration in time range)  vancomycin (VANCOCIN) 1,250 mg in sodium chloride 0.9 % 250 mL IVPB (has no administration in time range)  ceFEPIme (MAXIPIME) 2 g in sodium chloride 0.9 % 100 mL IVPB (2 g Intravenous New Bag/Given 08/03/18 1442)  sodium chloride 0.9 % bolus 1,000 mL (1,000 mLs Intravenous New Bag/Given 08/03/18 1439)     Initial Impression / Assessment and Plan / ED Course  I have reviewed the triage vital signs and the nursing notes.  Pertinent labs & imaging results that were available during my care of the patient were reviewed by me and considered in my medical decision making (see chart for details).        Kairav Russomanno was evaluated in Emergency Department on 08/03/18  for the symptoms described in the history of present illness. He/she was evaluated in the context of the global COVID-19 pandemic, which necessitated consideration that the patient might be at risk for infection with the SARS-CoV-2 virus that causes COVID-19. Institutional protocols and algorithms that pertain to the  evaluation of patients at risk for COVID-19 are in a state of rapid change based on information released by regulatory bodies including the CDC and federal and state organizations. These policies and algorithms were followed during the patient's care in the ED.  84 year old male with history of thrombocytopenia, recent admission for pancytopenia and splenomegaly, possible lymphoma presents for ongoing generalized weakness, nausea, decreased appetite and abdominal pain.  He reports unable to tolerate solid foods.  He has not yet completed a PET scan which is scheduled for next week.  Lab work shows leukopenia 1.9, thrombocytopenia at 18 which is lower than his baseline.  He does endorse shortness of breath but denies any cough or sick contacts.  However, he was hospitalized earlier in the month.  He is afebrile here.  Denies any chest pain.  Peripheral edema noted in bilateral lower extremities.  EKG shows sinus tachycardia with prolonged QT.  Creatinine elevated at 2.3, his baseline is around 1.  Continues to have hyperbilirubinemia at 5.9, anion gap of 16, CO2 of 16.  Lipase mildly elevated.  Chest x-ray shows possible right-sided pneumonia.  I feel that patient will benefit from admission for ongoing hydration for AKI as well as treatment for H CAP and can hopefully work-up his chronic medical issues including PET scan.  Will consult hospitalist.  COVID testing pending.    Portions of this note were generated with [employer]. Dictation errors may occur despite best attempts at proofreading.  Final Clinical Impressions(s) / ED Diagnoses   Final diagnoses:  AKI (acute kidney injury) ([place])  Healthcare-associated pneumonia  Dehydration  Hyperbilirubinemia  Peripheral edema    ED Discharge Orders    None        Delia Heady, PA-C 08/03/18 1519    Carmin Muskrat, MD 08/07/18 [phone]

## 2018-08-03 NOTE — ED Notes (Signed)
ED TO INPATIENT HANDOFF REPORT  ED Nurse Name and Phone #: Judson Roch [phone]  S Name/Age/Gender Zachary Bennett 84 y.o. male Room/Bed: 035C/035C  Code Status   Code Status: Full Code  Home/SNF/Other Home Patient oriented to: self, place, time and situation Is this baseline? Yes   Triage Complete: Triage complete  Chief Complaint Abd Pain  Triage Note Pt sent here by PCP for reevaluation of abd pain, pt recently discharged a couple weeks ago for the same but pt having continued pain.    Allergies No Known Allergies  Level of Care/Admitting Diagnosis ED Disposition    ED Disposition Condition Comment   Admit  Hospital Area: [facility] [[id number]]  Level of Care: Telemetry Medical [104]  I expect the patient will be discharged within 24 hours: No (not a candidate for 5C-Observation unit)  Covid Evaluation: Screening Protocol (No Symptoms)  Diagnosis: ARF (acute renal failure) [facility]) [[id number]]  Admitting Physician: Norval Morton [[id number]]  Attending Physician: Norval Morton [[id number]]  PT Class (Do Not Modify): Observation [104]  PT Acc Code (Do Not Modify): Observation [10022]       B Medical/Surgery History Past Medical History:  Diagnosis Date  . Abdominal hernia   . High cholesterol   . Thrombocytopenia ([place])    History reviewed. No pertinent surgical history.   A IV Location/Drains/Wounds Patient Lines/Drains/Airways Status   Active Line/Drains/Airways    Name:   Placement date:   Placement time:   Site:   Days:   Peripheral IV 08/03/18 Right Antecubital   08/03/18    1325    Antecubital   less than 1          Intake/Output Last 24 hours  Intake/Output Summary (Last 24 hours) at 08/03/2018 1550 Last data filed at 08/03/2018 1512 Gross per 24 hour  Intake 1000 ml  Output -  Net 1000 ml    Labs/Imaging Results for orders placed or performed during the hospital encounter of 08/03/18 (from the past 48 hour(s))  Troponin I - Once      Status: None   Collection Time: 08/03/18  1:02 PM  Result Value Ref Range   Troponin I <0.03 <0.03 ng/mL    Comment: Performed at [facility], [place] [address] [place], [place] [address]  Comprehensive metabolic panel     Status: Abnormal   Collection Time: 08/03/18  1:02 PM  Result Value Ref Range   Sodium 133 (L) 135 - 145 mmol/L   Potassium 4.8 3.5 - 5.1 mmol/L   Chloride 101 98 - 111 mmol/L   CO2 16 (L) 22 - 32 mmol/L   Glucose, Bld 124 (H) 70 - 99 mg/dL   BUN 73 (H) 8 - 23 mg/dL   Creatinine, Ser 2.32 (H) 0.61 - 1.24 mg/dL   Calcium 7.8 (L) 8.9 - 10.3 mg/dL   Total Protein 5.1 (L) 6.5 - 8.1 g/dL   Albumin 2.5 (L) 3.5 - 5.0 g/dL   AST 66 (H) 15 - 41 U/L   ALT 24 0 - 44 U/L   Alkaline Phosphatase 233 (H) 38 - 126 U/L   Total Bilirubin 5.9 (H) 0.3 - 1.2 mg/dL   GFR calc non Af Amer 25 (L) >60 mL/min   GFR calc Af Amer 29 (L) >60 mL/min   Anion gap 16 (H) 5 - 15    Comment: Performed at [facility], [place] [address], [place], [place] [address]  CBC with Differential  Status: Abnormal   Collection Time: 08/03/18  1:02 PM  Result Value Ref Range   WBC 1.9 (L) 4.0 - 10.5 K/uL   RBC 4.91 4.22 - 5.81 MIL/uL   Hemoglobin 13.0 13.0 - 17.0 g/dL   HCT 40.7 39.0 - 52.0 %   MCV 82.9 80.0 - 100.0 fL   MCH 26.5 26.0 - 34.0 pg   MCHC 31.9 30.0 - 36.0 g/dL   RDW 17.9 (H) 11.5 - 15.5 %   Platelets 18 (LL) 150 - 400 K/uL    Comment: REPEATED TO VERIFY PLATELET COUNT CONFIRMED BY SMEAR SPECIMEN CHECKED FOR CLOTS Immature Platelet Fraction may be clinically indicated, consider ordering this additional test [id number] CRITICAL RESULT CALLED TO, READ BACK BY AND VERIFIED WITHGareth Eagle RN [id number] BY A BENNETT    nRBC 1.1 (H) 0.0 - 0.2 %   Neutrophils Relative % 77 %   Neutro Abs 1.4 (L) 1.7 - 7.7 K/uL   Lymphocytes Relative 12 %   Lymphs Abs 0.2 (L) 0.7 - 4.0 K/uL   Monocytes Relative 9 %   Monocytes Absolute 0.2 0.1 - 1.0 K/uL   Eosinophils Relative 0 %    Eosinophils Absolute 0.0 0.0 - 0.5 K/uL   Basophils Relative 0 %   Basophils Absolute 0.0 0.0 - 0.1 K/uL   Immature Granulocytes 2 %   Abs Immature Granulocytes 0.03 0.00 - 0.07 K/uL    Comment: Performed at [facility], [place] [address], [place], [place] [address]  Lipase, blood     Status: Abnormal   Collection Time: 08/03/18  1:02 PM  Result Value Ref Range   Lipase 68 (H) 11 - 51 U/L    Comment: Performed at [facility] [address] [place], [place] [address]  Brain natriuretic peptide     Status: None   Collection Time: 08/03/18  1:03 PM  Result Value Ref Range   B Natriuretic Peptide 73.0 0.0 - 100.0 pg/mL    Comment: Performed at [facility] [address] [place], [place] [address]   Dg Chest 1 View  Result Date: 08/03/2018 CLINICAL DATA:  Shortness of breath and chest pain EXAM: CHEST  1 VIEW COMPARISON:  Chest radiograph July 10, 2007 and chest CT July 23, 2018 FINDINGS: There is ill-defined airspace consolidation in the medial right base. Lung bases otherwise are clear. Heart size and pulmonary vascular normal. No adenopathy. There is aortic atherosclerosis. No bone lesions IMPRESSION: Ill-defined airspace opacity medial right base, concerning for pneumonia. Lungs elsewhere clear. Heart size normal. No adenopathy. Aortic Atherosclerosis (ICD10-I70.0). Electronically Signed   By: Lowella Grip III M.D.   On: 08/03/2018 13:48    Pending Labs Unresulted Labs (From admission, onward)    Start     Ordered   08/04/18 0500  CBC  Tomorrow morning,   R     08/03/18 1541   08/04/18 0500  Comprehensive metabolic panel  Tomorrow morning,   R     08/03/18 1541   08/04/18 0500  Procalcitonin  Daily,   R     08/03/18 1542   08/03/18 1544  Urinalysis, Routine w reflex microscopic  Once,   R     08/03/18 1543   08/03/18 1543  Procalcitonin - Baseline  Add-on,   STAT     08/03/18 1542   08/03/18 1541  Culture, blood (routine x 2) Call MD if unable to obtain  prior to antibiotics being given  BLOOD CULTURE  X 2,   R    Comments:  If blood cultures drawn in Emergency Department - Do not draw and cancel order   Question:  Patient immune status  Answer:  Immunocompromised   08/03/18 1541   08/03/18 1541  Culture, sputum-assessment  Once,   R    Question:  Patient immune status  Answer:  Immunocompromised   08/03/18 1541   08/03/18 1541  Gram stain  Once,   R    Question:  Patient immune status  Answer:  Immunocompromised   08/03/18 1541   08/03/18 1541  Strep pneumoniae urinary antigen  Once,   R     08/03/18 1541   08/03/18 1541  Legionella Pneumophila Serogp 1 Ur Ag  Once,   R     08/03/18 1541   08/03/18 1421  SARS Coronavirus 2 (CEPHEID- Performed in [facility] lab), Hosp Order  (Symptomatic Patients Labs with Precautions )  Once,   R     08/03/18 1420          Vitals/Pain Today's Vitals   08/03/18 1415 08/03/18 1430 08/03/18 1515 08/03/18 1530  BP: 104/68 100/72 100/64 92/72  Pulse: 93 92 94 93  Resp: (!) 22 18 (!) 21 19  Temp:      TempSrc:      SpO2: 99% 99% 100% 100%    Isolation Precautions Droplet and Contact precautions  Medications Medications  vancomycin (VANCOCIN) 1,500 mg in sodium chloride 0.9 % 500 mL IVPB (1,500 mg Intravenous New Bag/Given 08/03/18 1544)  vancomycin (VANCOCIN) 1,250 mg in sodium chloride 0.9 % 250 mL IVPB (has no administration in time range)  guaiFENesin (MUCINEX) 12 hr tablet 600 mg (has no administration in time range)  oxyCODONE (Oxy IR/ROXICODONE) immediate release tablet 5 mg (has no administration in time range)  albuterol (PROVENTIL) (2.5 MG/3ML) 0.083% nebulizer solution 2.5 mg (has no administration in time range)  ceFEPIme (MAXIPIME) 1 g in sodium chloride 0.9 % 100 mL IVPB (has no administration in time range)  0.9 %  sodium chloride infusion (has no administration in time range)  ceFEPIme (MAXIPIME) 2 g in sodium chloride 0.9 % 100 mL IVPB (0 g Intravenous Stopped 08/03/18  1512)  sodium chloride 0.9 % bolus 1,000 mL (1,000 mLs Intravenous New Bag/Given 08/03/18 1439)  sodium chloride 0.9 % bolus 1,000 mL (1,000 mLs Intravenous New Bag/Given 08/03/18 1541)    Mobility walks Low fall risk   Focused Assessments gi   R Recommendations: See Admitting Provider Note  Report given to:   Additional Notes:

## 2018-08-04 DIAGNOSIS — D61818 Other pancytopenia: Secondary | ICD-10-CM | POA: Diagnosis present

## 2018-08-04 DIAGNOSIS — N179 Acute kidney failure, unspecified: Secondary | ICD-10-CM | POA: Diagnosis present

## 2018-08-04 DIAGNOSIS — K921 Melena: Secondary | ICD-10-CM | POA: Diagnosis not present

## 2018-08-04 DIAGNOSIS — C8300 Small cell B-cell lymphoma, unspecified site: Secondary | ICD-10-CM | POA: Diagnosis not present

## 2018-08-04 DIAGNOSIS — Z6829 Body mass index (BMI) 29.0-29.9, adult: Secondary | ICD-10-CM | POA: Diagnosis not present

## 2018-08-04 DIAGNOSIS — K7460 Unspecified cirrhosis of liver: Secondary | ICD-10-CM | POA: Diagnosis present

## 2018-08-04 DIAGNOSIS — J189 Pneumonia, unspecified organism: Secondary | ICD-10-CM

## 2018-08-04 DIAGNOSIS — N183 Chronic kidney disease, stage 3 (moderate): Secondary | ICD-10-CM | POA: Diagnosis present

## 2018-08-04 DIAGNOSIS — D759 Disease of blood and blood-forming organs, unspecified: Secondary | ICD-10-CM | POA: Diagnosis not present

## 2018-08-04 DIAGNOSIS — E785 Hyperlipidemia, unspecified: Secondary | ICD-10-CM | POA: Diagnosis present

## 2018-08-04 DIAGNOSIS — E883 Tumor lysis syndrome: Secondary | ICD-10-CM | POA: Diagnosis present

## 2018-08-04 DIAGNOSIS — C8510 Unspecified B-cell lymphoma, unspecified site: Secondary | ICD-10-CM | POA: Diagnosis present

## 2018-08-04 DIAGNOSIS — Y92239 Unspecified place in hospital as the place of occurrence of the external cause: Secondary | ICD-10-CM | POA: Diagnosis not present

## 2018-08-04 DIAGNOSIS — D469 Myelodysplastic syndrome, unspecified: Secondary | ICD-10-CM | POA: Diagnosis present

## 2018-08-04 DIAGNOSIS — R590 Localized enlarged lymph nodes: Secondary | ICD-10-CM

## 2018-08-04 DIAGNOSIS — E860 Dehydration: Secondary | ICD-10-CM | POA: Diagnosis present

## 2018-08-04 DIAGNOSIS — E790 Hyperuricemia without signs of inflammatory arthritis and tophaceous disease: Secondary | ICD-10-CM

## 2018-08-04 DIAGNOSIS — R898 Other abnormal findings in specimens from other organs, systems and tissues: Secondary | ICD-10-CM | POA: Diagnosis not present

## 2018-08-04 DIAGNOSIS — T451X5A Adverse effect of antineoplastic and immunosuppressive drugs, initial encounter: Secondary | ICD-10-CM | POA: Diagnosis not present

## 2018-08-04 DIAGNOSIS — Z20828 Contact with and (suspected) exposure to other viral communicable diseases: Secondary | ICD-10-CM | POA: Diagnosis present

## 2018-08-04 DIAGNOSIS — R609 Edema, unspecified: Secondary | ICD-10-CM | POA: Diagnosis not present

## 2018-08-04 DIAGNOSIS — E8809 Other disorders of plasma-protein metabolism, not elsewhere classified: Secondary | ICD-10-CM

## 2018-08-04 DIAGNOSIS — D701 Agranulocytosis secondary to cancer chemotherapy: Secondary | ICD-10-CM | POA: Diagnosis not present

## 2018-08-04 DIAGNOSIS — Z66 Do not resuscitate: Secondary | ICD-10-CM

## 2018-08-04 DIAGNOSIS — Y95 Nosocomial condition: Secondary | ICD-10-CM | POA: Diagnosis present

## 2018-08-04 DIAGNOSIS — R9431 Abnormal electrocardiogram [ECG] [EKG]: Secondary | ICD-10-CM | POA: Diagnosis present

## 2018-08-04 DIAGNOSIS — R945 Abnormal results of liver function studies: Secondary | ICD-10-CM | POA: Diagnosis not present

## 2018-08-04 DIAGNOSIS — E7800 Pure hypercholesterolemia, unspecified: Secondary | ICD-10-CM | POA: Diagnosis present

## 2018-08-04 DIAGNOSIS — D47Z9 Other specified neoplasms of uncertain behavior of lymphoid, hematopoietic and related tissue: Secondary | ICD-10-CM | POA: Diagnosis not present

## 2018-08-04 DIAGNOSIS — Z87891 Personal history of nicotine dependence: Secondary | ICD-10-CM | POA: Diagnosis not present

## 2018-08-04 DIAGNOSIS — E43 Unspecified severe protein-calorie malnutrition: Secondary | ICD-10-CM | POA: Diagnosis present

## 2018-08-04 DIAGNOSIS — Z79899 Other long term (current) drug therapy: Secondary | ICD-10-CM | POA: Diagnosis not present

## 2018-08-04 DIAGNOSIS — D649 Anemia, unspecified: Secondary | ICD-10-CM | POA: Diagnosis not present

## 2018-08-04 DIAGNOSIS — G4700 Insomnia, unspecified: Secondary | ICD-10-CM | POA: Diagnosis present

## 2018-08-04 DIAGNOSIS — D693 Immune thrombocytopenic purpura: Secondary | ICD-10-CM | POA: Diagnosis present

## 2018-08-04 DIAGNOSIS — R0600 Dyspnea, unspecified: Secondary | ICD-10-CM | POA: Diagnosis not present

## 2018-08-04 DIAGNOSIS — E8770 Fluid overload, unspecified: Secondary | ICD-10-CM | POA: Diagnosis present

## 2018-08-04 DIAGNOSIS — R161 Splenomegaly, not elsewhere classified: Secondary | ICD-10-CM | POA: Diagnosis present

## 2018-08-04 DIAGNOSIS — D696 Thrombocytopenia, unspecified: Secondary | ICD-10-CM | POA: Diagnosis not present

## 2018-08-04 DIAGNOSIS — R630 Anorexia: Secondary | ICD-10-CM

## 2018-08-04 DIAGNOSIS — J181 Lobar pneumonia, unspecified organism: Secondary | ICD-10-CM | POA: Diagnosis not present

## 2018-08-04 LAB — URIC ACID: Uric Acid, Serum: 10.9 mg/dL — ABNORMAL HIGH (ref 3.7–8.6)

## 2018-08-04 LAB — COMPREHENSIVE METABOLIC PANEL
ALT: 20 U/L (ref 0–44)
AST: 51 U/L — ABNORMAL HIGH (ref 15–41)
Albumin: 1.9 g/dL — ABNORMAL LOW (ref 3.5–5.0)
Alkaline Phosphatase: 178 U/L — ABNORMAL HIGH (ref 38–126)
Anion gap: 13 (ref 5–15)
BUN: 59 mg/dL — ABNORMAL HIGH (ref 8–23)
CO2: 18 mmol/L — ABNORMAL LOW (ref 22–32)
Calcium: 7.1 mg/dL — ABNORMAL LOW (ref 8.9–10.3)
Chloride: 106 mmol/L (ref 98–111)
Creatinine, Ser: 1.68 mg/dL — ABNORMAL HIGH (ref 0.61–1.24)
GFR calc Af Amer: 43 mL/min — ABNORMAL LOW (ref >60)
GFR calc non Af Amer: 38 mL/min — ABNORMAL LOW (ref >60)
Glucose, Bld: 90 mg/dL (ref 70–99)
Potassium: 4.1 mmol/L (ref 3.5–5.1)
Sodium: 137 mmol/L (ref 135–145)
Total Bilirubin: 4.3 mg/dL — ABNORMAL HIGH (ref 0.3–1.2)
Total Protein: 3.7 g/dL — ABNORMAL LOW (ref 6.5–8.1)

## 2018-08-04 LAB — PROTIME-INR
INR: 1.4 — ABNORMAL HIGH (ref 0.8–1.2)
Prothrombin Time: 17.3 seconds — ABNORMAL HIGH (ref 11.4–15.2)

## 2018-08-04 LAB — CBC
HCT: 30.4 % — ABNORMAL LOW (ref 39.0–52.0)
Hemoglobin: 9.9 g/dL — ABNORMAL LOW (ref 13.0–17.0)
MCH: 27 pg (ref 26.0–34.0)
MCHC: 32.6 g/dL (ref 30.0–36.0)
MCV: 82.8 fL (ref 80.0–100.0)
Platelets: 14 10*3/uL — CL (ref 150–400)
RBC: 3.67 MIL/uL — ABNORMAL LOW (ref 4.22–5.81)
RDW: 17.6 % — ABNORMAL HIGH (ref 11.5–15.5)
WBC: 1.5 10*3/uL — ABNORMAL LOW (ref 4.0–10.5)
nRBC: 0 % (ref 0.0–0.2)

## 2018-08-04 LAB — LACTATE DEHYDROGENASE: LDH: 521 U/L — ABNORMAL HIGH (ref 98–192)

## 2018-08-04 LAB — LEGIONELLA PNEUMOPHILA SEROGP 1 UR AG: L. pneumophila Serogp 1 Ur Ag: NEGATIVE

## 2018-08-04 LAB — PROCALCITONIN: Procalcitonin: 0.14 ng/mL

## 2018-08-04 MED ORDER — DEXAMETHASONE SODIUM PHOSPHATE 4 MG/ML IJ SOLN
4.0000 mg | INTRAMUSCULAR | Status: DC
  Administered 2018-08-04 – 2018-08-18 (×14): 4 mg via INTRAVENOUS
  Filled 2018-08-04 (×15): qty 1

## 2018-08-04 MED ORDER — SODIUM CHLORIDE 0.9 % IV SOLN
6.0000 mg | Freq: Once | INTRAVENOUS | Status: AC
  Administered 2018-08-04: 6 mg via INTRAVENOUS
  Filled 2018-08-04: qty 4

## 2018-08-04 NOTE — Progress Notes (Signed)
Aware of IR request for possible image-guided bone marrow biopsy.  Plan for procedure tentatively for 08/05/2018. Patient will be NPO at midnight. INR pending. Will consent in IR tomorrow prior to procedure. Darylene Price, RN aware.  Please call IR with questions/concerns.   Bea Graff Louk, PA-C 08/04/2018, 2:31 PM

## 2018-08-04 NOTE — Progress Notes (Addendum)
HEMATOLOGY-ONCOLOGY PROGRESS NOTE  SUBJECTIVE: Patient was readmitted due to abdominal pain and shortness of breath.  He states that he was more fatigued and weak at home.  He was spent a lot of time in his recliner chair and felt as though he could not get out of that chair.  He reports anorexia and has lost more weight.  He has had night sweats at home.  He denies having any fevers or chills.  Reports lower extremity edema which is new.  Reports shortness of breath and intermittent cough.  He continues to have pancytopenia.  Denies bleeding.  The patient was scheduled to have a PET scan on 08/11/2018 and then would be considered for bronchoscopy with biopsy.  REVIEW OF SYSTEMS:   Constitutional: Denies fevers, chills.  Reports progressive fatigue and weakness.  Reports night sweats. Eyes: Denies blurriness of vision Ears, nose, mouth, throat, and face: Denies mucositis or sore throat Respiratory: Ports cough and shortness of breath  cardiovascular: Denies palpitation, chest discomfort Gastrointestinal: Reports abdominal pain in the center of his abdomen and in the left upper quadrant.  Reports nausea but no vomiting Skin: Denies abnormal skin rashes Lymphatics: Denies new lymphadenopathy.  Bruises easily. Neurological:Denies numbness, tingling or new weaknesses Behavioral/Psych: Mood is stable, no new changes  Extremities: Reports lower extremity edema which is new since prior admission. All other systems were reviewed with the patient and are negative.  I have reviewed the past medical history, past surgical history, social history and family history with the patient and they are unchanged from previous note.   PHYSICAL EXAMINATION: ECOG PERFORMANCE STATUS: 1 - Symptomatic but completely ambulatory  Vitals:   08/04/18 0538 08/04/18 1002  BP: (!) 94/56 (!) 95/53  Pulse: 86 75  Resp: 18 18  Temp: 97.8 F (36.6 C) (!) 97.5 F (36.4 C)  SpO2: 100% 100%   Filed Weights   08/03/18 1737  08/03/18 2210  Weight: 166 lb 3.6 oz (75.4 kg) 168 lb 14 oz (76.6 kg)    GENERAL:alert, no distress and comfortable SKIN: skin color, texture, turgor are normal, no rashes or significant lesions EYES: normal, Conjunctiva are pink and non-injected, sclera clear OROPHARYNX:no exudate, no erythema and lips, buccal mucosa, and tongue normal  NECK: supple, thyroid normal size, non-tender, without nodularity LYMPH:  no palpable lymphadenopathy in the cervical, axillary or inguinal LUNGS: clear to auscultation and percussion with normal breathing effort HEART: regular rate & rhythm and no murmurs.  1+ bilateral lower extremity edema ABDOMEN: Normal bowel sounds, abdomen soft, diffuse tenderness with light palpation.  Splenomegaly noted. Musculoskeletal:no cyanosis of digits and no clubbing  NEURO: alert & oriented x 3 with fluent speech, no focal motor/sensory deficits  LABORATORY DATA:  I have reviewed the data as listed CMP Latest Ref Rng & Units 08/04/2018 08/03/2018 07/25/2018  Glucose 70 - 99 mg/dL 90 124(H) 93  BUN 8 - 23 mg/dL 59(H) 73(H) 17  Creatinine 0.61 - 1.24 mg/dL 1.68(H) 2.32(H) 1.13  Sodium 135 - 145 mmol/L 137 133(L) 139  Potassium 3.5 - 5.1 mmol/L 4.1 4.8 4.0  Chloride 98 - 111 mmol/L 106 101 106  CO2 22 - 32 mmol/L 18(L) 16(L) 23  Calcium 8.9 - 10.3 mg/dL 7.1(L) 7.8(L) 8.3(L)  Total Protein 6.5 - 8.1 g/dL 3.7(L) 5.1(L) -  Total Bilirubin 0.3 - 1.2 mg/dL 4.3(H) 5.9(H) -  Alkaline Phos 38 - 126 U/L 178(H) 233(H) -  AST 15 - 41 U/L 51(H) 66(H) -  ALT 0 - 44 U/L 20  24 -    Lab Results  Component Value Date   WBC 1.5 (L) 08/04/2018   HGB 9.9 (L) 08/04/2018   HCT 30.4 (L) 08/04/2018   MCV 82.8 08/04/2018   PLT 14 (LL) 08/04/2018   NEUTROABS 1.4 (L) 08/03/2018    ASSESSMENT AND PLAN: This is an 84 year old male with a 3 to 4-week history of abdominal pain who has pancytopenia and an enlarged spleen.  Imaging obtained during his last hospital admission is concerning for  lymphoma versus another malignancy.  Recommendation has been made to perform a biopsy of the spleen, but this cannot be done due to the risk of bleeding and because there are no focal splenic lesions.  CT scan of the chest was performed with contrast which shows a 14 mm pretracheal lymph node, 3 cm subcarinal lymph node, and 13 mm right pericardial node.  The patient was seen by pulmonology who recommended a PET scan and then would see the patient for consideration of EBUS with biopsy.  PET scan was scheduled for 08/11/2018 but the patient developed worsening abdominal discomfort, shortness of breath, fatigue, and weakness and has now been readmitted.  1.  Pancytopenia 2.  Splenomegaly 3.  Enlarged mediastinal lymph nodes 4.  Community-acquired pneumonia 5.  Acute renal failure 6.  Hypoalbuminemia  -Findings are concerning for lymphoma -Recommend bone marrow biopsy interventional radiology -We will consult general surgery for splenomegaly. Spoke with them by phone. No intervention recommended from their standpoint.  -Transfuse platelets only if actively bleeding -Antibiotics per hospitalist team for pneumonia  Mikey Bussing, DNP, AGPCNP-BC, AOCNP  Attending Note  I personally saw the patient, reviewed the chart and examined the patient. The plan of care was discussed with the patient his caregiver. I spent 45 minutes with the patient going over all of his health issues.  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 -Bone marrow biopsy to be done tomorrow by interventional radiology.  We would like the bone marrow to be sent for flow cytometry and cytogenetics. -Once we get confirmation of diagnosis then I will discuss treatment options with him.  Given his advanced age and multiple comorbidities, our treatment options could be limited. -Goals of care: Prolongation of life and improvement of symptoms. -DNR CC: Patient does not wish to be intubated or resuscitated if his heart were to  stop. -Hyperuricemia: Could be causing renal dysfunction: Tumor lysis syndrome: Rasburicase 6 mg IV today. -BCR-ABL and flow cytometry sent  -If we do not have a tissue diagnosis on the bone marrow biopsy, I would recommend that we consider splenectomy as a potential diagnostic/therapeutic strategy.  -Severe thrombocytopenia: Plan to start dexamethasone 4 mg daily.  This could help improve both lymphoma and thrombocytopenia.  I explained to the patient that his prognosis is poor if we do not identify the cause and start treatment very soon.

## 2018-08-04 NOTE — Progress Notes (Addendum)
PROGRESS NOTE  Zachary Bennett VZD:[id number] DOB: 12/31/1934 DOA: 08/03/2018 PCP: Nicholas Lose, MD   LOS: 0 days   Brief narrative:  Zachary Bennett is a 84 y.o. male with medical history significant of thrombocytopenia and hyperlipidemia; who presented with complaints of abdominal pain and worsening shortness of breath.  Patient had just been admitted to the hospital from 4/29-5/2 after being found to have splenomegaly with pancytopenia with imaging concerning for lymphoma.  Patient was evaluated by Dr. Lindi Adie and ultimately the plan had been for an outpatient PET scan for targeted biopsy of a hypermetabolic site.  However, patient reported that the PET scan was not scheduled until the 19th of this month.  Since being home, he reported that he had decreased appetite, unable to eat much solid food as he reports that tastes like leather and has a difficult time swallowing it.    He also complained of generalized weakness and ambulatory dysfunction with shortness of breath.   Upon admission into the emergency department, patient was noted to be afebrile, pulse 92-100, respirations 18-24, blood pressure 99/58-108/62, and O2 saturation maintained on room air.  Patient was placed on 2 L nasal cannula oxygen for comfort.  Labs revealed WBC 1.9, platelets 18, sodium 133, CO2 16, BUN 73, creatinine 2.32, calcium 7.8, alkaline phosphatase 233, albumin 2.5, lipase 68, AST 66 , and total bilirubin 5.9.  Chest x-ray showed right lower lobe infiltrate concerning for possible pneumonia.  Patient was given 1 L normal saline IV fluids, vancomycin, and cefepime.  Patient was then considered for admission to the hospital.  Subjective: Patient complains of mild abdominal pain, dyspnea and intermittent cough.. Denies any nausea or vomiting but has poor appetite.  Complaints of generalized weakness and fatigue.  Assessment/Plan:  Principal Problem:   ARF (acute renal failure) (HCC) Active Problems:   Thrombocytopenia  (HCC)   Elevated LFTs   Hyperbilirubinemia   Splenomegaly   CAP (community acquired pneumonia)   Dyspnea   Prolonged QT interval  Acute kidney injury likely secondary to volume depletion from poor oral intake.  We will continue with IV fluid hydration.  Check BMP in a.m.   Creatinine has slightly improved.  Dyspnea likely secondary to community-acquired pneumonia.  On Supplemental oxygen.  Will wean oxygen as able.  Chest x-ray showing signs of a ill-defined airspace opacity in the right medial lung base concerning for possible pneumonia.    Patient does have a large mediastinal lymphadenopathy as well.  Continue on vancomycin and cefepime given the recent hospitalization.  Continue supportive care.  Check sputum culture.  WBC is 1.5 and temperature is 97.5 F.  Blood cultures negative in less than 24 hours.  Legionella urine antigen negative.  COVID-19 was negative  Abdominal pain  secondary to splenomegaly: Patient noted to have previous CT scan showing severe splenomegaly pretracheal lymph nodes and subcarinal lymph enlargement from 4/29. Oncology on board and had recommended PET scan which is scheduled for 5/19, to evaluate most hypermetabolic area for biopsy.  We will continue oxycodone/fentanyl as needed for abdominal pain.  Consulting IR for bone marrow biopsy.  Pancytopenia: Acute on chronic.   Oncology has been consulted.  No active bleeding at this time.  Dr. Lindi Adie to follow the patient.  Oncology team recommends transfusing platelets only if bleeding.  Elevated bilirubin/elevated LFTs: Bilirubin level has slightly improved.. Patient previously noted to have cholelithiasis without concerning symptoms for acute cholecystitis during last hospitalization. May warrant reimaging if symptoms persist  Prolonged QT interval: On QTc503  on admission. Will try to avoid QT prolonging medication Repeat EKG today with sinus tachycardia.  Hyperlipidemia. Continue to hold statins.  Tobacco  abuse: 50-pack-year history.  Quit smoking 2 weeks back.  Emphasized to abstain from smoking.   VTE Prophylaxis: SCDs  Code Status: Full  Family Communication: I tried to reach Jomarie Longs listed as contact in the computer without any success.  Disposition Plan: Home likely in 2 to 3 days, once patient is clinically stable.  Await for IR guided bone marrow biopsy.  Continue iv antibiotics.  Follow cultures.  Will get physical therapy evaluation.  We will change the patient's status to inpatient at this time due to need for further IV antibiotics, oxygen supplementation, further evaluation and treatment.  Consultants:  Oncology  Interventional radiology  Procedures:  IR consulted for bone marrow biopsy   Antibiotics: Anti-infectives (From admission, onward)   Start     Dose/Rate Route Frequency Ordered Stop   08/05/18 1600  vancomycin (VANCOCIN) 1,250 mg in sodium chloride 0.9 % 250 mL IVPB     1,250 mg 166.7 mL/hr over 90 Minutes Intravenous Every 48 hours 08/03/18 1449     08/04/18 1400  ceFEPIme (MAXIPIME) 2 g in sodium chloride 0.9 % 100 mL IVPB     2 g 200 mL/hr over 30 Minutes Intravenous Every 24 hours 08/03/18 1541 08/12/18 1359   08/03/18 1430  ceFEPIme (MAXIPIME) 2 g in sodium chloride 0.9 % 100 mL IVPB     2 g 200 mL/hr over 30 Minutes Intravenous  Once 08/03/18 1420 08/03/18 1512   08/03/18 1430  vancomycin (VANCOCIN) 1,500 mg in sodium chloride 0.9 % 500 mL IVPB     1,500 mg 250 mL/hr over 120 Minutes Intravenous  Once 08/03/18 1421 08/03/18 1744      Objective: Vitals:   08/04/18 0538 08/04/18 1002  BP: (!) 94/56 (!) 95/53  Pulse: 86 75  Resp: 18 18  Temp: 97.8 F (36.6 C) (!) 97.5 F (36.4 C)  SpO2: 100% 100%    Intake/Output Summary (Last 24 hours) at 08/04/2018 1454 Last data filed at 08/04/2018 0843 Gross per 24 hour  Intake 4608.4 ml  Output 500 ml  Net 4108.4 ml   Filed Weights   08/03/18 1737 08/03/18 2210  Weight: 75.4 kg 76.6 kg    Body mass index is 25.68 kg/m.   Physical Exam: GENERAL: Patient is alert awake and oriented.  On nasal cannula oxygen. HENT: No scleral pallor or icterus. Pupils equally reactive to light. Oral mucosa is moist NECK: is supple, no palpable thyroid enlargement. CHEST: Clear to auscultation. No crackles or wheezes. Non tender on palpation. Diminished breath sounds bilaterally. CVS: S1 and S2 heard, no murmur. Regular rate and rhythm. No pericardial rub. ABDOMEN: Soft, bowel sounds are present.  Palpable spleen.  Diffuse tenderness of the abdomen. EXTREMITIES: Bilateral lower extremity edema. CNS: Cranial nerves are intact. No focal motor or sensory deficits. SKIN: warm and dry without rashes.  Data Review: I have personally reviewed the following laboratory data and studies,  CBC: Recent Labs  Lab 08/03/18 1302 08/04/18 0333  WBC 1.9* 1.5*  NEUTROABS 1.4*  --   HGB 13.0 9.9*  HCT 40.7 30.4*  MCV 82.9 82.8  PLT 18* 14*   Basic Metabolic Panel: Recent Labs  Lab 08/03/18 1302 08/04/18 0333  NA 133* 137  K 4.8 4.1  CL 101 106  CO2 16* 18*  GLUCOSE 124* 90  BUN 73* 59*  CREATININE 2.32* 1.68*  CALCIUM 7.8* 7.1*   Liver Function Tests: Recent Labs  Lab 08/03/18 1302 08/04/18 0333  AST 66* 51*  ALT 24 20  ALKPHOS 233* 178*  BILITOT 5.9* 4.3*  PROT 5.1* 3.7*  ALBUMIN 2.5* 1.9*   Recent Labs  Lab 08/03/18 1302  LIPASE 68*   No results for input(s): AMMONIA in the last 168 hours. Cardiac Enzymes: Recent Labs  Lab 08/03/18 1302  TROPONINI <0.03   BNP (last 3 results) Recent Labs    08/03/18 1303  BNP 73.0    ProBNP (last 3 results) No results for input(s): PROBNP in the last 8760 hours.  CBG: No results for input(s): GLUCAP in the last 168 hours. Recent Results (from the past 240 hour(s))  SARS Coronavirus 2 (CEPHEID- Performed in [facility] [place] hospital lab), Hosp Order     Status: None   Collection Time: 08/03/18  2:45 PM  Result Value Ref Range  Status   SARS Coronavirus 2 NEGATIVE NEGATIVE Final    Comment: (NOTE) If result is NEGATIVE SARS-CoV-2 target nucleic acids are NOT DETECTED. The SARS-CoV-2 RNA is generally detectable in upper and lower  respiratory specimens during the acute phase of infection. The lowest  concentration of SARS-CoV-2 viral copies this assay can detect is 250  copies / mL. A negative result does not preclude SARS-CoV-2 infection  and should not be used as the sole basis for treatment or other  patient management decisions.  A negative result may occur with  improper specimen collection / handling, submission of specimen other  than nasopharyngeal swab, presence of viral mutation(s) within the  areas targeted by this assay, and inadequate number of viral copies  (<250 copies / mL). A negative result must be combined with clinical  observations, patient history, and epidemiological information. If result is POSITIVE SARS-CoV-2 target nucleic acids are DETECTED. The SARS-CoV-2 RNA is generally detectable in upper and lower  respiratory specimens dur ing the acute phase of infection.  Positive  results are indicative of active infection with SARS-CoV-2.  Clinical  correlation with patient history and other diagnostic information is  necessary to determine patient infection status.  Positive results do  not rule out bacterial infection or co-infection with other viruses. If result is PRESUMPTIVE POSTIVE SARS-CoV-2 nucleic acids MAY BE PRESENT.   A presumptive positive result was obtained on the submitted specimen  and confirmed on repeat testing.  While 2019 novel coronavirus  (SARS-CoV-2) nucleic acids may be present in the submitted sample  additional confirmatory testing may be necessary for epidemiological  and / or clinical management purposes  to differentiate between  SARS-CoV-2 and other Sarbecovirus currently known to infect humans.  If clinically indicated additional testing with an alternate  test  methodology [phone]) is advised. The SARS-CoV-2 RNA is generally  detectable in upper and lower respiratory sp ecimens during the acute  phase of infection. The expected result is Negative. Fact Sheet for Patients:  StrictlyIdeas.no Fact Sheet for Healthcare Providers: BankingDealers.co.za This test is not yet approved or cleared by the [place] FDA and has been authorized for detection and/or diagnosis of SARS-CoV-2 by FDA under an Emergency Use Authorization (EUA).  This EUA will remain in effect (meaning this test can be used) for the duration of the COVID-19 declaration under Section 564(b)(1) of the Act, 21 U.S.C. section 360bbb-3(b)(1), unless the authorization is terminated or revoked sooner. Performed at [facility], [place] [address] [place], [place] [address]   Culture, blood (routine x 2) Call  MD if unable to obtain prior to antibiotics being given     Status: None (Preliminary result)   Collection Time: 08/03/18  6:33 PM  Result Value Ref Range Status   Specimen Description BLOOD BLOOD LEFT FOREARM  Final   Special Requests   Final    BOTTLES DRAWN AEROBIC ONLY Blood Culture adequate volume   Culture   Final    NO GROWTH < 24 HOURS Performed at [facility], [address] [place], [place] [address]    Report Status PENDING  Incomplete  Culture, blood (routine x 2) Call MD if unable to obtain prior to antibiotics being given     Status: None (Preliminary result)   Collection Time: 08/03/18  6:33 PM  Result Value Ref Range Status   Specimen Description BLOOD LEFT ANTECUBITAL  Final   Special Requests   Final    BOTTLES DRAWN AEROBIC ONLY Blood Culture adequate volume   Culture   Final    NO GROWTH < 24 HOURS Performed at [facility], [address] [place], [place] [address]    Report Status PENDING  Incomplete     Studies: Dg Chest 1 View  Result Date: 08/03/2018 CLINICAL DATA:   Shortness of breath and chest pain EXAM: CHEST  1 VIEW COMPARISON:  Chest radiograph July 10, 2007 and chest CT July 23, 2018 FINDINGS: There is ill-defined airspace consolidation in the medial right base. Lung bases otherwise are clear. Heart size and pulmonary vascular normal. No adenopathy. There is aortic atherosclerosis. No bone lesions IMPRESSION: Ill-defined airspace opacity medial right base, concerning for pneumonia. Lungs elsewhere clear. Heart size normal. No adenopathy. Aortic Atherosclerosis (ICD10-I70.0). Electronically Signed   By: Lowella Grip III M.D.   On: 08/03/2018 13:48    Scheduled Meds: . guaiFENesin  600 mg Oral BID  . zolpidem  5 mg Oral QHS    Continuous Infusions: . sodium chloride 100 mL/hr at 08/04/18 0630  . ceFEPime (MAXIPIME) IV 2 g (08/04/18 1339)  . [START ON 08/05/2018] vancomycin       Flora Lipps, MD  [facility] 08/04/2018

## 2018-08-05 ENCOUNTER — Inpatient Hospital Stay (HOSPITAL_COMMUNITY): Payer: Medicare Other

## 2018-08-05 LAB — CBC
HCT: 32.9 % — ABNORMAL LOW (ref 39.0–52.0)
Hemoglobin: 10.5 g/dL — ABNORMAL LOW (ref 13.0–17.0)
MCH: 27.5 pg (ref 26.0–34.0)
MCHC: 31.9 g/dL (ref 30.0–36.0)
MCV: 86.1 fL (ref 80.0–100.0)
Platelets: 12 10*3/uL — CL (ref 150–400)
RBC: 3.82 MIL/uL — ABNORMAL LOW (ref 4.22–5.81)
RDW: 18.1 % — ABNORMAL HIGH (ref 11.5–15.5)
WBC: 1.2 10*3/uL — CL (ref 4.0–10.5)
nRBC: 0 % (ref 0.0–0.2)

## 2018-08-05 LAB — MRSA PCR SCREENING: MRSA by PCR: NEGATIVE

## 2018-08-05 LAB — PROCALCITONIN: Procalcitonin: 0.12 ng/mL

## 2018-08-05 MED ORDER — FENTANYL CITRATE (PF) 100 MCG/2ML IJ SOLN
INTRAMUSCULAR | Status: AC
  Filled 2018-08-05: qty 2

## 2018-08-05 MED ORDER — SODIUM CHLORIDE 0.9 % IV SOLN
2.0000 g | Freq: Two times a day (BID) | INTRAVENOUS | Status: DC
  Administered 2018-08-05 – 2018-08-08 (×6): 2 g via INTRAVENOUS
  Filled 2018-08-05 (×7): qty 2

## 2018-08-05 MED ORDER — LIDOCAINE HCL 1 % IJ SOLN
INTRAMUSCULAR | Status: AC
  Filled 2018-08-05: qty 20

## 2018-08-05 MED ORDER — MIDAZOLAM HCL 2 MG/2ML IJ SOLN
INTRAMUSCULAR | Status: AC | PRN
  Administered 2018-08-05: 1 mg via INTRAVENOUS

## 2018-08-05 MED ORDER — FENTANYL CITRATE (PF) 100 MCG/2ML IJ SOLN
INTRAMUSCULAR | Status: AC | PRN
  Administered 2018-08-05: 25 ug via INTRAVENOUS

## 2018-08-05 MED ORDER — MIDAZOLAM HCL 2 MG/2ML IJ SOLN
INTRAMUSCULAR | Status: AC
  Filled 2018-08-05: qty 2

## 2018-08-05 MED ORDER — SODIUM CHLORIDE 0.9% FLUSH: 10.0000 mL | INTRAVENOUS | Status: DC | PRN

## 2018-08-05 MED ORDER — LIDOCAINE-EPINEPHRINE 1 %-1:100000 IJ SOLN
INTRAMUSCULAR | Status: AC
  Filled 2018-08-05: qty 1

## 2018-08-05 NOTE — Progress Notes (Signed)
PROGRESS NOTE  Zachary Bennett:[id number] DOB: 11-28-1934 DOA: 08/03/2018 PCP: Nicholas Lose, MD   LOS: 1 day   Brief narrative:  Zachary Bennett is a 84 y.o. male with medical history significant of thrombocytopenia and hyperlipidemia; who presented with complaints of abdominal pain and worsening shortness of breath.  Patient had just been admitted to the hospital from 4/29-5/2 after being found to have splenomegaly with pancytopenia with imaging concerning for lymphoma.  Patient was evaluated by Dr. Lindi Adie and ultimately the plan had been for an outpatient PET scan for targeted biopsy of a hypermetabolic site.  However, patient reported that the PET scan was not scheduled until the 19th of this month.  Since being home, he reported that he had decreased appetite, unable to eat much solid food as he reports that tastes like leather and has a difficult time swallowing it.    He also complained of generalized weakness and ambulatory dysfunction with shortness of breath.   Upon admission into the emergency department, patient was noted to be afebrile, pulse 92-100, respirations 18-24, blood pressure 99/58-108/62, and O2 saturation maintained on room air.  Patient was placed on 2 L nasal cannula oxygen for comfort.  Labs revealed WBC 1.9, platelets 18, sodium 133, CO2 16, BUN 73, creatinine 2.32, calcium 7.8, alkaline phosphatase 233, albumin 2.5, lipase 68, AST 66 , and total bilirubin 5.9.  Chest x-ray showed right lower lobe infiltrate concerning for possible pneumonia.  Patient was given 1 L normal saline IV fluids, vancomycin, and cefepime.  Patient was then considered for admission to the hospital.  Subjective: Patient seen resting comfortably in bed, just returned from bone marrow biopsy.  Continues with generalized abdominal discomfort and distention.  No other complaints at this time.  Denies headache, no fever/chills/night sweats, no nausea/vomiting/diarrhea, no chest pain, no palpitations, no  shortness of breath, no paresthesias.  No acute events overnight per nursing staff.  Assessment/Plan:  Principal Problem:   ARF (acute renal failure) (HCC) Active Problems:   Thrombocytopenia (HCC)   Elevated LFTs   Hyperbilirubinemia   Splenomegaly   CAP (community acquired pneumonia)   Dyspnea   Prolonged QT interval  Acute kidney injury  Etiology likely secondary to volume depletion with poor oral intake prior to hospitalization. likely secondary to volume depletion from poor oral intake.  Also consideration of hyperuricemia. Creatinine on admission 2.32. --continue with IV fluid hydration.   --Creatinine improving 2.32-->1.68 (baseline 1.25 06/2018) --Avoid nephrotoxins, renally dose all medications --daily BMP  Dyspnea likely secondary to community-acquired pneumonia.   On Supplemental oxygen.  Will wean oxygen as able.  Chest x-ray showing signs of a ill-defined airspace opacity in the right medial lung base concerning for possible pneumonia.    Patient does have a large mediastinal lymphadenopathy as well.  Afebrile admission with a white blood cell count of 1.5. --COVID-19 negative --Urine Legionella antigen negative --MRSA PCR negative, discontinue vancomycin today  --Continue cefepime given the recent hospitalization. --Sputum culture: Pending --Blood cultures no growth x24 hours --Titrate supplemental oxygen to maintain SPO2 greater than 92% --Incentive spirometry --Continue supportive care.   Hyperuricemia Uric acid level 10.9 on admission. --s/p rasburicase 6 mg IV on 08/04/2018  Abdominal pain,  secondary to splenomegaly:  Patient noted to have previous CT scan showing severe splenomegaly pretracheal lymph nodes and subcarinal lymph enlargement from 4/29. Oncology on board and had recommended PET scan which is scheduled for 5/19, to evaluate most hypermetabolic area for biopsy.   --Concern for lymphoma, status post IR  bone marrow biopsy today --Follow-up  pathology --Oncology consulted general surgery for consideration of splenectomy --continue oxycodone/fentanyl as needed for abdominal pain.   Pancytopenia; Acute on chronic Oncology, Dr. Lindi Adie following, appreciate assistance.  No active bleeding at this time.   --Platelet count down to 14 --Concern for lymphoma, especially with splenomegaly --General surgery consulted for possible splenectomy by medical oncology --Continue dexamethasone 4 mg daily --Oncology team recommends transfusing platelets only if bleeding.  Elevated bilirubin/elevated LFTs:  Bilirubin level has slightly improved.. Patient previously noted to have cholelithiasis without concerning symptoms for acute cholecystitis during last hospitalization. May warrant reimaging if symptoms persist  Prolonged QT interval:  QTc503 on admission.  --QT prolonging medications  Hyperlipidemia. Continue to hold statins.  Tobacco abuse:  50-pack-year history.  Quit smoking 2 weeks back.  Emphasized to abstain from smoking.   VTE Prophylaxis: SCDs  Code Status: Full  Family Communication: none  Disposition Plan: Home likely in 2 to 3 days, once patient is clinically stable. Continue iv antibiotics.  Follow cultures.  PT evaluation.  Consultants:  Oncology  Interventional radiology  General surgery  Procedures:  IR bone marrow biopsy 08/05/2018   Antibiotics: Anti-infectives (From admission, onward)   Start     Dose/Rate Route Frequency Ordered Stop   08/05/18 1600  vancomycin (VANCOCIN) 1,250 mg in sodium chloride 0.9 % 250 mL IVPB  Status:  Discontinued     1,250 mg 166.7 mL/hr over 90 Minutes Intravenous Every 48 hours 08/03/18 1449 08/05/18 1248   08/05/18 1000  ceFEPIme (MAXIPIME) 2 g in sodium chloride 0.9 % 100 mL IVPB     2 g 200 mL/hr over 30 Minutes Intravenous Every 12 hours 08/05/18 0852     08/04/18 1400  ceFEPIme (MAXIPIME) 2 g in sodium chloride 0.9 % 100 mL IVPB  Status:  Discontinued     2  g 200 mL/hr over 30 Minutes Intravenous Every 24 hours 08/03/18 1541 08/05/18 0852   08/03/18 1430  ceFEPIme (MAXIPIME) 2 g in sodium chloride 0.9 % 100 mL IVPB     2 g 200 mL/hr over 30 Minutes Intravenous  Once 08/03/18 1420 08/03/18 1512   08/03/18 1430  vancomycin (VANCOCIN) 1,500 mg in sodium chloride 0.9 % 500 mL IVPB     1,500 mg 250 mL/hr over 120 Minutes Intravenous  Once 08/03/18 1421 08/03/18 1744      Objective: Vitals:   08/05/18 0958 08/05/18 1008  BP: 102/61 (!) 93/51  Pulse: 89 77  Resp: 16 18  Temp: 98 F (36.7 C)   SpO2: 100% 99%    Intake/Output Summary (Last 24 hours) at 08/05/2018 1639 Last data filed at 08/05/2018 0817 Gross per 24 hour  Intake 1372.98 ml  Output 625 ml  Net 747.98 ml   Filed Weights   08/03/18 1737 08/03/18 2210 08/04/18 2125  Weight: 75.4 kg 76.6 kg 76.6 kg   Body mass index is 25.68 kg/m.   Physical Exam: GENERAL: Patient is alert awake and oriented.  On nasal cannula oxygen. HENT: No scleral pallor or icterus. Pupils equally reactive to light. Oral mucosa is moist NECK: is supple, no palpable thyroid enlargement. CHEST: Clear to auscultation. No crackles or wheezes. Non tender on palpation. Diminished breath sounds bilaterally. CVS: S1 and S2 heard, no murmur. Regular rate and rhythm. No pericardial rub. ABDOMEN: Soft, bowel sounds are present.  Palpable spleen.  Diffuse tenderness of the abdomen. EXTREMITIES: Bilateral lower extremity edema. CNS: Cranial nerves are intact. No focal motor or sensory  deficits. SKIN: warm and dry without rashes.  Data Review: I have personally reviewed the following laboratory data and studies,  CBC: Recent Labs  Lab 08/03/18 1302 08/04/18 0333 08/05/18 0615  WBC 1.9* 1.5* 1.2*  NEUTROABS 1.4*  --   --   HGB 13.0 9.9* 10.5*  HCT 40.7 30.4* 32.9*  MCV 82.9 82.8 86.1  PLT 18* 14* 12*   Basic Metabolic Panel: Recent Labs  Lab 08/03/18 1302 08/04/18 0333  NA 133* 137  K 4.8 4.1   CL 101 106  CO2 16* 18*  GLUCOSE 124* 90  BUN 73* 59*  CREATININE 2.32* 1.68*  CALCIUM 7.8* 7.1*   Liver Function Tests: Recent Labs  Lab 08/03/18 1302 08/04/18 0333  AST 66* 51*  ALT 24 20  ALKPHOS 233* 178*  BILITOT 5.9* 4.3*  PROT 5.1* 3.7*  ALBUMIN 2.5* 1.9*   Recent Labs  Lab 08/03/18 1302  LIPASE 68*   No results for input(s): AMMONIA in the last 168 hours. Cardiac Enzymes: Recent Labs  Lab 08/03/18 1302  TROPONINI <0.03   BNP (last 3 results) Recent Labs    08/03/18 1303  BNP 73.0    ProBNP (last 3 results) No results for input(s): PROBNP in the last 8760 hours.  CBG: No results for input(s): GLUCAP in the last 168 hours. Recent Results (from the past 240 hour(s))  SARS Coronavirus 2 (CEPHEID- Performed in [place] hospital lab), Hosp Order     Status: None   Collection Time: 08/03/18  2:45 PM  Result Value Ref Range Status   SARS Coronavirus 2 NEGATIVE NEGATIVE Final    Comment: (NOTE) If result is NEGATIVE SARS-CoV-2 target nucleic acids are NOT DETECTED. The SARS-CoV-2 RNA is generally detectable in upper and lower  respiratory specimens during the acute phase of infection. The lowest  concentration of SARS-CoV-2 viral copies this assay can detect is 250  copies / mL. A negative result does not preclude SARS-CoV-2 infection  and should not be used as the sole basis for treatment or other  patient management decisions.  A negative result may occur with  improper specimen collection / handling, submission of specimen other  than nasopharyngeal swab, presence of viral mutation(s) within the  areas targeted by this assay, and inadequate number of viral copies  (<250 copies / mL). A negative result must be combined with clinical  observations, patient history, and epidemiological information. If result is POSITIVE SARS-CoV-2 target nucleic acids are DETECTED. The SARS-CoV-2 RNA is generally detectable in upper and lower  respiratory specimens  dur ing the acute phase of infection.  Positive  results are indicative of active infection with SARS-CoV-2.  Clinical  correlation with patient history and other diagnostic information is  necessary to determine patient infection status.  Positive results do  not rule out bacterial infection or co-infection with other viruses. If result is PRESUMPTIVE POSTIVE SARS-CoV-2 nucleic acids MAY BE PRESENT.   A presumptive positive result was obtained on the submitted specimen  and confirmed on repeat testing.  While 2019 novel coronavirus  (SARS-CoV-2) nucleic acids may be present in the submitted sample  additional confirmatory testing may be necessary for epidemiological  and / or clinical management purposes  to differentiate between  SARS-CoV-2 and other Sarbecovirus currently known to infect humans.  If clinically indicated additional testing with an alternate test  methodology [phone]) is advised. The SARS-CoV-2 RNA is generally  detectable in upper and lower respiratory sp ecimens during the acute  phase of  infection. The expected result is Negative. Fact Sheet for Patients:  StrictlyIdeas.no Fact Sheet for Healthcare Providers: BankingDealers.co.za This test is not yet approved or cleared by the [place] FDA and has been authorized for detection and/or diagnosis of SARS-CoV-2 by FDA under an Emergency Use Authorization (EUA).  This EUA will remain in effect (meaning this test can be used) for the duration of the COVID-19 declaration under Section 564(b)(1) of the Act, 21 U.S.C. section 360bbb-3(b)(1), unless the authorization is terminated or revoked sooner. Performed at [facility], [place] [address] [place], [place] [address]   Culture, blood (routine x 2) Call MD if unable to obtain prior to antibiotics being given     Status: None (Preliminary result)   Collection Time: 08/03/18  6:33 PM  Result Value Ref Range Status    Specimen Description BLOOD BLOOD LEFT FOREARM  Final   Special Requests   Final    BOTTLES DRAWN AEROBIC ONLY Blood Culture adequate volume   Culture   Final    NO GROWTH 2 DAYS Performed at [facility], [place] [address] [place], [place] [address]    Report Status PENDING  Incomplete  Culture, blood (routine x 2) Call MD if unable to obtain prior to antibiotics being given     Status: None (Preliminary result)   Collection Time: 08/03/18  6:33 PM  Result Value Ref Range Status   Specimen Description BLOOD LEFT ANTECUBITAL  Final   Special Requests   Final    BOTTLES DRAWN AEROBIC ONLY Blood Culture adequate volume   Culture   Final    NO GROWTH 2 DAYS Performed at [facility], [address] [place], [place] [address]    Report Status PENDING  Incomplete  MRSA PCR Screening     Status: None   Collection Time: 08/05/18 10:19 AM  Result Value Ref Range Status   MRSA by PCR NEGATIVE NEGATIVE Final    Comment:        The GeneXpert MRSA Assay (FDA approved for NASAL specimens only), is one component of a comprehensive MRSA colonization surveillance program. It is not intended to diagnose MRSA infection nor to guide or monitor treatment for MRSA infections. Performed at [facility], [place] [address], [place], [place] [address]      Studies: Ct Bone Marrow Biopsy & Aspiration  Result Date: 08/05/2018 INDICATION: Pancytopenia and splenomegaly. Please perform CT-guided biopsy for tissue diagnostic purposes. EXAM: CT-GUIDED BONE MARROW BIOPSY AND ASPIRATION MEDICATIONS: None ANESTHESIA/SEDATION: Fentanyl 25 mcg IV; Versed 1 mg IV Sedation Time: 11 Minutes; The patient was continuously monitored during the procedure by the interventional radiology nurse under my direct supervision. COMPLICATIONS: None immediate. PROCEDURE: Informed consent was obtained from the patient following an explanation of the procedure, risks, benefits and alternatives. The patient  understands, agrees and consents for the procedure. All questions were addressed. A time out was performed prior to the initiation of the procedure. The patient was positioned prone and non-contrast localization CT was performed of the pelvis to demonstrate the iliac marrow spaces. The operative site was prepped and draped in the usual sterile fashion. Under sterile conditions and local anesthesia, a 22 gauge spinal needle was utilized for procedural planning. Next, an 11 gauge coaxial bone biopsy needle was advanced into the left iliac marrow space. Needle position was confirmed with CT imaging. Initially, bone marrow aspiration was performed. Next, a bone marrow biopsy was obtained with the 11 gauge outer bone marrow  device. Samples were prepared with the cytotechnologist and deemed adequate. The needle was removed intact. Hemostasis was obtained with compression and a dressing was placed. The patient tolerated the procedure well without immediate post procedural complication. IMPRESSION: Successful CT guided left iliac bone marrow aspiration and core biopsy. Electronically Signed   By: Sandi Mariscal M.D.   On: 08/05/2018 11:46    Scheduled Meds:  dexamethasone  4 mg Intravenous Q24H   fentaNYL       guaiFENesin  600 mg Oral BID   lidocaine       lidocaine-EPINEPHrine       midazolam       zolpidem  5 mg Oral QHS    Continuous Infusions:  sodium chloride 100 mL/hr at 08/05/18 1628   ceFEPime (MAXIPIME) IV 2 g (08/05/18 1002)     Byren Pankow J [place] DO  [facility] 08/05/2018

## 2018-08-05 NOTE — Consult Note (Signed)
Chief Complaint: Patient was seen in consultation today for pancytopenia/bone marrow biopsy.  Referring Physician(s): Curico, Roselie Awkward  Supervising Physician: Sandi Mariscal  Patient Status: [facility] ([facility]) - In-pt  History of Present Illness: Zachary Bennett is a 84 y.o. male with a past medical history of high cholesterol, thrombocytopenia, and abdominal hernia. He presented to [facility] ED 07/22/2018 with complaint of abdominal pain. In ED, CT abdomen/pelvis revealed splenomegaly along with enlarged adenopathy of the porta hepatis and periaortic regions, concerning for malignancy. He was admitted for further management. During work-up, labs revealed pancytopenia. He was discharged 07/25/2018 with plans for outpatient PET/work-up. However, before outpatient workup occurred, he re-presented to [facility] ED 08/03/2018 again with complaint of abdominal pain. He was admitted for further management. Oncology was consulted who recommended IR consultation for possible bone marrow biopsy/aspiration.  CT abdomen/pelvis 07/22/2018: 1. Minimal cholelithiasis is noted with gallbladder wall thickening. Possible cholecystitis cannot be excluded. HIDA scan may be performed for further evaluation. 2. Severe splenomegaly is noted with significantly enlarged adenopathy in the porta hepatis and periaortic regions. This is concerning for lymphoma or other malignancy and clinical correlation is recommended. 3. Stable severe enlargement of prostate gland is noted. 4. Mild bilateral inguinal hernias are noted which contain loops of small bowel, but does not result in incarceration or obstruction. 5. 2.4 cm fusiform right common iliac artery aneurysm. 6. Aortic Atherosclerosis (ICD10-I70.0).  IR requested by Mikey Bussing, NP for possible image-guided bone marrow biopsy/aspiration. Patient awake and alert laying in bed. Complains of abdominal pain, stable since admission. Denies fever, chills, chest pain, dyspnea, or headache.   Past  Medical History:  Diagnosis Date   Abdominal hernia    High cholesterol    Thrombocytopenia ([place])     History reviewed. No pertinent surgical history.  Allergies: Patient has no known allergies.  Medications: Prior to Admission medications   Medication Sig Start Date End Date Taking? Authorizing Provider  pravastatin (PRAVACHOL) 40 MG tablet Take 40 mg by mouth daily.  04/06/18  Yes [provider]     Family History  Family history unknown: Yes    Social History   Socioeconomic History   Marital status: Married    Spouse name: Not on file   Number of children: Not on file   Years of education: Not on file   Highest education level: Not on file  Occupational History   Not on file  Social Needs   Financial resource strain: Not on file   Food insecurity:    Worry: Not on file    Inability: Not on file   Transportation needs:    Medical: Not on file    Non-medical: Not on file  Tobacco Use   Smoking status: Former Smoker    Types: Cigars, Cigarettes    Last attempt to quit: 07/20/2018    Years since quitting: 0.0   Smokeless tobacco: Never Used  Substance and Sexual Activity   Alcohol use: Not Currently   Drug use: Not Currently   Sexual activity: Not on file  Lifestyle   Physical activity:    Days per week: Not on file    Minutes per session: Not on file   Stress: Not on file  Relationships   Social connections:    Talks on phone: Not on file    Gets together: Not on file    Attends religious service: Not on file    Active member of club or organization: Not on file    Attends meetings  of clubs or organizations: Not on file    Relationship status: Not on file  Other Topics Concern   Not on file  Social History Narrative   Not on file     Review of Systems: A 12 point ROS discussed and pertinent positives are indicated in the HPI above.  All other systems are negative.  Review of Systems  Constitutional: Negative for  chills and fever.  Respiratory: Negative for shortness of breath and wheezing.   Cardiovascular: Negative for chest pain and palpitations.  Gastrointestinal: Positive for abdominal pain.  Neurological: Negative for headaches.  Psychiatric/Behavioral: Negative for behavioral problems and confusion.    Vital Signs: BP 126/64 (BP Location: Left Arm)    Pulse 83    Temp 98.4 F (36.9 C)    Resp 20    Ht _0  (1.727 m)    Wt 168 lb 14 oz (76.6 kg)    SpO2 100%    BMI 25.68 kg/m   Physical Exam Vitals signs and nursing note reviewed.  Constitutional:      General: He is not in acute distress.    Appearance: Normal appearance.  Cardiovascular:     Rate and Rhythm: Normal rate and regular rhythm.     Heart sounds: Normal heart sounds. No murmur.  Pulmonary:     Effort: Pulmonary effort is normal. No respiratory distress.     Breath sounds: Normal breath sounds. No wheezing.  Skin:    General: Skin is warm and dry.  Neurological:     Mental Status: He is alert and oriented to person, place, and time.  Psychiatric:        Mood and Affect: Mood normal.        Behavior: Behavior normal.        Thought Content: Thought content normal.        Judgment: Judgment normal.      MD Evaluation Airway: WNL Heart: WNL Abdomen: WNL Chest/ Lungs: WNL ASA  Classification: 3 Mallampati/Airway Score: Two   Imaging: Dg Chest 1 View  Result Date: 08/03/2018 CLINICAL DATA:  Shortness of breath and chest pain EXAM: CHEST  1 VIEW COMPARISON:  Chest radiograph July 10, 2007 and chest CT July 23, 2018 FINDINGS: There is ill-defined airspace consolidation in the medial right base. Lung bases otherwise are clear. Heart size and pulmonary vascular normal. No adenopathy. There is aortic atherosclerosis. No bone lesions IMPRESSION: Ill-defined airspace opacity medial right base, concerning for pneumonia. Lungs elsewhere clear. Heart size normal. No adenopathy. Aortic Atherosclerosis (ICD10-I70.0).  Electronically Signed   By: Lowella Grip III M.D.   On: 08/03/2018 13:48   Ct Chest W Contrast  Addendum Date: 07/24/2018   ADDENDUM REPORT: 07/24/2018 09:27 ADDENDUM: 13 mm right pericardial node also present. Electronically Signed   By: Nelson Chimes M.D.   On: 07/24/2018 09:27   Result Date: 07/24/2018 CLINICAL DATA:  Assess for thoracic malignancy. Abdominal pain. Splenomegaly and abdominal adenopathy. EXAM: CT CHEST WITH CONTRAST TECHNIQUE: Multidetector CT imaging of the chest was performed during intravenous contrast administration. CONTRAST:  45m OMNIPAQUE IOHEXOL 300 MG/ML  SOLN COMPARISON:  CT abdomen done yesterday. FINDINGS: Cardiovascular: Heart size is normal. The patient has coronary artery calcification. No pericardial effusion. Pulmonary arteries appear normal size. There is atherosclerosis of the aorta but no aneurysm or dissection. Mediastinum/Nodes: Pretracheal lymph node axial image 51 measuring 14 mm in diameter. Subcarinal adenopathy axial image 79 measuring 3 cm in diameter. No sign of hilar nodes. No axillary  adenopathy. Lungs/Pleura: Background centrilobular emphysema, upper lobe predominant. No pulmonary mass. Small pleural effusion on the right layering dependently with dependent atelectasis. Upper Abdomen: See results of abdominal scan yesterday. No interval change. Musculoskeletal: Negative IMPRESSION: Aortic atherosclerosis and coronary artery calcification. 14 mm pretracheal lymph node. 3 cm subcarinal lymph node. These could relate to the same pathologic process resulting in the abdominal adenopathy. There is no evidence of a primary neoplasm in the chest. Small right effusion layering dependently with mild right dependent atelectasis. Electronically Signed: By: Nelson Chimes M.D. On: 07/23/2018 21:40   Ct Abdomen Pelvis W Contrast  Addendum Date: 07/22/2018   ADDENDUM REPORT: 07/22/2018 16:10 ADDENDUM: The liver does not appear to be significantly abnormal, but given the  presence of severe splenomegaly and porta hepatis adenopathy, severe hepatocellular disease or early hepatic cirrhosis cannot be excluded. Correlation with liver function tests is recommended. Electronically Signed   By: Marijo Conception M.D.   On: 07/22/2018 16:10   Result Date: 07/22/2018 CLINICAL DATA:  Generalized abdominal pain. EXAM: CT ABDOMEN AND PELVIS WITH CONTRAST TECHNIQUE: Multidetector CT imaging of the abdomen and pelvis was performed using the standard protocol following bolus administration of intravenous contrast. CONTRAST:  124m OMNIPAQUE IOHEXOL 300 MG/ML  SOLN COMPARISON:  CT scan of December 30, 2008. FINDINGS: Lower chest: Minimal right pleural effusion is noted with adjacent subsegmental atelectasis. Hepatobiliary: Minimal cholelithiasis is noted with gallbladder wall thickening. No biliary dilatation is noted. The liver is unremarkable. Pancreas: Unremarkable. No pancreatic ductal dilatation or surrounding inflammatory changes. Spleen: There is interval development of severe splenomegaly without focal abnormality. Adrenals/Urinary Tract: Adrenal glands are unremarkable. Kidneys are normal, without renal calculi, focal lesion, or hydronephrosis. Bladder is unremarkable. Stomach/Bowel: The stomach appears normal. There is no evidence of bowel obstruction or inflammation. The appendix is not clearly visualized. Vascular/Lymphatic: Atherosclerosis of abdominal aorta is noted. 2.4 cm fusiform right common iliac artery aneurysm is noted. Adenopathy is noted in the porta hepatis region as well as in the periaortic region. Porta hepatis adenopathy measures 4.7 x 2.6 cm. 12 mm right periaortic lymph node is noted. Reproductive: Stable severe enlargement of prostate gland is noted. Other: Bilateral inguinal hernias are noted which contain loops of small bowel, but does not result in obstruction or incarceration. Musculoskeletal: No acute or significant osseous findings. IMPRESSION: Minimal  cholelithiasis is noted with gallbladder wall thickening. Possible cholecystitis cannot be excluded. HIDA scan may be performed for further evaluation. Severe splenomegaly is noted with significantly enlarged adenopathy in the porta hepatis and periaortic regions. This is concerning for lymphoma or other malignancy and clinical correlation is recommended. Stable severe enlargement of prostate gland is noted. Mild bilateral inguinal hernias are noted which contain loops of small bowel, but does not result in incarceration or obstruction. 2.4 cm fusiform right common iliac artery aneurysm. Aortic Atherosclerosis (ICD10-I70.0). Electronically Signed: By: JMarijo ConceptionM.D. On: 07/22/2018 15:51   [place]Abdomen Limited  Result Date: 07/22/2018 CLINICAL DATA:  Elevated liver function tests. EXAM: ULTRASOUND ABDOMEN LIMITED RIGHT UPPER QUADRANT COMPARISON:  None. FINDINGS: Gallbladder: Multiple small less than 1 cm gallstones are seen. The gallbladder is nondilated. Diffuse gallbladder wall thickening is seen measuring up to 6 mm. No evidence of pericholecystic fluid. No sonographic Murphy sign noted by sonographer. Common bile duct: Diameter: 4 mm, within normal limits. Liver: No focal lesion identified. Within normal limits in parenchymal echogenicity. Portal vein is patent on color Doppler imaging with normal direction of blood flow towards the liver.  IMPRESSION: Cholelithiasis. Nondilated gallbladder with diffuse wall thickening, 5 without pericholecystic fluid or sonographic Murphy's sign. These findings are nonspecific. Chronic cholecystitis cannot be excluded. No evidence of biliary ductal dilatation. Unremarkable sonographic appearance of liver. Electronically Signed   By: Earle Gell M.D.   On: 07/22/2018 20:29    Labs:  CBC: Recent Labs    07/24/18 0158 07/25/18 0223 08/03/18 1302 08/04/18 0333  WBC 1.9* 2.2* 1.9* 1.5*  HGB 10.1* 10.1* 13.0 9.9*  HCT 32.3* 31.8* 40.7 30.4*  PLT 48* 47* 18* 14*      COAGS: Recent Labs    07/23/18 0730 08/04/18 1537  INR 1.1 1.4*    BMP: Recent Labs    07/24/18 0158 07/25/18 0223 08/03/18 1302 08/04/18 0333  NA 137 139 133* 137  K 4.0 4.0 4.8 4.1  CL 105 106 101 106  CO2 21* 23 16* 18*  GLUCOSE 94 93 124* 90  BUN 17 17 73* 59*  CALCIUM 8.3* 8.3* 7.8* 7.1*  CREATININE 1.21 1.13 2.32* 1.68*  GFRNONAA 56* >60 25* 38*  GFRAA >60 >60 29* 43*    LIVER FUNCTION TESTS: Recent Labs    07/23/18 0303 07/24/18 0158 08/03/18 1302 08/04/18 0333  BILITOT 3.3* 2.4* 5.9* 4.3*  AST 69* 71* 66* 51*  ALT 33 34 24 20  ALKPHOS 225* 236* 233* 178*  PROT 4.8* 4.6* 5.1* 3.7*  ALBUMIN 2.8* 2.6* 2.5* 1.9*     Assessment and Plan:  Pancytopenia. Plan for image-guided bone marrow biopsy/aspiration today with Dr. Pascal Lux. Patient is NPO. Afebrile. He does not take blood thinners. INR 1.4 seconds 08/04/2018.  Risks and benefits discussed with the patient including, but not limited to bleeding, infection, damage to adjacent structures or low yield requiring additional tests. All of the patient's questions were answered, patient is agreeable to proceed. Consent signed and in chart.   Thank you for this interesting consult.  I greatly enjoyed meeting Zachary Bennett and look forward to participating in their care.  A copy of this report was sent to the requesting provider on this date.  Electronically Signed: Earley Abide, PA-C 08/05/2018, 8:29 AM   I spent a total of 40 Minutes in face to face in clinical consultation, greater than 50% of which was counseling/coordinating care for pancytopenia/bone marrow biopsy.

## 2018-08-05 NOTE — Procedures (Signed)
Pre-procedure Diagnosis: Pancytopenia Post-procedure Diagnosis: Same  Technically successful CT guided bone marrow aspiration and biopsy of left iliac crest.   Complications: None Immediate  EBL: None  Signed: Sandi Mariscal Pager: [phone] 08/05/2018, 9:30 AM

## 2018-08-05 NOTE — Progress Notes (Addendum)
HEMATOLOGY-ONCOLOGY PROGRESS NOTE  SUBJECTIVE: The patient is tired this afternoon.  Had a bone marrow biopsy performed this morning.  Overall tolerated well.  Reports some abdominal discomfort due to positioning during the procedure but none at this time.  He denies any bleeding.  He has no new complaints this morning.  REVIEW OF SYSTEMS:   Constitutional: Denies fevers, chills.  Reports progressive fatigue and weakness.  Reports night sweats. Eyes: Denies blurriness of vision Ears, nose, mouth, throat, and face: Denies mucositis or sore throat Respiratory: Ports cough and shortness of breath  cardiovascular: Denies palpitation, chest discomfort Gastrointestinal: Reports abdominal pain in the center of his abdomen and in the left upper quadrant.  Reports nausea but no vomiting Skin: Denies abnormal skin rashes Lymphatics: Denies new lymphadenopathy.  Bruises easily. Neurological:Denies numbness, tingling or new weaknesses Behavioral/Psych: Mood is stable, no new changes  Extremities: Reports lower extremity edema which is new since prior admission. All other systems were reviewed with the patient and are negative.  I have reviewed the past medical history, past surgical history, social history and family history with the patient and they are unchanged from previous note.   PHYSICAL EXAMINATION: ECOG PERFORMANCE STATUS: 2 - Symptomatic, <50% confined to bed  Vitals:   08/05/18 0958 08/05/18 1008  BP: 102/61 (!) 93/51  Pulse: 89 77  Resp: 16 18  Temp: 98 F (36.7 C)   SpO2: 100% 99%   Filed Weights   08/03/18 1737 08/03/18 2210 08/04/18 2125  Weight: 166 lb 3.6 oz (75.4 kg) 168 lb 14 oz (76.6 kg) 168 lb 14 oz (76.6 kg)    GENERAL:alert, no distress and comfortable SKIN: skin color, texture, turgor are normal, no rashes or significant lesions EYES: normal, Conjunctiva are pink and non-injected, sclera clear OROPHARYNX:no exudate, no erythema and lips, buccal mucosa, and tongue  normal  NECK: supple, thyroid normal size, non-tender, without nodularity LYMPH:  no palpable lymphadenopathy in the cervical, axillary or inguinal LUNGS: clear to auscultation and percussion with normal breathing effort HEART: regular rate & rhythm and no murmurs.  1+ bilateral lower extremity edema ABDOMEN: Normal bowel sounds, abdomen soft, diffuse tenderness with light palpation.  Splenomegaly noted. Musculoskeletal:no cyanosis of digits and no clubbing  NEURO: alert & oriented x 3 with fluent speech, no focal motor/sensory deficits  LABORATORY DATA:  I have reviewed the data as listed CMP Latest Ref Rng & Units 08/04/2018 08/03/2018 07/25/2018  Glucose 70 - 99 mg/dL 90 124(H) 93  BUN 8 - 23 mg/dL 59(H) 73(H) 17  Creatinine 0.61 - 1.24 mg/dL 1.68(H) 2.32(H) 1.13  Sodium 135 - 145 mmol/L 137 133(L) 139  Potassium 3.5 - 5.1 mmol/L 4.1 4.8 4.0  Chloride 98 - 111 mmol/L 106 101 106  CO2 22 - 32 mmol/L 18(L) 16(L) 23  Calcium 8.9 - 10.3 mg/dL 7.1(L) 7.8(L) 8.3(L)  Total Protein 6.5 - 8.1 g/dL 3.7(L) 5.1(L) -  Total Bilirubin 0.3 - 1.2 mg/dL 4.3(H) 5.9(H) -  Alkaline Phos 38 - 126 U/L 178(H) 233(H) -  AST 15 - 41 U/L 51(H) 66(H) -  ALT 0 - 44 U/L 20 24 -    Lab Results  Component Value Date   WBC 1.2 (LL) 08/05/2018   HGB 10.5 (L) 08/05/2018   HCT 32.9 (L) 08/05/2018   MCV 86.1 08/05/2018   PLT 12 (LL) 08/05/2018   NEUTROABS 1.4 (L) 08/03/2018    ASSESSMENT AND PLAN: This is an 84 year old male with a 3 to 4-week history of abdominal pain  who has pancytopenia and an enlarged spleen.  Imaging obtained during his last hospital admission is concerning for lymphoma versus another malignancy.  Recommendation has been made to perform a biopsy of the spleen, but this cannot be done due to the risk of bleeding and because there are no focal splenic lesions.  CT scan of the chest was performed with contrast which shows a 14 mm pretracheal lymph node, 3 cm subcarinal lymph node, and 13 mm right  pericardial node.  The patient was seen by pulmonology who recommended a PET scan and then would see the patient for consideration of EBUS with biopsy.  PET scan was scheduled for 08/11/2018 but the patient developed worsening abdominal discomfort, shortness of breath, fatigue, and weakness and has now been readmitted.  1.  Pancytopenia 2.  Splenomegaly 3.  Enlarged mediastinal lymph nodes 4.  Community-acquired pneumonia 5.  Acute renal failure 6.  Hypoalbuminemia 7.  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  Further treatment pending results of the bone marrow biopsy.  Given his advanced age, multiple comorbidities, and decline in performance status, his treatment options may be limited. -BCR-ABL and flow cytometry pending -If we cannot get a diagnosis based on the bone marrow biopsy, will discuss with general surgery for possible splenectomy which would be potentially diagnostic and therapeutic. -Continue dexamethasone 4 mg daily for severe thrombocytopenia which could help both lymphoma and thrombocytopenia. 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  Could be causing renal dysfunction.  Status post 1 dose of rasburicase 6 mg on 08/04/2018. -Antibiotics per hospitalist team for pneumonia -Goals of care: Prolongation of life and improvement of symptoms.  The patient does not wish to be intubated or resuscitated if his heart were to stop.  Mikey Bussing, DNP, AGPCNP-BC, AOCNP   Attending Note  I personally saw the patient, reviewed the chart and examined the patient. The plan of care was discussed with the patient  Awaiting the results of bone marrow biopsy We will try to contact pathology to get results as soon as possible.

## 2018-08-05 NOTE — Progress Notes (Signed)
PHARMACY NOTE:  ANTIMICROBIAL RENAL DOSAGE ADJUSTMENT  Current antimicrobial regimen includes a mismatch between antimicrobial dosage and estimated renal function.  As per policy approved by the Pharmacy & Therapeutics and Medical Executive Committees, the antimicrobial dosage will be adjusted accordingly.  Current antimicrobial dosage:  Cefepime 2gm IV q24h  Indication: HCAP  Renal Function:  Estimated Creatinine Clearance: 33.4 mL/min (A) (by C-G formula based on SCr of 1.68 mg/dL (H)). []      On intermittent HD, scheduled: []      On CRRT    Antimicrobial dosage has been changed to:  Cefepime 2gm IV q12h  Additional comments:   Markos Theil A. Levada Dy, PharmD, [place] Please utilize [facility]ion for appropriate phone number to reach the unit pharmacist ([place])   08/05/2018 8:53 AM

## 2018-08-06 LAB — CBC WITH DIFFERENTIAL/PLATELET
Abs Immature Granulocytes: 0.01 10*3/uL (ref 0.00–0.07)
Basophils Absolute: 0 10*3/uL (ref 0.0–0.1)
Basophils Relative: 0 %
Eosinophils Absolute: 0 10*3/uL (ref 0.0–0.5)
Eosinophils Relative: 0 %
HCT: 33.5 % — ABNORMAL LOW (ref 39.0–52.0)
Hemoglobin: 10.4 g/dL — ABNORMAL LOW (ref 13.0–17.0)
Immature Granulocytes: 1 %
Lymphocytes Relative: 21 %
Lymphs Abs: 0.2 10*3/uL — ABNORMAL LOW (ref 0.7–4.0)
MCH: 26.8 pg (ref 26.0–34.0)
MCHC: 31 g/dL (ref 30.0–36.0)
MCV: 86.3 fL (ref 80.0–100.0)
Monocytes Absolute: 0.1 10*3/uL (ref 0.1–1.0)
Monocytes Relative: 13 %
Neutro Abs: 0.6 10*3/uL — ABNORMAL LOW (ref 1.7–7.7)
Neutrophils Relative %: 65 %
Platelets: 10 10*3/uL — CL (ref 150–400)
RBC: 3.88 MIL/uL — ABNORMAL LOW (ref 4.22–5.81)
RDW: 18.4 % — ABNORMAL HIGH (ref 11.5–15.5)
WBC: 1 10*3/uL — CL (ref 4.0–10.5)
nRBC: 0 % (ref 0.0–0.2)

## 2018-08-06 LAB — COMPREHENSIVE METABOLIC PANEL
ALT: 23 U/L (ref 0–44)
AST: 63 U/L — ABNORMAL HIGH (ref 15–41)
Albumin: 1.8 g/dL — ABNORMAL LOW (ref 3.5–5.0)
Alkaline Phosphatase: 188 U/L — ABNORMAL HIGH (ref 38–126)
Anion gap: 10 (ref 5–15)
BUN: 38 mg/dL — ABNORMAL HIGH (ref 8–23)
CO2: 18 mmol/L — ABNORMAL LOW (ref 22–32)
Calcium: 7 mg/dL — ABNORMAL LOW (ref 8.9–10.3)
Chloride: 110 mmol/L (ref 98–111)
Creatinine, Ser: 1.39 mg/dL — ABNORMAL HIGH (ref 0.61–1.24)
GFR calc Af Amer: 55 mL/min — ABNORMAL LOW (ref >60)
GFR calc non Af Amer: 47 mL/min — ABNORMAL LOW (ref >60)
Glucose, Bld: 98 mg/dL (ref 70–99)
Potassium: 4.5 mmol/L (ref 3.5–5.1)
Sodium: 138 mmol/L (ref 135–145)
Total Bilirubin: 3.3 mg/dL — ABNORMAL HIGH (ref 0.3–1.2)
Total Protein: 4 g/dL — ABNORMAL LOW (ref 6.5–8.1)

## 2018-08-06 LAB — TYPE AND SCREEN
ABO/RH(D): O POS
Antibody Screen: NEGATIVE

## 2018-08-06 LAB — ABO/RH: ABO/RH(D): O POS

## 2018-08-06 LAB — URIC ACID: Uric Acid, Serum: 1.2 mg/dL — ABNORMAL LOW (ref 3.7–8.6)

## 2018-08-06 MED ORDER — ACETAMINOPHEN 325 MG PO TABS
650.0000 mg | ORAL_TABLET | Freq: Once | ORAL | Status: AC
  Administered 2018-08-06: 650 mg via ORAL
  Filled 2018-08-06: qty 2

## 2018-08-06 MED ORDER — SODIUM CHLORIDE 0.9% IV SOLUTION
Freq: Once | INTRAVENOUS | Status: AC
  Administered 2018-08-13: 07:00:00 via INTRAVENOUS

## 2018-08-06 MED ORDER — SODIUM CHLORIDE 0.9 % IV SOLN
375.0000 mg/m2 | Freq: Once | INTRAVENOUS | Status: AC
  Administered 2018-08-07: 700 mg via INTRAVENOUS
  Filled 2018-08-06: qty 70

## 2018-08-06 MED ORDER — ALLOPURINOL 100 MG PO TABS
100.0000 mg | ORAL_TABLET | Freq: Two times a day (BID) | ORAL | Status: DC
  Administered 2018-08-06 – 2018-08-18 (×25): 100 mg via ORAL
  Filled 2018-08-06 (×25): qty 1

## 2018-08-06 MED ORDER — DIPHENHYDRAMINE HCL 25 MG PO CAPS
25.0000 mg | ORAL_CAPSULE | Freq: Once | ORAL | Status: AC
  Administered 2018-08-06: 25 mg via ORAL
  Filled 2018-08-06: qty 1

## 2018-08-06 MED ORDER — DIPHENHYDRAMINE HCL 50 MG/ML IJ SOLN
25.0000 mg | Freq: Once | INTRAMUSCULAR | Status: AC
  Administered 2018-08-07: 25 mg via INTRAVENOUS
  Filled 2018-08-06: qty 1

## 2018-08-06 MED ORDER — ACETAMINOPHEN 325 MG PO TABS
650.0000 mg | ORAL_TABLET | Freq: Once | ORAL | Status: AC
  Administered 2018-08-07: 650 mg via ORAL
  Filled 2018-08-06: qty 2

## 2018-08-06 MED ORDER — DIPHENHYDRAMINE HCL 25 MG PO CAPS
25.0000 mg | ORAL_CAPSULE | Freq: Once | ORAL | Status: AC
  Filled 2018-08-06: qty 1

## 2018-08-06 NOTE — Progress Notes (Signed)
Discussed plan for Rituxan administration with Dwayne, Pharmacist. Plan to administer 5/15 early AM. Will need provider consent attestation. Pharmacy will clarify pre-med orders.

## 2018-08-06 NOTE — Progress Notes (Signed)
PROGRESS NOTE  Zachary Bennett ZJI:[id number] DOB: [birth year] DOA: 08/03/2018 PCP: Nicholas Lose, MD   LOS: 2 days   Brief narrative: Zachary Bennett is a 84 y.o. male with medical history significant of thrombocytopenia and hyperlipidemia; who presented with complaints of abdominal pain and worsening shortness of breath.  Patient had just been admitted to the hospital from 4/29-5/2 after being found to have splenomegaly with pancytopenia with imaging concerning for lymphoma.  Patient was evaluated by Dr. Lindi Adie and ultimately the plan had been for an outpatient PET scan for targeted biopsy of a hypermetabolic site.  However, patient reported that the PET scan was not scheduled until the 19th of this month.  Since being home, he reported that he had decreased appetite, unable to eat much solid food as he reports that tastes like leather and has a difficult time swallowing it. He also complained of generalized weakness and ambulatory dysfunction with shortness of breath.     Upon admission into the emergency department, patient was noted to be afebrile, pulse 92-100, respirations 18-24, blood pressure 99/58-108/62, and O2 saturation maintained on room air. Patient was placed on 2 L nasal cannula oxygen for comfort.  Labs revealed WBC 1.9, platelets 18, sodium 133, CO2 16, BUN 73, creatinine 2.32, calcium 7.8, alkaline phosphatase 233, albumin 2.5, lipase 68, AST 66 , and total bilirubin 5.9.  Chest x-ray showed right lower lobe infiltrate concerning for possible pneumonia.  Patient was given 1 L normal saline IV fluids, vancomycin, and cefepime.  Patient was then considered for admission to the hospital.  Subjective: Patient seen resting comfortably in bed, continues with generalized fatigue/weakness.  Also with abdominal discomfort and distention.  Awaiting bone marrow biopsy results.  Oncology following.  No other complaints at this time. Denies headache, no fever/chills/night sweats, no  nausea/vomiting/diarrhea, no chest pain, no palpitations, no shortness of breath, no paresthesias.  No acute events overnight per nursing staff.  Assessment/Plan:  Principal Problem:   ARF (acute renal failure) (HCC) Active Problems:   Thrombocytopenia (HCC)   Elevated LFTs   Hyperbilirubinemia   Splenomegaly   CAP (community acquired pneumonia)   Dyspnea   Prolonged QT interval  Acute kidney injury  Etiology likely secondary to volume depletion with poor oral intake prior to hospitalization. likely secondary to volume depletion from poor oral intake.  Also consideration of hyperuricemia. Creatinine on admission 2.32. --continue with IV fluid hydration.   --Creatinine improving 2.32-->1.68-->1.39 (baseline 1.25 06/2018) --Avoid nephrotoxins, renally dose all medications --daily BMP  Dyspnea likely secondary to community-acquired pneumonia.   On Supplemental oxygen.  Will wean oxygen as able.  Chest x-ray showing signs of a ill-defined airspace opacity in the right medial lung base concerning for possible pneumonia.    Patient does have a large mediastinal lymphadenopathy as well.  Afebrile admission with a white blood cell count of 1.5. --COVID-19 negative --Urine Legionella antigen negative --MRSA PCR negative, discontinue vancomycin today  --Continue cefepime given the recent hospitalization. --Blood cultures no growth x24 hours --Titrate supplemental oxygen to maintain SPO2 greater than 92% --Incentive spirometry --Continue supportive care.   Hyperuricemia Uric acid level 10.9 on admission. --s/p rasburicase 6 mg IV on 08/04/2018; uric acid down to 1.2  Abdominal pain,  secondary to splenomegaly:  Patient noted to have previous CT scan showing severe splenomegaly pretracheal lymph nodes and subcarinal lymph enlargement from 4/29. Oncology on board and had recommended PET scan which is scheduled for 5/19, to evaluate most hypermetabolic area for biopsy.   --status post  IR  bone marrow biopsy 5/14 which was negative for lymphoma but with myelodysplasia --Oncology d/w with general surgery regarding possible need for splenectomy, defer for now --continue oxycodone/fentanyl as needed for abdominal pain.   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 Lindi Adie following, appreciate assistance.  No active bleeding at this time.   --Platelet count down to 14-->10 --No lymphoma noted on bone marrow biopsy prelim report, but myelodysplasia --Continue dexamethasone 4 mg daily --Oncology team plans platelet transfusion today --Rituxan infusion on 08/07/2018 for ITP unresponsive to steroids  Elevated bilirubin/elevated LFTs:  Bilirubin level has slightly improved.. Patient previously noted to have cholelithiasis without concerning symptoms for acute cholecystitis during last hospitalization. May warrant reimaging if symptoms persist  Prolonged QT interval:  QTc503 on admission.  --avoid QT prolonging medications  Hyperlipidemia. Continue to hold statins.  Tobacco abuse:  50-pack-year history.  Quit smoking 2 weeks back.  Emphasized to abstain from smoking.   VTE Prophylaxis: SCDs  Code Status: Full  Family Communication: none  Disposition Plan: Home likely in 2 to 3 days, once patient is clinically stable. Continue iv antibiotics and plan for Rituxan infusion by oncology on 08/07/2018.  Follow cultures.  PT evaluation.  Consultants:  Oncology  Interventional radiology  General surgery  Procedures:  IR bone marrow biopsy 08/05/2018   Antibiotics: Anti-infectives (From admission, onward)   Start     Dose/Rate Route Frequency Ordered Stop   08/05/18 1600  vancomycin (VANCOCIN) 1,250 mg in sodium chloride 0.9 % 250 mL IVPB  Status:  Discontinued     1,250 mg 166.7 mL/hr over 90 Minutes Intravenous Every 48 hours 08/03/18 1449 08/05/18 1248   08/05/18 1000  ceFEPIme (MAXIPIME) 2 g in sodium chloride 0.9 % 100 mL IVPB     2 g 200 mL/hr over 30  Minutes Intravenous Every 12 hours 08/05/18 0852     08/04/18 1400  ceFEPIme (MAXIPIME) 2 g in sodium chloride 0.9 % 100 mL IVPB  Status:  Discontinued     2 g 200 mL/hr over 30 Minutes Intravenous Every 24 hours 08/03/18 1541 08/05/18 0852   08/03/18 1430  ceFEPIme (MAXIPIME) 2 g in sodium chloride 0.9 % 100 mL IVPB     2 g 200 mL/hr over 30 Minutes Intravenous  Once 08/03/18 1420 08/03/18 1512   08/03/18 1430  vancomycin (VANCOCIN) 1,500 mg in sodium chloride 0.9 % 500 mL IVPB     1,500 mg 250 mL/hr over 120 Minutes Intravenous  Once 08/03/18 1421 08/03/18 1744      Objective: Vitals:   08/06/18 0611 08/06/18 0932  BP: 106/66 104/69  Pulse: 89 74  Resp: (!) 22 18  Temp: 98.2 F (36.8 C) (!) 97.4 F (36.3 C)  SpO2: 100% 99%    Intake/Output Summary (Last 24 hours) at 08/06/2018 1447 Last data filed at 08/06/2018 1300 Gross per 24 hour  Intake 2561.79 ml  Output 700 ml  Net 1861.79 ml   Filed Weights   08/03/18 2210 08/04/18 2125 08/05/18 2048  Weight: 76.6 kg 76.6 kg 76.4 kg   Body mass index is 25.61 kg/m.   Physical Exam: GENERAL: Patient is alert awake and oriented.  On nasal cannula oxygen. HENT: No scleral pallor or icterus. Pupils equally reactive to light. Oral mucosa is moist NECK: is supple, no palpable thyroid enlargement. CHEST: Clear to auscultation. No crackles or wheezes. Non tender on palpation. Diminished breath sounds bilaterally. CVS: S1 and S2 heard, no murmur. Regular rate and rhythm. No pericardial rub.  ABDOMEN: Soft, bowel sounds are present.  Palpable spleen.  Diffuse tenderness of the abdomen. EXTREMITIES: Bilateral lower extremity edema. CNS: Cranial nerves are intact. No focal motor or sensory deficits. SKIN: warm and dry without rashes.  Data Review: I have personally reviewed the following laboratory data and studies,  CBC: Recent Labs  Lab 08/03/18 1302 08/04/18 0333 08/05/18 0615 08/06/18 0449  WBC 1.9* 1.5* 1.2* 1.0*  NEUTROABS  1.4*  --   --  0.6*  HGB 13.0 9.9* 10.5* 10.4*  HCT 40.7 30.4* 32.9* 33.5*  MCV 82.9 82.8 86.1 86.3  PLT 18* 14* 12* 10*   Basic Metabolic Panel: Recent Labs  Lab 08/03/18 1302 08/04/18 0333 08/06/18 0449  NA 133* 137 138  K 4.8 4.1 4.5  CL 101 106 110  CO2 16* 18* 18*  GLUCOSE 124* 90 98  BUN 73* 59* 38*  CREATININE 2.32* 1.68* 1.39*  CALCIUM 7.8* 7.1* 7.0*   Liver Function Tests: Recent Labs  Lab 08/03/18 1302 08/04/18 0333 08/06/18 0449  AST 66* 51* 63*  ALT _0 ALKPHOS 233* 178* 188*  BILITOT 5.9* 4.3* 3.3*  PROT 5.1* 3.7* 4.0*  ALBUMIN 2.5* 1.9* 1.8*   Recent Labs  Lab 08/03/18 1302  LIPASE 68*   No results for input(s): AMMONIA in the last 168 hours. Cardiac Enzymes: Recent Labs  Lab 08/03/18 1302  TROPONINI <0.03   BNP (last 3 results) Recent Labs    08/03/18 1303  BNP 73.0    ProBNP (last 3 results) No results for input(s): PROBNP in the last 8760 hours.  CBG: No results for input(s): GLUCAP in the last 168 hours. Recent Results (from the past 240 hour(s))  SARS Coronavirus 2 (CEPHEID- Performed in [facility] hospital lab), Hosp Order     Status: None   Collection Time: 08/03/18  2:45 PM  Result Value Ref Range Status   SARS Coronavirus 2 NEGATIVE NEGATIVE Final    Comment: (NOTE) If result is NEGATIVE SARS-CoV-2 target nucleic acids are NOT DETECTED. The SARS-CoV-2 RNA is generally detectable in upper and lower  respiratory specimens during the acute phase of infection. The lowest  concentration of SARS-CoV-2 viral copies this assay can detect is 250  copies / mL. A negative result does not preclude SARS-CoV-2 infection  and should not be used as the sole basis for treatment or other  patient management decisions.  A negative result may occur with  improper specimen collection / handling, submission of specimen other  than nasopharyngeal swab, presence of viral mutation(s) within the  areas targeted by this assay, and  inadequate number of viral copies  (<250 copies / mL). A negative result must be combined with clinical  observations, patient history, and epidemiological information. If result is POSITIVE SARS-CoV-2 target nucleic acids are DETECTED. The SARS-CoV-2 RNA is generally detectable in upper and lower  respiratory specimens dur ing the acute phase of infection.  Positive  results are indicative of active infection with SARS-CoV-2.  Clinical  correlation with patient history and other diagnostic information is  necessary to determine patient infection status.  Positive results do  not rule out bacterial infection or co-infection with other viruses. If result is PRESUMPTIVE POSTIVE SARS-CoV-2 nucleic acids MAY BE PRESENT.   A presumptive positive result was obtained on the submitted specimen  and confirmed on repeat testing.  While 2019 novel coronavirus  (SARS-CoV-2) nucleic acids may be present in the submitted sample  additional confirmatory testing may be necessary for epidemiological  and / or clinical management purposes  to differentiate between  SARS-CoV-2 and other Sarbecovirus currently known to infect humans.  If clinically indicated additional testing with an alternate test  methodology [id number]) is advised. The SARS-CoV-2 RNA is generally  detectable in upper and lower respiratory sp ecimens during the acute  phase of infection. The expected result is Negative. Fact Sheet for Patients:  StrictlyIdeas.no Fact Sheet for Healthcare Providers: BankingDealers.co.za This test is not yet approved or cleared by the [place] FDA and has been authorized for detection and/or diagnosis of SARS-CoV-2 by FDA under an Emergency Use Authorization (EUA).  This EUA will remain in effect (meaning this test can be used) for the duration of the COVID-19 declaration under Section 564(b)(1) of the Act, 21 U.S.C. section 360bbb-3(b)(1), unless the  authorization is terminated or revoked sooner. Performed at [facility], [place] [address], [place], [place] [address]   Culture, blood (routine x 2) Call MD if unable to obtain prior to antibiotics being given     Status: None (Preliminary result)   Collection Time: 08/03/18  6:33 PM  Result Value Ref Range Status   Specimen Description BLOOD BLOOD LEFT FOREARM  Final   Special Requests   Final    BOTTLES DRAWN AEROBIC ONLY Blood Culture adequate volume   Culture   Final    NO GROWTH 3 DAYS Performed at [facility], [place] [address], [place], [place] [address]    Report Status PENDING  Incomplete  Culture, blood (routine x 2) Call MD if unable to obtain prior to antibiotics being given     Status: None (Preliminary result)   Collection Time: 08/03/18  6:33 PM  Result Value Ref Range Status   Specimen Description BLOOD LEFT ANTECUBITAL  Final   Special Requests   Final    BOTTLES DRAWN AEROBIC ONLY Blood Culture adequate volume   Culture   Final    NO GROWTH 3 DAYS Performed at [facility], [address] [place], [place] [address]    Report Status PENDING  Incomplete  MRSA PCR Screening     Status: None   Collection Time: 08/05/18 10:19 AM  Result Value Ref Range Status   MRSA by PCR NEGATIVE NEGATIVE Final    Comment:        The GeneXpert MRSA Assay (FDA approved for NASAL specimens only), is one component of a comprehensive MRSA colonization surveillance program. It is not intended to diagnose MRSA infection nor to guide or monitor treatment for MRSA infections. Performed at [facility], [place] [address], [place], [place] [address]      Studies: Ct Bone Marrow Biopsy & Aspiration  Result Date: 08/05/2018 INDICATION: Pancytopenia and splenomegaly. Please perform CT-guided biopsy for tissue diagnostic purposes. EXAM: CT-GUIDED BONE MARROW BIOPSY AND ASPIRATION MEDICATIONS: None ANESTHESIA/SEDATION: Fentanyl 25 mcg IV; Versed 1 mg IV Sedation  Time: 11 Minutes; The patient was continuously monitored during the procedure by the interventional radiology nurse under my direct supervision. COMPLICATIONS: None immediate. PROCEDURE: Informed consent was obtained from the patient following an explanation of the procedure, risks, benefits and alternatives. The patient understands, agrees and consents for the procedure. All questions were addressed. A time out was performed prior to the initiation of the procedure. The patient was positioned prone and non-contrast localization CT was performed of the pelvis to demonstrate the iliac marrow spaces. The operative site was prepped and draped in the usual sterile fashion. Under sterile conditions and  local anesthesia, a 22 gauge spinal needle was utilized for procedural planning. Next, an 11 gauge coaxial bone biopsy needle was advanced into the left iliac marrow space. Needle position was confirmed with CT imaging. Initially, bone marrow aspiration was performed. Next, a bone marrow biopsy was obtained with the 11 gauge outer bone marrow device. Samples were prepared with the cytotechnologist and deemed adequate. The needle was removed intact. Hemostasis was obtained with compression and a dressing was placed. The patient tolerated the procedure well without immediate post procedural complication. IMPRESSION: Successful CT guided left iliac bone marrow aspiration and core biopsy. Electronically Signed   By: Sandi Mariscal M.D.   On: 08/05/2018 11:46    Scheduled Meds: . sodium chloride   Intravenous Once  . acetaminophen  650 mg Oral Once  . allopurinol  100 mg Oral BID  . dexamethasone  4 mg Intravenous Q24H  . diphenhydrAMINE  25 mg Oral Once  . [START ON 08/07/2018] diphenhydrAMINE  25 mg Intravenous Once  . guaiFENesin  600 mg Oral BID  . riTUXimab (RITUXAN) IV infusion  375 mg/m2 Intravenous Once  . zolpidem  5 mg Oral QHS    Continuous Infusions: . sodium chloride 100 mL/hr at 08/06/18 0847  .  ceFEPime (MAXIPIME) IV 2 g (08/06/18 1024)     Kourtnee Lahey J [place] DO  [facility] 08/06/2018

## 2018-08-06 NOTE — Progress Notes (Addendum)
HEMATOLOGY-ONCOLOGY PROGRESS NOTE  SUBJECTIVE:  Has ongoing fatigue. Reported a small amount of blood in his stool this am. Has more abdominal discomfort. Feels more swollen. No additional new complaints this morning.   REVIEW OF SYSTEMS:   Constitutional: Denies fevers, chills.  Reports progressive fatigue and weakness.  Reports night sweats. Eyes: Denies blurriness of vision Ears, nose, mouth, throat, and face: Denies mucositis or sore throat Respiratory: Ports cough and shortness of breath  cardiovascular: Denies palpitation, chest discomfort Gastrointestinal: Reports abdominal pain in the center of his abdomen and in the left upper quadrant.  Reports nausea but no vomiting Skin: Denies abnormal skin rashes Lymphatics: Denies new lymphadenopathy.  Bruises easily. Neurological:Denies numbness, tingling or new weaknesses Behavioral/Psych: Mood is stable, no new changes  Extremities: Reports lower extremity edema which is new since prior admission. All other systems were reviewed with the patient and are negative.  I have reviewed the past medical history, past surgical history, social history and family history with the patient and they are unchanged from previous note.   PHYSICAL EXAMINATION: ECOG PERFORMANCE STATUS: 2 - Symptomatic, <50% confined to bed  Vitals:   08/05/18 2048 08/06/18 0611  BP: 99/62 106/66  Pulse: 88 89  Resp: (!) 21 (!) 22  Temp: 98.4 F (36.9 C) 98.2 F (36.8 C)  SpO2: 99% 100%   Filed Weights   08/03/18 2210 08/04/18 2125 08/05/18 2048  Weight: 168 lb 14 oz (76.6 kg) 168 lb 14 oz (76.6 kg) 168 lb 6.9 oz (76.4 kg)    GENERAL:alert, no distress and comfortable SKIN: skin color, texture, turgor are normal, no rashes or significant lesions. Multiple bruises noted over bilateral arms.  EYES: normal, Conjunctiva are pink and non-injected, sclera clear OROPHARYNX:no exudate, no erythema and lips, buccal mucosa, and tongue normal  NECK: supple, thyroid  normal size, non-tender, without nodularity LYMPH:  no palpable lymphadenopathy in the cervical, axillary or inguinal LUNGS: clear to auscultation and percussion with normal breathing effort HEART: regular rate & rhythm and no murmurs.  1+ bilateral lower extremity edema. Arms are edematous.  ABDOMEN: Normal bowel sounds, abdomen soft, diffuse tenderness with light palpation.  Splenomegaly noted. Musculoskeletal:no cyanosis of digits and no clubbing  NEURO: alert & oriented x 3 with fluent speech, no focal motor/sensory deficits  LABORATORY DATA:  I have reviewed the data as listed CMP Latest Ref Rng & Units 08/06/2018 08/04/2018 08/03/2018  Glucose 70 - 99 mg/dL 98 90 124(H)  BUN 8 - 23 mg/dL 38(H) 59(H) 73(H)  Creatinine 0.61 - 1.24 mg/dL 1.39(H) 1.68(H) 2.32(H)  Sodium 135 - 145 mmol/L 138 137 133(L)  Potassium 3.5 - 5.1 mmol/L 4.5 4.1 4.8  Chloride 98 - 111 mmol/L 110 106 101  CO2 22 - 32 mmol/L 18(L) 18(L) 16(L)  Calcium 8.9 - 10.3 mg/dL 7.0(L) 7.1(L) 7.8(L)  Total Protein 6.5 - 8.1 g/dL 4.0(L) 3.7(L) 5.1(L)  Total Bilirubin 0.3 - 1.2 mg/dL 3.3(H) 4.3(H) 5.9(H)  Alkaline Phos 38 - 126 U/L 188(H) 178(H) 233(H)  AST 15 - 41 U/L 63(H) 51(H) 66(H)  ALT 0 - 44 U/L 23 20 24     Lab Results  Component Value Date   WBC 1.0 (LL) 08/06/2018   HGB 10.4 (L) 08/06/2018   HCT 33.5 (L) 08/06/2018   MCV 86.3 08/06/2018   PLT 10 (LL) 08/06/2018   NEUTROABS 0.6 (L) 08/06/2018    ASSESSMENT AND PLAN: This is an 84 year old male with a 3 to 4-week history of abdominal pain who has pancytopenia and  an enlarged spleen.  Imaging obtained during his last hospital admission is concerning for lymphoma versus another malignancy.  Recommendation has been made to perform a biopsy of the spleen, but this cannot be done due to the risk of bleeding and because there are no focal splenic lesions.  CT scan of the chest was performed with contrast which shows a 14 mm pretracheal lymph node, 3 cm subcarinal lymph  node, and 13 mm right pericardial node.  The patient was seen by pulmonology who recommended a PET scan and then would see the patient for consideration of EBUS with biopsy.  PET scan was scheduled for 08/11/2018 but the patient developed worsening abdominal discomfort, shortness of breath, fatigue, and weakness and has now been readmitted.  1.  Pancytopenia 2.  Splenomegaly 3.  Enlarged mediastinal lymph nodes 4.  Community-acquired pneumonia 5.  Acute renal failure 6.  Hypoalbuminemia 7.  Hyperuricemia  -Findings are concerning for high-grade lymphoma -Bone marrow biopsy performed yesterday and will await results.  Further treatment pending results of the bone marrow biopsy.  Given his advanced age, multiple comorbidities, and decline in performance status, his treatment options may be limited. -BCR-ABL and flow cytometry pending -If we cannot get a diagnosis based on the bone marrow biopsy, will discuss with general surgery for possible splenectomy which would be potentially diagnostic and therapeutic. -Continue dexamethasone 4 mg daily for severe thrombocytopenia which could help both lymphoma and thrombocytopenia. -Will transfuse 1 unit of platelets today due to blood in stool. Risks/benefits of transfusion discussed and he is agreeable to proceed. Orders have been entered.  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  Could be causing renal dysfunction.  Status post 1 dose of rasburicase 6 mg on 08/04/2018 with significant drop in uric acid level.  -Antibiotics per hospitalist team for pneumonia. Blood cultures remain negative to date.  -Goals of care: Prolongation of life and improvement of symptoms.  The patient does not wish to be intubated or resuscitated if his heart were to stop.  Mikey Bussing, DNP, AGPCNP-BC, AOCNP  ADDENDUM: Preliminary bone marrow biopsy does not show any evidence of lymphoma. Bone marrow consistent with myelodysplasia. Flow cytometry is pending. Will give  Rituxan on 08/07/2018 for treatment of ITP that is unresponsive to steroids already started. I have discussed the bone marrow biopsy results with the patient.  Discussed the side effects of Rituxan with the patient including the risk of infusion reaction. The patient would like to proceed with this medication. Will initiate Allopurinol today due to risk of TLS.   Mikey Bussing, DNP, AGPCNP-BC, AOCNP   ADDENDUM: 1. Clinical suspicion of lymphoma and ITP: BM Bx: Early findings are not diagnostic of lymphoma (doesn't rule out lymphoma) Patient is slowly deteriorating and theres no way he can gte discharged to get the PET and Bronch EBUS. Therefore after much discussion and thought, we are proceeding with first dose of Rituxan tomorrow.  CONSENT ATTESTATION: I discussed the risks of Rituxan including the risk of infusion reactions which can even be life threatening as well as risks of tumor lysis, cytopenias, risk of infections and worsening clinical condition and even death. I called patients son Ronalee Belts and counseled both of them together.  They understood the risks and benefits and consented to proceed tomorrow.  2. Elevated Bilirubin and Renal dysfunction Will follow closely

## 2018-08-06 NOTE — Progress Notes (Signed)
Dr Lindi Adie prefers to use Hepatitis B surf Ag test results from 06/04/16. Pt has never received Rituxan in past. Requested Hepatitis B labs ordered for tomorrow be cancelled.  MD in agreement.  I have communicated w/ [facility] pharmacists. Kennith Center, Pharm.D., CPP 08/06/2018@4 :38 PM

## 2018-08-06 NOTE — Evaluation (Signed)
Physical Therapy Evaluation Patient Details Name: Zachary Bennett MRN: [id number] DOB: Feb 27, 1935 Today's Date: 08/06/2018   History of Present Illness  84 y.o. male admitted with generalized weakness SOB, found to have CAP and AKI, abdominal pain secondary to splenomegaly. Pt currently awaiting PET scan for prior concern of lymphoma.     Clinical Impression  Pt admitted with above diagnosis. Pt currently with functional limitations due to the deficits listed below (see PT Problem List). PTA, pt at home living with wife and his son, 3 weeks ago independent, with worsening mobility over last several weeks. He reports his breathing has improved but now feels much weaker and has abdominal pain limiting him. Today waling short distances on RA without desat, use of RW and contact guard. Will progress and hopefully to a level where he can return home with his family and physical assistance from his son.  Pt will benefit from skilled PT to increase their independence and safety with mobility to allow discharge to the venue listed below.        Follow Up Recommendations Home health PT;Supervision/Assistance - 24 hour(and Sons physical assistance)    Equipment Recommendations  (TBD)    Recommendations for Other Services OT consult     Precautions / Restrictions Precautions Precautions: Fall      Mobility  Bed Mobility Overal bed mobility: Independent                Transfers Overall transfer level: Modified independent Equipment used: Rolling walker (2 wheeled)                Ambulation/Gait Ambulation/Gait assistance: Min guard Gait Distance (Feet): 15 Feet Assistive device: Rolling walker (2 wheeled) Gait Pattern/deviations: Step-to pattern Gait velocity: decreased   General Gait Details: weak and unsteady, patient at 95% SpO2 on RA.   Stairs            Wheelchair Mobility    Modified Rankin (Stroke Patients Only)       Balance                                              Pertinent Vitals/Pain Pain Assessment: Faces Faces Pain Scale: Hurts even more Pain Location: stomach Pain Descriptors / Indicators: Aching Pain Intervention(s): Limited activity within patient's tolerance    Home Living Family/patient expects to be discharged to:: Private residence Living Arrangements: Spouse/significant other;Children Available Help at Discharge: Family;Available 24 hours/day Type of Home: House Home Access: Level entry     Home Layout: One level Home Equipment: Walker - 2 wheels;Bedside commode;Shower seat;Wheelchair - manual      Prior Function Level of Independence: Independent               Hand Dominance        Extremity/Trunk Assessment   Upper Extremity Assessment Upper Extremity Assessment: Overall WFL for tasks assessed    Lower Extremity Assessment Lower Extremity Assessment: Overall WFL for tasks assessed       Communication   Communication: No difficulties  Cognition Arousal/Alertness: Awake/alert Behavior During Therapy: WFL for tasks assessed/performed Overall Cognitive Status: Within Functional Limits for tasks assessed                                        General Comments  Exercises     Assessment/Plan    PT Assessment Patient needs continued PT services  PT Problem List Decreased strength       PT Treatment Interventions DME instruction;Gait training;Stair training;Functional mobility training;Therapeutic activities;Therapeutic exercise    PT Goals (Current goals can be found in the Care Plan section)  Acute Rehab PT Goals Patient Stated Goal: go home with family PT Goal Formulation: With patient Potential to Achieve Goals: Good    Frequency Min 3X/week   Barriers to discharge Inaccessible home environment      Co-evaluation               AM-PAC PT "6 Clicks" Mobility  Outcome Measure Help needed turning from your back to your  side while in a flat bed without using bedrails?: None Help needed moving from lying on your back to sitting on the side of a flat bed without using bedrails?: None Help needed moving to and from a bed to a chair (including a wheelchair)?: A Little Help needed standing up from a chair using your arms (e.g., wheelchair or bedside chair)?: A Lot Help needed to walk in hospital room?: A Lot Help needed climbing 3-5 steps with a railing? : Total 6 Click Score: 16    End of Session Equipment Utilized During Treatment: Gait belt Activity Tolerance: Patient tolerated treatment well Patient left: in bed Nurse Communication: Mobility status PT Visit Diagnosis: Unsteadiness on feet (R26.81)    Time: 7782-4235 PT Time Calculation (min) (ACUTE ONLY): 25 min   Charges:   PT Evaluation $PT Eval Moderate Complexity: 1 Mod PT Treatments $Gait Training: 8-22 mins        Reinaldo Berber, PT, DPT Acute Rehabilitation Services Pager: [phone] Office: [place] 08/06/2018, 3:40 PM

## 2018-08-07 DIAGNOSIS — E883 Tumor lysis syndrome: Secondary | ICD-10-CM

## 2018-08-07 LAB — CBC WITH DIFFERENTIAL/PLATELET
Abs Immature Granulocytes: 0.01 10*3/uL (ref 0.00–0.07)
Basophils Absolute: 0 10*3/uL (ref 0.0–0.1)
Basophils Relative: 0 %
Eosinophils Absolute: 0 10*3/uL (ref 0.0–0.5)
Eosinophils Relative: 0 %
HCT: 30.4 % — ABNORMAL LOW (ref 39.0–52.0)
Hemoglobin: 9.7 g/dL — ABNORMAL LOW (ref 13.0–17.0)
Immature Granulocytes: 1 %
Lymphocytes Relative: 20 %
Lymphs Abs: 0.2 10*3/uL — ABNORMAL LOW (ref 0.7–4.0)
MCH: 27.2 pg (ref 26.0–34.0)
MCHC: 31.9 g/dL (ref 30.0–36.0)
MCV: 85.4 fL (ref 80.0–100.0)
Monocytes Absolute: 0.1 10*3/uL (ref 0.1–1.0)
Monocytes Relative: 12 %
Neutro Abs: 0.6 10*3/uL — ABNORMAL LOW (ref 1.7–7.7)
Neutrophils Relative %: 67 %
Platelets: 10 10*3/uL — CL (ref 150–400)
RBC: 3.56 MIL/uL — ABNORMAL LOW (ref 4.22–5.81)
RDW: 18.3 % — ABNORMAL HIGH (ref 11.5–15.5)
WBC: 0.9 10*3/uL — CL (ref 4.0–10.5)
nRBC: 2.3 % — ABNORMAL HIGH (ref 0.0–0.2)

## 2018-08-07 LAB — PREPARE PLATELET PHERESIS: Unit division: 0

## 2018-08-07 LAB — BASIC METABOLIC PANEL
Anion gap: 5 (ref 5–15)
BUN: 39 mg/dL — ABNORMAL HIGH (ref 8–23)
CO2: 17 mmol/L — ABNORMAL LOW (ref 22–32)
Calcium: 6.7 mg/dL — ABNORMAL LOW (ref 8.9–10.3)
Chloride: 115 mmol/L — ABNORMAL HIGH (ref 98–111)
Creatinine, Ser: 1.18 mg/dL (ref 0.61–1.24)
GFR calc Af Amer: >60 mL/min (ref >60)
GFR calc non Af Amer: 58 mL/min — ABNORMAL LOW (ref >60)
Glucose, Bld: 117 mg/dL — ABNORMAL HIGH (ref 70–99)
Potassium: 4.5 mmol/L (ref 3.5–5.1)
Sodium: 137 mmol/L (ref 135–145)

## 2018-08-07 LAB — BPAM PLATELET PHERESIS
Blood Product Expiration Date: 202005151145
ISSUE DATE / TIME: 202005141829
Unit Type and Rh: 6200

## 2018-08-07 LAB — HEPATITIS B SURFACE ANTIGEN: Hepatitis B Surface Ag: NEGATIVE

## 2018-08-07 LAB — HEPATITIS B CORE ANTIBODY, TOTAL: Hep B Core Total Ab: NEGATIVE

## 2018-08-07 LAB — HEPATITIS B SURFACE ANTIBODY,QUALITATIVE: Hep B S Ab: NONREACTIVE

## 2018-08-07 MED ORDER — DOCUSATE SODIUM 100 MG PO CAPS
100.0000 mg | ORAL_CAPSULE | Freq: Two times a day (BID) | ORAL | Status: DC
  Administered 2018-08-07 – 2018-08-18 (×21): 100 mg via ORAL
  Filled 2018-08-07 (×23): qty 1

## 2018-08-07 NOTE — Consult Note (Signed)
   [facility] CM Inpatient Consult   08/07/2018  Zachary Bennett 1935/03/16 [id number]  Patient screened for risk score for unplanned readmission score  and/or for 2 hospitalizations t with one in the past 30 days.  Chart reviewed to check if potential [facility] services are needed.  Review of patient's medical record reveals patient is Zachary Bennett is a 84 y.o. male with medical history significant of thrombocytopenia and hyperlipidemia; who presents with complaints of abdominal pain and worsening shortness of breath.  Patient had just been admitted to the hospital from 4/29-5/2 after being found to have splenomegaly with pancytopenia with imaging concerning for lymphoma.  Patient was evaluated by Dr. Lindi Adie and ultimately the plan had been for an outpatient PET scan for targeted biopsy of a hypermetabolic site.  However, patient reports that the PET scan was not scheduled until the 19th of this month.  Since being home he reports that he had decreased appetite unable to eat much solid food as he reports that tastes like leather and has a difficult time swallowing it.  Complains of generalized weakness for which he is unable to ambulate long distances and reports having difficulty getting off the toilet seat when he is having to use the restroom.  He also reports that he is having difficulty sleeping at night due to shortness of breath. [per MD history and Physical notes.  Primary Care Provider is listed as Dr. Nicholas Lose in [facility] however, this is an oncologist. His primary care provider was listed as  have a primary care provider is noted to be Dr. Merrilee Seashore, otherwise. Chart review of PT notes are recommending home with home health and sons for assistance of 24 hour care needs. Patient is in the Medicare [place].  Will continue to follow for disposition and needs with the inpatient [facility] team.  Please place a [facility] consult as appropriate and for  questions contact:   Natividad Brood, RN BSN [facility] Liaison  [phone] business mobile phone Toll free office [phone]  Fax number: [phone] [email or web address]

## 2018-08-07 NOTE — Progress Notes (Signed)
Physical Therapy Treatment Patient Details Name: Zachary Bennett MRN: [id number] DOB: 17-May-1934 Today's Date: 08/07/2018    History of Present Illness 84 y.o. male admitted with generalized weakness SOB, found to have CAP and AKI, abdominal pain secondary to splenomegaly. Pt currently awaiting PET scan for prior concern of lymphoma.     PT Comments    Pt states he feels worse today noted first day of Rituxan per chart, requesting bed level session. Focused on education of bed level therex. Will cont to follow and progress as able.    Follow Up Recommendations  Home health PT;Supervision/Assistance - 24 hour(and Sons physical support)     Equipment Recommendations  (TBD)    Recommendations for Other Services OT consult     Precautions / Restrictions Precautions Precautions: Fall Restrictions Weight Bearing Restrictions: No    Mobility  Bed Mobility Overal bed mobility: Independent             General bed mobility comments: pt first day beginign chemo, weak and not wishing for mobilty.   Transfers                    Ambulation/Gait                 Stairs             Wheelchair Mobility    Modified Rankin (Stroke Patients Only)       Balance                                            Cognition Arousal/Alertness: Awake/alert Behavior During Therapy: WFL for tasks assessed/performed Overall Cognitive Status: Within Functional Limits for tasks assessed                                        Exercises General Exercises - Lower Extremity Ankle Circles/Pumps: 20 reps Quad Sets: 20 reps Heel Slides: 20 reps    General Comments        Pertinent Vitals/Pain Faces Pain Scale: Hurts even more Pain Location: stomach Pain Descriptors / Indicators: Aching Pain Intervention(s): Limited activity within patient's tolerance    Home Living                      Prior Function             PT Goals (current goals can now be found in the care plan section) Acute Rehab PT Goals Patient Stated Goal: go home with family PT Goal Formulation: With patient Potential to Achieve Goals: Fair    Frequency    Min 3X/week      PT Plan      Co-evaluation              AM-PAC PT "6 Clicks" Mobility   Outcome Measure  Help needed turning from your back to your side while in a flat bed without using bedrails?: None Help needed moving from lying on your back to sitting on the side of a flat bed without using bedrails?: None Help needed moving to and from a bed to a chair (including a wheelchair)?: A Little Help needed standing up from a chair using your arms (e.g., wheelchair or bedside chair)?: A Lot Help needed to walk in hospital room?: A  Lot Help needed climbing 3-5 steps with a railing? : Total 6 Click Score: 16    End of Session Equipment Utilized During Treatment: Gait belt Activity Tolerance: Patient tolerated treatment well Patient left: in bed Nurse Communication: Mobility status PT Visit Diagnosis: Unsteadiness on feet (R26.81)     Time: 1173-5670 PT Time Calculation (min) (ACUTE ONLY): 18 min  Charges:  $Therapeutic Exercise: 8-22 mins                     Reinaldo Berber, PT, DPT Acute Rehabilitation Services Pager: [phone] Office: [place] 08/07/2018, 2:45 PM

## 2018-08-07 NOTE — Progress Notes (Addendum)
HEMATOLOGY-ONCOLOGY PROGRESS NOTE  SUBJECTIVE:  Has ongoing fatigue.  Denies bleeding this morning.  Reports increased congested cough.  Having yellow sputum production.  No hemoptysis.  Remains afebrile.  Reports mild abdominal discomfort this morning which is unchanged and intermittent nausea.  He denies vomiting.  He remains edematous.  Rituxan is currently hanging and so far tolerating this well.  He has no other complaints this morning.  REVIEW OF SYSTEMS:   Constitutional: Denies fevers, chills.  Reports progressive fatigue and weakness.  Reports night sweats. Eyes: Denies blurriness of vision Ears, nose, mouth, throat, and face: Denies mucositis or sore throat Respiratory: Reports productive cough and shortness of breath. cardiovascular: Denies palpitation, chest discomfort Gastrointestinal: Reports abdominal pain in the center of his abdomen and in the left upper quadrant.  Reports mild nausea this morning but no vomiting. Skin: Denies abnormal skin rashes Lymphatics: Denies new lymphadenopathy.  Bruises easily. Neurological:Denies numbness, tingling or new weaknesses Behavioral/Psych: Mood is stable, no new changes  Extremities: Reports lower extremity edema which is new since prior admission. All other systems were reviewed with the patient and are negative.  I have reviewed the past medical history, past surgical history, social history and family history with the patient and they are unchanged from previous note.   PHYSICAL EXAMINATION: ECOG PERFORMANCE STATUS: 2 - Symptomatic, <50% confined to bed  Vitals:   08/07/18 0340 08/07/18 0800  BP: 103/61 94/68  Pulse: 84 95  Resp: 18 20  Temp: (!) 97.5 F (36.4 C) (!) 97.5 F (36.4 C)  SpO2: 99% 100%   Filed Weights   08/04/18 2125 08/05/18 2048 08/06/18 2126  Weight: 168 lb 14 oz (76.6 kg) 168 lb 6.9 oz (76.4 kg) 182 lb 4.8 oz (82.7 kg)    GENERAL:alert, no distress and comfortable SKIN: skin color, texture, turgor are  normal, no rashes or significant lesions. Multiple bruises noted over bilateral arms.  EYES: normal, Conjunctiva are pink and non-injected, sclera clear OROPHARYNX:no exudate, no erythema and lips, buccal mucosa, and tongue normal  NECK: supple, thyroid normal size, non-tender, without nodularity LYMPH:  no palpable lymphadenopathy in the cervical, axillary or inguinal LUNGS: Diminished breath sounds bilaterally HEART: regular rate & rhythm and no murmurs.  1+ bilateral lower extremity edema. Arms are edematous.  ABDOMEN: Normal bowel sounds, abdomen soft, diffuse tenderness with light palpation.  Splenomegaly noted. Musculoskeletal:no cyanosis of digits and no clubbing  NEURO: alert & oriented x 3 with fluent speech, no focal motor/sensory deficits  LABORATORY DATA:  I have reviewed the data as listed CMP Latest Ref Rng & Units 08/07/2018 08/06/2018 08/04/2018  Glucose 70 - 99 mg/dL 117(H) 98 90  BUN 8 - 23 mg/dL 39(H) 38(H) 59(H)  Creatinine 0.61 - 1.24 mg/dL 1.18 1.39(H) 1.68(H)  Sodium 135 - 145 mmol/L 137 138 137  Potassium 3.5 - 5.1 mmol/L 4.5 4.5 4.1  Chloride 98 - 111 mmol/L 115(H) 110 106  CO2 22 - 32 mmol/L 17(L) 18(L) 18(L)  Calcium 8.9 - 10.3 mg/dL 6.7(L) 7.0(L) 7.1(L)  Total Protein 6.5 - 8.1 g/dL - 4.0(L) 3.7(L)  Total Bilirubin 0.3 - 1.2 mg/dL - 3.3(H) 4.3(H)  Alkaline Phos 38 - 126 U/L - 188(H) 178(H)  AST 15 - 41 U/L - 63(H) 51(H)  ALT 0 - 44 U/L - 23 20    Lab Results  Component Value Date   WBC 0.9 (LL) 08/07/2018   HGB 9.7 (L) 08/07/2018   HCT 30.4 (L) 08/07/2018   MCV 85.4 08/07/2018  PLT 10 (LL) 08/07/2018   NEUTROABS 0.6 (L) 08/07/2018    ASSESSMENT AND PLAN: This is an 84 year old male with a 3 to 4-week history of abdominal pain who has pancytopenia and an enlarged spleen.  Imaging obtained during his last hospital admission is concerning for lymphoma versus another malignancy.  Recommendation has been made to perform a biopsy of the spleen, but this  cannot be done due to the risk of bleeding and because there are no focal splenic lesions.  CT scan of the chest was performed with contrast which shows a 14 mm pretracheal lymph node, 3 cm subcarinal lymph node, and 13 mm right pericardial node.  The patient was seen by pulmonology who recommended a PET scan and then would see the patient for consideration of EBUS with biopsy.  PET scan was scheduled for 08/11/2018 but the patient developed worsening abdominal discomfort, shortness of breath, fatigue, and weakness and has now been readmitted.  1.  Pancytopenia 2.  Splenomegaly 3.  Enlarged mediastinal lymph nodes 4.  Community-acquired pneumonia 5.  Acute renal failure 6.  Hypoalbuminemia 7.  Hyperuricemia  -Clinical suspicion of lymphoma and ITP: BM Bx: Early findings are not diagnostic of lymphoma (doesn't rule out lymphoma). Patient is slowly deteriorating and theres no way he can get discharged to get the PET and Bronch EBUS. Therefore after much discussion and thought, we are proceeding with first dose of Rituxan today. -BCR-ABL and flow cytometry pending -Continue dexamethasone 4 mg daily for severe thrombocytopenia which could help both lymphoma and thrombocytopenia. -Platelet count stable.  Status post 1 unit of platelets on 08/06/2018.  He has no active bleeding today.  Will hold off on a platelet transfusion.  Recommend platelet transfusion if actively bleeding. 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  Could be causing renal dysfunction.  Status post 1 dose of rasburicase 6 mg on 08/04/2018 with significant drop in uric acid level.  He is at risk for tumor lysis syndrome and he is currently on allopurinol.  Will monitor potassium, phosphorus, uric acid, renal function, and calcium closely. -Antibiotics per hospitalist team for pneumonia. Blood cultures remain negative to date.  -Goals of care: Prolongation of life and improvement of symptoms.  The patient does not wish to be intubated or  resuscitated if his heart were to stop.  Mikey Bussing, DNP, AGPCNP-BC, AOCNP   Attending Note  I personally saw the patient, reviewed the chart and examined the patient. The plan of care was discussed with the patient   -Clinical suspicion of ITP and lymphoma: Today is first dose of rituximab.  So far he has not had any infusion related reactions. -Thrombocytopenia: Platelet counts 10.  Yesterday he received platelet transfusion but has not had any change in the levels.  If tomorrow his platelet counts improve, we can continue to watch and monitor.  If however if it drops below 10 then he should get platelet transfusion. -Elevated bilirubin and renal dysfunction with lower extremity edema -Previous tumor lysis: Received rasburicase -Currently on allopurinol. He will be followed by hematology tomorrow.  I will see the patient back on Monday.

## 2018-08-07 NOTE — Progress Notes (Signed)
1000 Maxipime dose given at 1710 because of Rituxan dose infusing. Pharmacy notified to adjust dose. Will continue to monitor.

## 2018-08-07 NOTE — Progress Notes (Signed)
OT Cancellation Note  Patient Details Name: Zachary Bennett MRN: [id number] DOB: 05-18-1934   Cancelled Treatment:    Reason Eval/Treat Not Completed: Fatigue/lethargy limiting ability to participate. Checked on patient after lunch. Patient politely declined to participate in OT evaluation again. Reports that he's not finished receiving the new IV medication and he is not up for therapy today.  Will re-attempt OT evaluation this weekend as able.    Ailene Ravel, OTR/L,CBIS  [phone]  08/07/2018, 1:09 PM

## 2018-08-07 NOTE — Progress Notes (Signed)
Rituxan administration: On initial assessment, pt reported he had just vomited his breakfast. Breathing with accessory muscles. R 20. Infusion initiated at 0811. 1:1 monitoring due to pt's state. Erasmo Downer, NP in to see pt. Discussed maintaining infusion at low rate as there will not be 1:1 monitoring during entire infusion. OK, per Erasmo Downer, NP to run infusion at 33m/hr.  Teach back done with pt. He will contact nurse if he notices any changes. Report given to [place] RTherapist, sports  Continue vital signs Q30 minutes. Consult IV team when infusion is complete for disposal/ retrieval of hypersensitivity kit.

## 2018-08-07 NOTE — Progress Notes (Signed)
OT Cancellation Note  Patient Details Name: Zachary Bennett MRN: [id number] DOB: [birth year]   Cancelled Treatment:    Reason Eval/Treat Not Completed: Fatigue/lethargy limiting ability to participate. Patient declined OT evaluation this AM stating that he's not feeling up for it as he's starting some new IV medication and it's causing him to feel fatigued and weak. Would like OT to stop by later and see how he's feeling.  Will re-attempt OT evaluation at a later time if able.    Ailene Ravel, OTR/L,CBIS  [phone]  08/07/2018, 8:50 AM

## 2018-08-07 NOTE — Progress Notes (Signed)
PROGRESS NOTE  Zachary Bennett BWI:[id number] DOB: Jul 28, 1934 DOA: 08/03/2018 PCP: Nicholas Lose, MD   LOS: 3 days   Brief narrative: Zachary Bennett is a 84 y.o. male with medical history significant of thrombocytopenia and hyperlipidemia; who presented with complaints of abdominal pain and worsening shortness of breath.  Patient had just been admitted to the hospital from 4/29-5/2 after being found to have splenomegaly with pancytopenia with imaging concerning for lymphoma.  Patient was evaluated by Dr. Lindi Adie and ultimately the plan had been for an outpatient PET scan for targeted biopsy of a hypermetabolic site.  However, patient reported that the PET scan was not scheduled until the 19th of this month.  Since being home, he reported that he had decreased appetite, unable to eat much solid food as he reports that tastes like leather and has a difficult time swallowing it. He also complained of generalized weakness and ambulatory dysfunction with shortness of breath.     Upon admission into the emergency department, patient was noted to be afebrile, pulse 92-100, respirations 18-24, blood pressure 99/58-108/62, and O2 saturation maintained on room air. Patient was placed on 2 L nasal cannula oxygen for comfort.  Labs revealed WBC 1.9, platelets 18, sodium 133, CO2 16, BUN 73, creatinine 2.32, calcium 7.8, alkaline phosphatase 233, albumin 2.5, lipase 68, AST 66 , and total bilirubin 5.9.  Chest x-ray showed right lower lobe infiltrate concerning for possible pneumonia.  Patient was given 1 L normal saline IV fluids, vancomycin, and cefepime.  Patient was then considered for admission to the hospital.  Subjective: Patient reports fatigue/weakness.  Currently being infused with Rituxan this morning.  Continues with some abdominal distention.  Reports mild nonproductive cough.  No other complaints at this time. Denies headache, no fever/chills/night sweats, no nausea/vomiting/diarrhea, no chest pain, no  palpitations, no shortness of breath, no paresthesias.  No acute events overnight per nursing staff.  Assessment/Plan:  Principal Problem:   ARF (acute renal failure) (HCC) Active Problems:   Thrombocytopenia (HCC)   Elevated LFTs   Hyperbilirubinemia   Splenomegaly   CAP (community acquired pneumonia)   Dyspnea   Prolonged QT interval  Acute kidney injury: resolved Etiology likely secondary to volume depletion with poor oral intake prior to hospitalization. likely secondary to volume depletion from poor oral intake.  Also consideration of hyperuricemia. Creatinine on admission 2.32. --continue with IV fluid hydration.   --Creatinine improving 2.32-->1.68-->1.39-->1.18 (baseline 1.25 06/2018) --Avoid nephrotoxins, renally dose all medications --daily BMP  Dyspnea likely secondary to community-acquired pneumonia.   On Supplemental oxygen.  Will wean oxygen as able.  Chest x-ray showing signs of a ill-defined airspace opacity in the right medial lung base concerning for possible pneumonia.    Patient does have a large mediastinal lymphadenopathy as well.  Afebrile admission with a white blood cell count of 1.5. --COVID-19 negative --Urine Legionella antigen negative --MRSA PCR negative, discontinue vancomycin today  --Continue cefepime given the recent hospitalization, plan 7 day course to end 5/16 --Blood cultures no growth x 4 days --Titrate supplemental oxygen to maintain SPO2 greater than 92% --Incentive spirometry --Continue supportive care.   Hyperuricemia Uric acid level 10.9 on admission. --s/p rasburicase 6 mg IV on 08/04/2018; uric acid down to 1.2 --repeat uric acid in the am  Abdominal pain, secondary to splenomegaly:  Patient noted to have previous CT scan showing severe splenomegaly pretracheal lymph nodes and subcarinal lymph enlargement from 4/29. Oncology on board and had recommended PET scan which is scheduled for 5/19, to evaluate  most hypermetabolic area for  biopsy.   --status post IR bone marrow biopsy 5/14 which was negative for lymphoma but with myelodysplasia --Flow cytometry pending --Oncology d/w with general surgery regarding possible need for splenectomy, defer for now --continue oxycodone/fentanyl as needed for abdominal pain.   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 Lindi Adie following, appreciate assistance.  No active bleeding at this time.   --Platelet count down to 14-->10-->10, no signs of active bleeding --No lymphoma noted on bone marrow biopsy prelim report, but myelodysplasia --Continue dexamethasone 4 mg IV daily --Platelet transfusion on 08/06/2018, transfuse for platelets less than 10 or active bleeding --Rituxan infusion on 08/07/2018 for ITP unresponsive to steroids  Elevated bilirubin/elevated LFTs:  Bilirubin level has slightly improved.. Patient previously noted to have cholelithiasis without concerning symptoms for acute cholecystitis during last hospitalization. May warrant reimaging if symptoms persist  Prolonged QT interval:  QTc503 on admission.  --avoid QT prolonging medications  Hyperlipidemia. Continue to hold statin.  Tobacco abuse:  50-pack-year history.  Quit smoking 2 weeks back.  Emphasized to abstain from smoking.   VTE Prophylaxis: SCDs  Code Status: Full  Family Communication: none  Disposition Plan: Home likely in 2 to 3 days, once patient is clinically stable. Continue iv antibiotics and plan for Rituxan infusion by oncology on 08/07/2018.  Follow cultures.  PT evaluation.  Consultants:  Oncology  Interventional radiology  Procedures:  IR bone marrow biopsy 08/05/2018   Antibiotics: Anti-infectives (From admission, onward)   Start     Dose/Rate Route Frequency Ordered Stop   08/05/18 1600  vancomycin (VANCOCIN) 1,250 mg in sodium chloride 0.9 % 250 mL IVPB  Status:  Discontinued     1,250 mg 166.7 mL/hr over 90 Minutes Intravenous Every 48 hours 08/03/18 1449 08/05/18  1248   08/05/18 1000  ceFEPIme (MAXIPIME) 2 g in sodium chloride 0.9 % 100 mL IVPB     2 g 200 mL/hr over 30 Minutes Intravenous Every 12 hours 08/05/18 0852     08/04/18 1400  ceFEPIme (MAXIPIME) 2 g in sodium chloride 0.9 % 100 mL IVPB  Status:  Discontinued     2 g 200 mL/hr over 30 Minutes Intravenous Every 24 hours 08/03/18 1541 08/05/18 0852   08/03/18 1430  ceFEPIme (MAXIPIME) 2 g in sodium chloride 0.9 % 100 mL IVPB     2 g 200 mL/hr over 30 Minutes Intravenous  Once 08/03/18 1420 08/03/18 1512   08/03/18 1430  vancomycin (VANCOCIN) 1,500 mg in sodium chloride 0.9 % 500 mL IVPB     1,500 mg 250 mL/hr over 120 Minutes Intravenous  Once 08/03/18 1421 08/03/18 1744      Objective: Vitals:   08/07/18 1327 08/07/18 1407  BP: (!) 98/57 (!) 94/58  Pulse: (!) 104 95  Resp: (!) 24 (!) 22  Temp: (!) 97.5 F (36.4 C) 97.8 F (36.6 C)  SpO2: 99% 100%    Intake/Output Summary (Last 24 hours) at 08/07/2018 1416 Last data filed at 08/07/2018 1259 Gross per 24 hour  Intake 3120.21 ml  Output 525 ml  Net 2595.21 ml   Filed Weights   08/04/18 2125 08/05/18 2048 08/06/18 2126  Weight: 76.6 kg 76.4 kg 82.7 kg   Body mass index is 27.72 kg/m.   Physical Exam: GENERAL: Patient is alert awake and oriented.  On nasal cannula oxygen. HENT: No scleral pallor or icterus. Pupils equally reactive to light. Oral mucosa is moist NECK: is supple, no palpable thyroid enlargement. CHEST: Clear to auscultation.  No crackles or wheezes. Non tender on palpation. Diminished breath sounds bilaterally. CVS: S1 and S2 heard, no murmur. Regular rate and rhythm. No pericardial rub. ABDOMEN: Soft, bowel sounds are present.  Palpable spleen.  Diffuse tenderness of the abdomen. EXTREMITIES: Bilateral lower extremity edema. CNS: Cranial nerves are intact. No focal motor or sensory deficits. SKIN: warm and dry without rashes.  Data Review: I have personally reviewed the following laboratory data and  studies,  CBC: Recent Labs  Lab 08/03/18 1302 08/04/18 0333 08/05/18 0615 08/06/18 0449 08/07/18 0322  WBC 1.9* 1.5* 1.2* 1.0* 0.9*  NEUTROABS 1.4*  --   --  0.6* 0.6*  HGB 13.0 9.9* 10.5* 10.4* 9.7*  HCT 40.7 30.4* 32.9* 33.5* 30.4*  MCV 82.9 82.8 86.1 86.3 85.4  PLT 18* 14* 12* 10* 10*   Basic Metabolic Panel: Recent Labs  Lab 08/03/18 1302 08/04/18 0333 08/06/18 0449 08/07/18 0322  NA 133* 137 138 137  K 4.8 4.1 4.5 4.5  CL 101 106 110 115*  CO2 16* 18* 18* 17*  GLUCOSE 124* 90 98 117*  BUN 73* 59* 38* 39*  CREATININE 2.32* 1.68* 1.39* 1.18  CALCIUM 7.8* 7.1* 7.0* 6.7*   Liver Function Tests: Recent Labs  Lab 08/03/18 1302 08/04/18 0333 08/06/18 0449  AST 66* 51* 63*  ALT _0 ALKPHOS 233* 178* 188*  BILITOT 5.9* 4.3* 3.3*  PROT 5.1* 3.7* 4.0*  ALBUMIN 2.5* 1.9* 1.8*   Recent Labs  Lab 08/03/18 1302  LIPASE 68*   No results for input(s): AMMONIA in the last 168 hours. Cardiac Enzymes: Recent Labs  Lab 08/03/18 1302  TROPONINI <0.03   BNP (last 3 results) Recent Labs    08/03/18 1303  BNP 73.0    ProBNP (last 3 results) No results for input(s): PROBNP in the last 8760 hours.  CBG: No results for input(s): GLUCAP in the last 168 hours. Recent Results (from the past 240 hour(s))  SARS Coronavirus 2 (CEPHEID- Performed in [facility] hospital lab), Hosp Order     Status: None   Collection Time: 08/03/18  2:45 PM  Result Value Ref Range Status   SARS Coronavirus 2 NEGATIVE NEGATIVE Final    Comment: (NOTE) If result is NEGATIVE SARS-CoV-2 target nucleic acids are NOT DETECTED. The SARS-CoV-2 RNA is generally detectable in upper and lower  respiratory specimens during the acute phase of infection. The lowest  concentration of SARS-CoV-2 viral copies this assay can detect is 250  copies / mL. A negative result does not preclude SARS-CoV-2 infection  and should not be used as the sole basis for treatment or other  patient management  decisions.  A negative result may occur with  improper specimen collection / handling, submission of specimen other  than nasopharyngeal swab, presence of viral mutation(s) within the  areas targeted by this assay, and inadequate number of viral copies  (<250 copies / mL). A negative result must be combined with clinical  observations, patient history, and epidemiological information. If result is POSITIVE SARS-CoV-2 target nucleic acids are DETECTED. The SARS-CoV-2 RNA is generally detectable in upper and lower  respiratory specimens dur ing the acute phase of infection.  Positive  results are indicative of active infection with SARS-CoV-2.  Clinical  correlation with patient history and other diagnostic information is  necessary to determine patient infection status.  Positive results do  not rule out bacterial infection or co-infection with other viruses. If result is PRESUMPTIVE POSTIVE SARS-CoV-2 nucleic acids MAY BE  PRESENT.   A presumptive positive result was obtained on the submitted specimen  and confirmed on repeat testing.  While 2019 novel coronavirus  (SARS-CoV-2) nucleic acids may be present in the submitted sample  additional confirmatory testing may be necessary for epidemiological  and / or clinical management purposes  to differentiate between  SARS-CoV-2 and other Sarbecovirus currently known to infect humans.  If clinically indicated additional testing with an alternate test  methodology [phone]) is advised. The SARS-CoV-2 RNA is generally  detectable in upper and lower respiratory sp ecimens during the acute  phase of infection. The expected result is Negative. Fact Sheet for Patients:  StrictlyIdeas.no Fact Sheet for Healthcare Providers: BankingDealers.co.za This test is not yet approved or cleared by the [place] FDA and has been authorized for detection and/or diagnosis of SARS-CoV-2 by FDA under an  Emergency Use Authorization (EUA).  This EUA will remain in effect (meaning this test can be used) for the duration of the COVID-19 declaration under Section 564(b)(1) of the Act, 21 U.S.C. section 360bbb-3(b)(1), unless the authorization is terminated or revoked sooner. Performed at [facility], [place] [address] [place], [place] [address]   Culture, blood (routine x 2) Call MD if unable to obtain prior to antibiotics being given     Status: None (Preliminary result)   Collection Time: 08/03/18  6:33 PM  Result Value Ref Range Status   Specimen Description BLOOD BLOOD LEFT FOREARM  Final   Special Requests   Final    BOTTLES DRAWN AEROBIC ONLY Blood Culture adequate volume   Culture   Final    NO GROWTH 4 DAYS Performed at [facility], [place] [address] [place], [place] [address]    Report Status PENDING  Incomplete  Culture, blood (routine x 2) Call MD if unable to obtain prior to antibiotics being given     Status: None (Preliminary result)   Collection Time: 08/03/18  6:33 PM  Result Value Ref Range Status   Specimen Description BLOOD LEFT ANTECUBITAL  Final   Special Requests   Final    BOTTLES DRAWN AEROBIC ONLY Blood Culture adequate volume   Culture   Final    NO GROWTH 4 DAYS Performed at [facility], [address], [place], [place] [address]    Report Status PENDING  Incomplete  MRSA PCR Screening     Status: None   Collection Time: 08/05/18 10:19 AM  Result Value Ref Range Status   MRSA by PCR NEGATIVE NEGATIVE Final    Comment:        The GeneXpert MRSA Assay (FDA approved for NASAL specimens only), is one component of a comprehensive MRSA colonization surveillance program. It is not intended to diagnose MRSA infection nor to guide or monitor treatment for MRSA infections. Performed at [facility], [place] [address] [place],  [address]      Studies: No results found.  Scheduled Meds: . sodium chloride   Intravenous Once  .  allopurinol  100 mg Oral BID  . dexamethasone  4 mg Intravenous Q24H  . docusate sodium  100 mg Oral BID  . guaiFENesin  600 mg Oral BID  . zolpidem  5 mg Oral QHS    Continuous Infusions: . sodium chloride 100 mL/hr at 08/06/18 2139  . ceFEPime (MAXIPIME) IV 2 g (08/06/18 2140)     Yostin Malacara J [place] DO  [facility] 08/07/2018

## 2018-08-07 NOTE — Care Management Important Message (Signed)
Important Message  Patient Details  Name: Zachary Bennett MRN: [id number] Date of Birth: April 21, 1934   Medicare Important Message Given:  Yes    Memory Argue 08/07/2018, 2:07 PM

## 2018-08-08 ENCOUNTER — Inpatient Hospital Stay (HOSPITAL_COMMUNITY): Payer: Medicare Other

## 2018-08-08 ENCOUNTER — Telehealth: Payer: Self-pay | Admitting: Hematology and Oncology

## 2018-08-08 DIAGNOSIS — R609 Edema, unspecified: Secondary | ICD-10-CM

## 2018-08-08 DIAGNOSIS — D61818 Other pancytopenia: Secondary | ICD-10-CM

## 2018-08-08 DIAGNOSIS — N179 Acute kidney failure, unspecified: Principal | ICD-10-CM

## 2018-08-08 DIAGNOSIS — R161 Splenomegaly, not elsewhere classified: Secondary | ICD-10-CM

## 2018-08-08 LAB — COMPREHENSIVE METABOLIC PANEL
ALT: 24 U/L (ref 0–44)
AST: 72 U/L — ABNORMAL HIGH (ref 15–41)
Albumin: 1.5 g/dL — ABNORMAL LOW (ref 3.5–5.0)
Alkaline Phosphatase: 183 U/L — ABNORMAL HIGH (ref 38–126)
Anion gap: 9 (ref 5–15)
BUN: 45 mg/dL — ABNORMAL HIGH (ref 8–23)
CO2: 17 mmol/L — ABNORMAL LOW (ref 22–32)
Calcium: 6.7 mg/dL — ABNORMAL LOW (ref 8.9–10.3)
Chloride: 112 mmol/L — ABNORMAL HIGH (ref 98–111)
Creatinine, Ser: 1.35 mg/dL — ABNORMAL HIGH (ref 0.61–1.24)
GFR calc Af Amer: 57 mL/min — ABNORMAL LOW (ref >60)
GFR calc non Af Amer: 49 mL/min — ABNORMAL LOW (ref >60)
Glucose, Bld: 103 mg/dL — ABNORMAL HIGH (ref 70–99)
Potassium: 4.9 mmol/L (ref 3.5–5.1)
Sodium: 138 mmol/L (ref 135–145)
Total Bilirubin: 3.3 mg/dL — ABNORMAL HIGH (ref 0.3–1.2)
Total Protein: 3.4 g/dL — ABNORMAL LOW (ref 6.5–8.1)

## 2018-08-08 LAB — CULTURE, BLOOD (ROUTINE X 2)
Culture: NO GROWTH
Culture: NO GROWTH
Special Requests: ADEQUATE
Special Requests: ADEQUATE

## 2018-08-08 LAB — CBC WITH DIFFERENTIAL/PLATELET
Abs Immature Granulocytes: 0.01 10*3/uL (ref 0.00–0.07)
Basophils Absolute: 0 10*3/uL (ref 0.0–0.1)
Basophils Relative: 0 %
Eosinophils Absolute: 0 10*3/uL (ref 0.0–0.5)
Eosinophils Relative: 0 %
HCT: 27.3 % — ABNORMAL LOW (ref 39.0–52.0)
Hemoglobin: 8.7 g/dL — ABNORMAL LOW (ref 13.0–17.0)
Immature Granulocytes: 2 %
Lymphocytes Relative: 13 %
Lymphs Abs: 0.1 10*3/uL — ABNORMAL LOW (ref 0.7–4.0)
MCH: 27.5 pg (ref 26.0–34.0)
MCHC: 31.9 g/dL (ref 30.0–36.0)
MCV: 86.4 fL (ref 80.0–100.0)
Monocytes Absolute: 0 10*3/uL — ABNORMAL LOW (ref 0.1–1.0)
Monocytes Relative: 7 %
Neutro Abs: 0.5 10*3/uL — ABNORMAL LOW (ref 1.7–7.7)
Neutrophils Relative %: 78 %
Platelets: <5 10*3/uL — CL (ref 150–400)
RBC: 3.16 MIL/uL — ABNORMAL LOW (ref 4.22–5.81)
RDW: 18.4 % — ABNORMAL HIGH (ref 11.5–15.5)
WBC: 0.6 10*3/uL — CL (ref 4.0–10.5)
nRBC: 0 % (ref 0.0–0.2)

## 2018-08-08 LAB — PHOSPHORUS: Phosphorus: 3.9 mg/dL (ref 2.5–4.6)

## 2018-08-08 LAB — URIC ACID: Uric Acid, Serum: 1.7 mg/dL — ABNORMAL LOW (ref 3.7–8.6)

## 2018-08-08 MED ORDER — FUROSEMIDE 10 MG/ML IJ SOLN
40.0000 mg | Freq: Once | INTRAMUSCULAR | Status: AC
  Administered 2018-08-08: 40 mg via INTRAVENOUS
  Filled 2018-08-08: qty 4

## 2018-08-08 MED ORDER — SODIUM CHLORIDE 0.9% IV SOLUTION
Freq: Once | INTRAVENOUS | Status: AC
  Administered 2018-08-14: 23:00:00 via INTRAVENOUS

## 2018-08-08 NOTE — Progress Notes (Signed)
PROGRESS NOTE  Zachary Bennett OIN:[id number] DOB: [birth year] DOA: 08/03/2018 PCP: Nicholas Lose, MD   LOS: 4 days   Brief narrative: Zachary Bennett is a 84 y.o. male with medical history significant of thrombocytopenia and hyperlipidemia; who presented with complaints of abdominal pain and worsening shortness of breath.  Patient had just been admitted to the hospital from 4/29-5/2 after being found to have splenomegaly with pancytopenia with imaging concerning for lymphoma.  Patient was evaluated by Dr. Lindi Adie and ultimately the plan had been for an outpatient PET scan for targeted biopsy of a hypermetabolic site.  However, patient reported that the PET scan was not scheduled until the 19th of this month.  Since being home, he reported that he had decreased appetite, unable to eat much solid food as he reports that tastes like leather and has a difficult time swallowing it. He also complained of generalized weakness and ambulatory dysfunction with shortness of breath.     Upon admission into the emergency department, patient was noted to be afebrile, pulse 92-100, respirations 18-24, blood pressure 99/58-108/62, and O2 saturation maintained on room air. Patient was placed on 2 L nasal cannula oxygen for comfort.  Labs revealed WBC 1.9, platelets 18, sodium 133, CO2 16, BUN 73, creatinine 2.32, calcium 7.8, alkaline phosphatase 233, albumin 2.5, lipase 68, AST 66 , and total bilirubin 5.9.  Chest x-ray showed right lower lobe infiltrate concerning for possible pneumonia.  Patient was given 1 L normal saline IV fluids, vancomycin, and cefepime.  Patient was then considered for admission to the hospital.  Subjective: Patient reports increased swelling to right upper extremity with bruising surrounding PICC line placement.  Also with bilateral lower extremity edema.  Continues with fatigue and weakness.  Received Rituxan infusion yesterday.  No other complaints at this time.  Platelet count noted to drop to  less than 5.  Will transfuse platelets today. No other complaints at this time. Denies headache, no fever/chills/night sweats, no nausea/vomiting/diarrhea, no chest pain, no palpitations, no shortness of breath, no paresthesias.  No acute events overnight per nursing staff.  Assessment/Plan:  Principal Problem:   ARF (acute renal failure) (HCC) Active Problems:   Thrombocytopenia (HCC)   Elevated LFTs   Hyperbilirubinemia   Splenomegaly   CAP (community acquired pneumonia)   Dyspnea   Prolonged QT interval  Acute kidney injury: resolved Etiology likely secondary to volume depletion with poor oral intake prior to hospitalization. likely secondary to volume depletion from poor oral intake.  Also consideration of hyperuricemia. Creatinine on admission 2.32. --continue with IV fluid hydration.   --Creatinine improving 2.32-->1.68-->1.39-->1.18 (baseline 1.25 06/2018) --Avoid nephrotoxins, renally dose all medications --daily BMP  Dyspnea likely secondary to community-acquired pneumonia.   On Supplemental oxygen.  Will wean oxygen as able.  Chest x-ray showing signs of a ill-defined airspace opacity in the right medial lung base concerning for possible pneumonia.    Patient does have a large mediastinal lymphadenopathy as well.  Afebrile admission with a white blood cell count of 1.5. --COVID-19 negative --Urine Legionella antigen negative --MRSA PCR negative, discontinue vancomycin today  --Received 7-day course of cefepime, discontinued today --Blood cultures no growth x 5 days --Titrate supplemental oxygen to maintain SPO2 greater than 92% --Incentive spirometry --Continue supportive care.   Hyperuricemia Uric acid level 10.9 on admission. --s/p rasburicase 6 mg IV on 08/04/2018; uric acid down to 1.2 --Continue to follow daily uric acid   Abdominal pain, secondary to splenomegaly:  Patient noted to have previous CT scan showing  severe splenomegaly pretracheal lymph nodes and  subcarinal lymph enlargement from 4/29. Oncology on board and had recommended PET scan which is scheduled for 5/19, to evaluate most hypermetabolic area for biopsy.   --status post IR bone marrow biopsy 5/14 which was negative for lymphoma but with myelodysplasia --Flow cytometry pending --Oncology d/w with general surgery regarding possible need for splenectomy, defer for now --Oncology to discuss pathology at tumor board next week --continue oxycodone/fentanyl as needed for abdominal pain.   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 Lindi Adie following, appreciate assistance.  No active bleeding at this time.   --Platelet count down to 14-->10-->10-->less than 5, no signs of active bleeding --No lymphoma noted on bone marrow biopsy prelim report, but myelodysplasia --Continue dexamethasone 4 mg IV daily --Platelet transfusion on 08/06/2018, repeat platelet transfusion today --transfuse for platelets less than 10 or active bleeding --Rituxan infusion on 08/07/2018 for ITP unresponsive to steroids  Elevated bilirubin/elevated LFTs:  Bilirubin level has slightly improved.. Patient previously noted to have cholelithiasis without concerning symptoms for acute cholecystitis during last hospitalization. May warrant reimaging if symptoms persist  Prolonged QT interval:  QTc503 on admission.  --avoid QT prolonging medications  Hyperlipidemia. Continue to hold statin.  Tobacco abuse:  50-pack-year history.  Quit smoking 2 weeks back.  Emphasized to abstain from smoking.   VTE Prophylaxis: SCDs  Code Status: Full  Family Communication: none  Disposition Plan: Home likely in 2 to 3 days, once patient is clinically stable. Follow cultures.  PT recommends home health PT.  Consultants:  Oncology  Interventional radiology  Procedures:  IR bone marrow biopsy 08/05/2018   Antibiotics: Anti-infectives (From admission, onward)   Start     Dose/Rate Route Frequency Ordered Stop    08/05/18 1600  vancomycin (VANCOCIN) 1,250 mg in sodium chloride 0.9 % 250 mL IVPB  Status:  Discontinued     1,250 mg 166.7 mL/hr over 90 Minutes Intravenous Every 48 hours 08/03/18 1449 08/05/18 1248   08/05/18 1000  ceFEPIme (MAXIPIME) 2 g in sodium chloride 0.9 % 100 mL IVPB  Status:  Discontinued     2 g 200 mL/hr over 30 Minutes Intravenous Every 12 hours 08/05/18 0852 08/08/18 1010   08/04/18 1400  ceFEPIme (MAXIPIME) 2 g in sodium chloride 0.9 % 100 mL IVPB  Status:  Discontinued     2 g 200 mL/hr over 30 Minutes Intravenous Every 24 hours 08/03/18 1541 08/05/18 0852   08/03/18 1430  ceFEPIme (MAXIPIME) 2 g in sodium chloride 0.9 % 100 mL IVPB     2 g 200 mL/hr over 30 Minutes Intravenous  Once 08/03/18 1420 08/03/18 1512   08/03/18 1430  vancomycin (VANCOCIN) 1,500 mg in sodium chloride 0.9 % 500 mL IVPB     1,500 mg 250 mL/hr over 120 Minutes Intravenous  Once 08/03/18 1421 08/03/18 1744      Objective: Vitals:   08/08/18 1244 08/08/18 1318  BP: 102/76 109/67  Pulse: 91 98  Resp: (!) 22 (!) 21  Temp: 97.7 F (36.5 C) 98.2 F (36.8 C)  SpO2: 100% 99%    Intake/Output Summary (Last 24 hours) at 08/08/2018 1512 Last data filed at 08/08/2018 0900 Gross per 24 hour  Intake 1863.63 ml  Output 850 ml  Net 1013.63 ml   Filed Weights   08/05/18 2048 08/06/18 2126 08/08/18 0338  Weight: 76.4 kg 82.7 kg 83.9 kg   Body mass index is 28.13 kg/m.   Physical Exam: GENERAL: Patient is alert awake and oriented.  On nasal cannula oxygen. HENT: No scleral pallor or icterus. Pupils equally reactive to light. Oral mucosa is moist NECK: is supple, no palpable thyroid enlargement. CHEST: Clear to auscultation. No crackles or wheezes. Non tender on palpation. Diminished breath sounds bilaterally. CVS: S1 and S2 heard, no murmur. Regular rate and rhythm. No pericardial rub. ABDOMEN: Soft, bowel sounds are present.  Palpable spleen.  Diffuse tenderness of the abdomen. EXTREMITIES:  Bilateral lower extremity edema. CNS: Cranial nerves are intact. No focal motor or sensory deficits. SKIN: warm and dry without rashes.  Data Review: I have personally reviewed the following laboratory data and studies,  CBC: Recent Labs  Lab 08/03/18 1302 08/04/18 0333 08/05/18 0615 08/06/18 0449 08/07/18 0322 08/08/18 0355  WBC 1.9* 1.5* 1.2* 1.0* 0.9* 0.6*  NEUTROABS 1.4*  --   --  0.6* 0.6* 0.5*  HGB 13.0 9.9* 10.5* 10.4* 9.7* 8.7*  HCT 40.7 30.4* 32.9* 33.5* 30.4* 27.3*  MCV 82.9 82.8 86.1 86.3 85.4 86.4  PLT 18* 14* 12* 10* 10* <5*   Basic Metabolic Panel: Recent Labs  Lab 08/03/18 1302 08/04/18 0333 08/06/18 0449 08/07/18 0322 08/08/18 0355  NA 133* 137 138 137 138  K 4.8 4.1 4.5 4.5 4.9  CL 101 106 110 115* 112*  CO2 16* 18* 18* 17* 17*  GLUCOSE 124* 90 98 117* 103*  BUN 73* 59* 38* 39* 45*  CREATININE 2.32* 1.68* 1.39* 1.18 1.35*  CALCIUM 7.8* 7.1* 7.0* 6.7* 6.7*  PHOS  --   --   --   --  3.9   Liver Function Tests: Recent Labs  Lab 08/03/18 1302 08/04/18 0333 08/06/18 0449 08/08/18 0355  AST 66* 51* 63* 72*  ALT 24 20 23 24   ALKPHOS 233* 178* 188* 183*  BILITOT 5.9* 4.3* 3.3* 3.3*  PROT 5.1* 3.7* 4.0* 3.4*  ALBUMIN 2.5* 1.9* 1.8* 1.5*   Recent Labs  Lab 08/03/18 1302  LIPASE 68*   No results for input(s): AMMONIA in the last 168 hours. Cardiac Enzymes: Recent Labs  Lab 08/03/18 1302  TROPONINI <0.03   BNP (last 3 results) Recent Labs    08/03/18 1303  BNP 73.0    ProBNP (last 3 results) No results for input(s): PROBNP in the last 8760 hours.  CBG: No results for input(s): GLUCAP in the last 168 hours. Recent Results (from the past 240 hour(s))  SARS Coronavirus 2 (CEPHEID- Performed in [place] hospital lab), Hosp Order     Status: None   Collection Time: 08/03/18  2:45 PM  Result Value Ref Range Status   SARS Coronavirus 2 NEGATIVE NEGATIVE Final    Comment: (NOTE) If result is NEGATIVE SARS-CoV-2 target nucleic acids  are NOT DETECTED. The SARS-CoV-2 RNA is generally detectable in upper and lower  respiratory specimens during the acute phase of infection. The lowest  concentration of SARS-CoV-2 viral copies this assay can detect is 250  copies / mL. A negative result does not preclude SARS-CoV-2 infection  and should not be used as the sole basis for treatment or other  patient management decisions.  A negative result may occur with  improper specimen collection / handling, submission of specimen other  than nasopharyngeal swab, presence of viral mutation(s) within the  areas targeted by this assay, and inadequate number of viral copies  (<250 copies / mL). A negative result must be combined with clinical  observations, patient history, and epidemiological information. If result is POSITIVE SARS-CoV-2 target nucleic acids are DETECTED. The SARS-CoV-2 RNA is  generally detectable in upper and lower  respiratory specimens dur ing the acute phase of infection.  Positive  results are indicative of active infection with SARS-CoV-2.  Clinical  correlation with patient history and other diagnostic information is  necessary to determine patient infection status.  Positive results do  not rule out bacterial infection or co-infection with other viruses. If result is PRESUMPTIVE POSTIVE SARS-CoV-2 nucleic acids MAY BE PRESENT.   A presumptive positive result was obtained on the submitted specimen  and confirmed on repeat testing.  While 2019 novel coronavirus  (SARS-CoV-2) nucleic acids may be present in the submitted sample  additional confirmatory testing may be necessary for epidemiological  and / or clinical management purposes  to differentiate between  SARS-CoV-2 and other Sarbecovirus currently known to infect humans.  If clinically indicated additional testing with an alternate test  methodology [phone]) is advised. The SARS-CoV-2 RNA is generally  detectable in upper and lower respiratory sp ecimens  during the acute  phase of infection. The expected result is Negative. Fact Sheet for Patients:  StrictlyIdeas.no Fact Sheet for Healthcare Providers: BankingDealers.co.za This test is not yet approved or cleared by the [place] FDA and has been authorized for detection and/or diagnosis of SARS-CoV-2 by FDA under an Emergency Use Authorization (EUA).  This EUA will remain in effect (meaning this test can be used) for the duration of the COVID-19 declaration under Section 564(b)(1) of the Act, 21 U.S.C. section 360bbb-3(b)(1), unless the authorization is terminated or revoked sooner. Performed at [facility], [place] [address] [place], [place] [address]   Culture, blood (routine x 2) Call MD if unable to obtain prior to antibiotics being given     Status: None   Collection Time: 08/03/18  6:33 PM  Result Value Ref Range Status   Specimen Description BLOOD BLOOD LEFT FOREARM  Final   Special Requests   Final    BOTTLES DRAWN AEROBIC ONLY Blood Culture adequate volume   Culture   Final    NO GROWTH 5 DAYS Performed at [facility], [place] [address] [place], [place] [address]    Report Status 08/08/2018 FINAL  Final  Culture, blood (routine x 2) Call MD if unable to obtain prior to antibiotics being given     Status: None   Collection Time: 08/03/18  6:33 PM  Result Value Ref Range Status   Specimen Description BLOOD LEFT ANTECUBITAL  Final   Special Requests   Final    BOTTLES DRAWN AEROBIC ONLY Blood Culture adequate volume   Culture   Final    NO GROWTH 5 DAYS Performed at [facility] [address], [place], [place] [address]    Report Status 08/08/2018 FINAL  Final  MRSA PCR Screening     Status: None   Collection Time: 08/05/18 10:19 AM  Result Value Ref Range Status   MRSA by PCR NEGATIVE NEGATIVE Final    Comment:        The GeneXpert MRSA Assay (FDA approved for NASAL specimens only), is one  component of a comprehensive MRSA colonization surveillance program. It is not intended to diagnose MRSA infection nor to guide or monitor treatment for MRSA infections. Performed at [facility], [place] [address] [place], [place] [address]      Studies: Vas [place] Upper Extremity Venous Duplex  Result Date: 08/08/2018 UPPER VENOUS STUDY  Indications: Swelling, and Erythema Performing Technologist: Maudry Mayhew MHA, RDMS, RVT, RDCS  Examination Guidelines:  A complete evaluation includes B-mode imaging, spectral Doppler, color Doppler, and power Doppler as needed of all accessible portions of each vessel. Bilateral testing is considered an integral part of a complete examination. Limited examinations for reoccurring indications may be performed as noted.  Right Findings: +----------+------------+---------+-----------+----------+-------+  RIGHT      Compressible Phasicity Spontaneous Properties Summary  +----------+------------+---------+-----------+----------+-------+  IJV            Full        Yes        Yes                         +----------+------------+---------+-----------+----------+-------+  Subclavian     Full        Yes        Yes                         +----------+------------+---------+-----------+----------+-------+  Axillary       Full        Yes        Yes                         +----------+------------+---------+-----------+----------+-------+  Brachial       Full        Yes        Yes                         +----------+------------+---------+-----------+----------+-------+  Radial         Full                                               +----------+------------+---------+-----------+----------+-------+  Ulnar          Full                                               +----------+------------+---------+-----------+----------+-------+  Cephalic       Full                                               +----------+------------+---------+-----------+----------+-------+  Basilic         Full                                               +----------+------------+---------+-----------+----------+-------+  Left Findings: +----------+------------+---------+-----------+----------+-------+  LEFT       Compressible Phasicity Spontaneous Properties Summary  +----------+------------+---------+-----------+----------+-------+  Subclavian                 Yes        Yes                         +----------+------------+---------+-----------+----------+-------+  Summary:  Right: No evidence of deep vein thrombosis in the upper extremity. No evidence of superficial vein thrombosis in the upper extremity.  Left: No evidence of thrombosis in the subclavian.  *  See table(s) above for measurements and observations.    Preliminary     Scheduled Meds:  sodium chloride   Intravenous Once   sodium chloride   Intravenous Once   allopurinol  100 mg Oral BID   dexamethasone  4 mg Intravenous Q24H   docusate sodium  100 mg Oral BID   guaiFENesin  600 mg Oral BID   zolpidem  5 mg Oral QHS    Continuous Infusions:    Zachary Bennett J [place] DO  [facility] 08/08/2018

## 2018-08-08 NOTE — Telephone Encounter (Signed)
I updated his son and reviewed the plan of care

## 2018-08-08 NOTE — Progress Notes (Signed)
Zachary Bennett   DOB:09-Feb-1935   GL#:[id number]    ASSESSMENT & PLAN:  Severe splenomegaly and intra-abdominal lymphadenopathy His bone marrow biopsy formal result is pending His case will be presented at the next hematology tumor board next week for further discussion He has received 1 dose of rituximab without infusion reaction He has no signs of tumor lysis syndrome today Continue supportive care  Severe pancytopenia He has received 1 unit of platelet transfusion today I recommend continue close monitoring of blood count.  He should receive 1 unit of platelet whenever he has signs of bleeding or platelet count less than 10,000 He does not need blood transfusion today I do not recommend prophylactic G-CSF support unless he have clinical signs of sepsis  Possible pneumonia on admission with abnormal chest x-ray He has been afebrile and has received broad-spectrum IV cefepime I recommend consideration to stop IV antibiotics or switch to ceftriaxone or other antibiotics due to bone marrow suppression from cefepime  Abnormal liver enzymes So far, his liver enzymes are stable.  Monitor closely  Severe protein calorie malnutrition Continue nutritional supplement as tolerated  Chronic kidney disease stage III His kidney function is stable since treatment.  Observe only for now.  Code Status Full  Goals of care Plan to treat his underlying bone marrow disorder and severe pancytopenia  Discharge planning Not ready to be discharged due to unresolved medical issues I will call his son for update I have discussed this case with primary service  All questions were answered. The patient knows to call the clinic with any problems, questions or concerns.   Heath Lark, MD 08/08/2018 10:11 AM  Subjective:  Patient is seen to follow-up on recent treatment with rituximab.  He denies infusion reaction.  He complained of right upper arm swelling, ultrasound pending.  He has PICC line placed  in the region for blood draw and infusion treatment.  He has received several days of IV antibiotics and has been afebrile.  He bruises easily.The patient denies any recent signs or symptoms of bleeding such as spontaneous epistaxis, hematuria or hematochezia. He denies night sweats since admission.  His appetite is fair.  He denies nausea.  He complained of abdominal fullness but not pain.  Objective:  Vitals:   08/08/18 0338 08/08/18 0905  BP: 100/62 105/63  Pulse: 77 90  Resp: 20 20  Temp: (!) 97.4 F (36.3 C) (!) 97.5 F (36.4 C)  SpO2: 100% 100%     Intake/Output Summary (Last 24 hours) at 08/08/2018 1011 Last data filed at 08/08/2018 0900 Gross per 24 hour  Intake 2041.29 ml  Output 850 ml  Net 1191.29 ml    GENERAL:alert, no distress and comfortable SKIN: Significant bruises are noted EYES: normal, Conjunctiva are pale and non-injected, sclera clear OROPHARYNX:no exudate, no erythema and lips, buccal mucosa, and tongue normal  NECK: supple, thyroid normal size, non-tender, without nodularity LYMPH:  no palpable lymphadenopathy in the cervical, axillary or inguinal LUNGS: clear to auscultation and percussion with normal breathing effort HEART: regular rate & rhythm and no murmurs.  Noted generalized anasarca.  Noted swelling in all limbs but more pronounced on the right upper arm near the PICC site area ABDOMEN:abdomen soft, non-tender and normal bowel sounds Musculoskeletal:no cyanosis of digits and no clubbing  NEURO: alert & oriented x 3 with fluent speech, no focal motor/sensory deficits   Labs:  Recent Labs    07/23/18 0303  08/04/18 0333 08/06/18 0449 08/07/18 0322 08/08/18 0355  NA 134*   < >  137 138 137 138  K 4.0   < > 4.1 4.5 4.5 4.9  CL 103   < > 106 110 115* 112*  CO2 24   < > 18* 18* 17* 17*  GLUCOSE 86   < > 90 98 117* 103*  BUN 20   < > 59* 38* 39* 45*  CREATININE 1.25*   < > 1.68* 1.39* 1.18 1.35*  CALCIUM 8.3*   < > 7.1* 7.0* 6.7* 6.7*  GFRNONAA  54*   < > 38* 47* 58* 49*  GFRAA >60   < > 43* 55* >60 57*  PROT 4.8*   < > 3.7* 4.0*  --  3.4*  ALBUMIN 2.8*   < > 1.9* 1.8*  --  1.5*  AST 69*   < > 51* 63*  --  72*  ALT 33   < > 20 23  --  24  ALKPHOS 225*   < > 178* 188*  --  183*  BILITOT 3.3*   < > 4.3* 3.3*  --  3.3*  BILIDIR 1.7*  --   --   --   --   --    < > = values in this interval not displayed.    Studies:  Dg Chest 1 View  Result Date: 08/03/2018 CLINICAL DATA:  Shortness of breath and chest pain EXAM: CHEST  1 VIEW COMPARISON:  Chest radiograph July 10, 2007 and chest CT July 23, 2018 FINDINGS: There is ill-defined airspace consolidation in the medial right base. Lung bases otherwise are clear. Heart size and pulmonary vascular normal. No adenopathy. There is aortic atherosclerosis. No bone lesions IMPRESSION: Ill-defined airspace opacity medial right base, concerning for pneumonia. Lungs elsewhere clear. Heart size normal. No adenopathy. Aortic Atherosclerosis (ICD10-I70.0). Electronically Signed   By: Lowella Grip III M.D.   On: 08/03/2018 13:48   Ct Chest W Contrast  Addendum Date: 07/24/2018   ADDENDUM REPORT: 07/24/2018 09:27 ADDENDUM: 13 mm right pericardial node also present. Electronically Signed   By: Nelson Chimes M.D.   On: 07/24/2018 09:27   Result Date: 07/24/2018 CLINICAL DATA:  Assess for thoracic malignancy. Abdominal pain. Splenomegaly and abdominal adenopathy. EXAM: CT CHEST WITH CONTRAST TECHNIQUE: Multidetector CT imaging of the chest was performed during intravenous contrast administration. CONTRAST:  34m OMNIPAQUE IOHEXOL 300 MG/ML  SOLN COMPARISON:  CT abdomen done yesterday. FINDINGS: Cardiovascular: Heart size is normal. The patient has coronary artery calcification. No pericardial effusion. Pulmonary arteries appear normal size. There is atherosclerosis of the aorta but no aneurysm or dissection. Mediastinum/Nodes: Pretracheal lymph node axial image 51 measuring 14 mm in diameter. Subcarinal  adenopathy axial image 79 measuring 3 cm in diameter. No sign of hilar nodes. No axillary adenopathy. Lungs/Pleura: Background centrilobular emphysema, upper lobe predominant. No pulmonary mass. Small pleural effusion on the right layering dependently with dependent atelectasis. Upper Abdomen: See results of abdominal scan yesterday. No interval change. Musculoskeletal: Negative IMPRESSION: Aortic atherosclerosis and coronary artery calcification. 14 mm pretracheal lymph node. 3 cm subcarinal lymph node. These could relate to the same pathologic process resulting in the abdominal adenopathy. There is no evidence of a primary neoplasm in the chest. Small right effusion layering dependently with mild right dependent atelectasis. Electronically Signed: By: MNelson ChimesM.D. On: 07/23/2018 21:40   Ct Abdomen Pelvis W Contrast  Addendum Date: 07/22/2018   ADDENDUM REPORT: 07/22/2018 16:10 ADDENDUM: The liver does not appear to be significantly abnormal, but given the presence of severe splenomegaly and porta  hepatis adenopathy, severe hepatocellular disease or early hepatic cirrhosis cannot be excluded. Correlation with liver function tests is recommended. Electronically Signed   By: Marijo Conception M.D.   On: 07/22/2018 16:10   Result Date: 07/22/2018 CLINICAL DATA:  Generalized abdominal pain. EXAM: CT ABDOMEN AND PELVIS WITH CONTRAST TECHNIQUE: Multidetector CT imaging of the abdomen and pelvis was performed using the standard protocol following bolus administration of intravenous contrast. CONTRAST:  126m OMNIPAQUE IOHEXOL 300 MG/ML  SOLN COMPARISON:  CT scan of December 30, 2008. FINDINGS: Lower chest: Minimal right pleural effusion is noted with adjacent subsegmental atelectasis. Hepatobiliary: Minimal cholelithiasis is noted with gallbladder wall thickening. No biliary dilatation is noted. The liver is unremarkable. Pancreas: Unremarkable. No pancreatic ductal dilatation or surrounding inflammatory changes.  Spleen: There is interval development of severe splenomegaly without focal abnormality. Adrenals/Urinary Tract: Adrenal glands are unremarkable. Kidneys are normal, without renal calculi, focal lesion, or hydronephrosis. Bladder is unremarkable. Stomach/Bowel: The stomach appears normal. There is no evidence of bowel obstruction or inflammation. The appendix is not clearly visualized. Vascular/Lymphatic: Atherosclerosis of abdominal aorta is noted. 2.4 cm fusiform right common iliac artery aneurysm is noted. Adenopathy is noted in the porta hepatis region as well as in the periaortic region. Porta hepatis adenopathy measures 4.7 x 2.6 cm. 12 mm right periaortic lymph node is noted. Reproductive: Stable severe enlargement of prostate gland is noted. Other: Bilateral inguinal hernias are noted which contain loops of small bowel, but does not result in obstruction or incarceration. Musculoskeletal: No acute or significant osseous findings. IMPRESSION: Minimal cholelithiasis is noted with gallbladder wall thickening. Possible cholecystitis cannot be excluded. HIDA scan may be performed for further evaluation. Severe splenomegaly is noted with significantly enlarged adenopathy in the porta hepatis and periaortic regions. This is concerning for lymphoma or other malignancy and clinical correlation is recommended. Stable severe enlargement of prostate gland is noted. Mild bilateral inguinal hernias are noted which contain loops of small bowel, but does not result in incarceration or obstruction. 2.4 cm fusiform right common iliac artery aneurysm. Aortic Atherosclerosis (ICD10-I70.0). Electronically Signed: By: JMarijo ConceptionM.D. On: 07/22/2018 15:51   [place]Abdomen Limited  Result Date: 07/22/2018 CLINICAL DATA:  Elevated liver function tests. EXAM: ULTRASOUND ABDOMEN LIMITED RIGHT UPPER QUADRANT COMPARISON:  None. FINDINGS: Gallbladder: Multiple small less than 1 cm gallstones are seen. The gallbladder is nondilated.  Diffuse gallbladder wall thickening is seen measuring up to 6 mm. No evidence of pericholecystic fluid. No sonographic Murphy sign noted by sonographer. Common bile duct: Diameter: 4 mm, within normal limits. Liver: No focal lesion identified. Within normal limits in parenchymal echogenicity. Portal vein is patent on color Doppler imaging with normal direction of blood flow towards the liver. IMPRESSION: Cholelithiasis. Nondilated gallbladder with diffuse wall thickening, 5 without pericholecystic fluid or sonographic Murphy's sign. These findings are nonspecific. Chronic cholecystitis cannot be excluded. No evidence of biliary ductal dilatation. Unremarkable sonographic appearance of liver. Electronically Signed   By: JEarle GellM.D.   On: 07/22/2018 20:29   Ct Bone Marrow Biopsy & Aspiration  Result Date: 08/05/2018 INDICATION: Pancytopenia and splenomegaly. Please perform CT-guided biopsy for tissue diagnostic purposes. EXAM: CT-GUIDED BONE MARROW BIOPSY AND ASPIRATION MEDICATIONS: None ANESTHESIA/SEDATION: Fentanyl 25 mcg IV; Versed 1 mg IV Sedation Time: 11 Minutes; The patient was continuously monitored during the procedure by the interventional radiology nurse under my direct supervision. COMPLICATIONS: None immediate. PROCEDURE: Informed consent was obtained from the patient following an explanation of the procedure, risks, benefits  and alternatives. The patient understands, agrees and consents for the procedure. All questions were addressed. A time out was performed prior to the initiation of the procedure. The patient was positioned prone and non-contrast localization CT was performed of the pelvis to demonstrate the iliac marrow spaces. The operative site was prepped and draped in the usual sterile fashion. Under sterile conditions and local anesthesia, a 22 gauge spinal needle was utilized for procedural planning. Next, an 11 gauge coaxial bone biopsy needle was advanced into the left iliac marrow  space. Needle position was confirmed with CT imaging. Initially, bone marrow aspiration was performed. Next, a bone marrow biopsy was obtained with the 11 gauge outer bone marrow device. Samples were prepared with the cytotechnologist and deemed adequate. The needle was removed intact. Hemostasis was obtained with compression and a dressing was placed. The patient tolerated the procedure well without immediate post procedural complication. IMPRESSION: Successful CT guided left iliac bone marrow aspiration and core biopsy. Electronically Signed   By: Sandi Mariscal M.D.   On: 08/05/2018 11:46

## 2018-08-08 NOTE — Progress Notes (Signed)
Right upper extremity venous duplex completed. Refer to "CV Proc" under chart review to view preliminary results.  08/08/2018 11:48 AM Maudry Mayhew, MHA, RVT, RDCS, RDMS

## 2018-08-08 NOTE — Progress Notes (Signed)
RN asked IV team to look at midline. Arm is red and swollen below the site of the midline. Midline flushes well and has good blood return.

## 2018-08-08 NOTE — Progress Notes (Signed)
This RN assessed the midline site. Noticed redness and swollen surrounding area. Stopped IV infusion. Asked IV team nurse to look and  assess midline site. Will continue to monitor

## 2018-08-08 NOTE — Progress Notes (Signed)
Occupational Therapy Evaluation Patient Details Name: Zachary Bennett MRN: [id number] DOB: [birth year] Today's Date: 08/08/2018    History of Present Illness 84 y.o. male admitted with generalized weakness SOB, found to have CAP and AKI, abdominal pain secondary to splenomegaly. Pt currently awaiting PET scan for prior concern of lymphoma.    Clinical Impression   PTA, pt was living at home with his wife and son, pt reports he was independent with ADL/IADL and functional mobility. Pt reports he enjoys fishing. Upon arrival pt reclined in bed, despite education on importance of OOB mobility, pt requested to keep session to bed level activity stating he was tired. Pt appears to have self-limiting behavior this session, encouraged and educated pt about activity progression. This session focused on UE HEP and pt demonstrated good carryover of LE HEP from PT session. Pt verbalized great understanding of medical history. Due to decline in current level of function, pt would benefit from acute OT to address established goals to facilitate safe D/C to venue listed below. At this time, recommend HHOT and 24/7 physical support follow-up. Will continue to follow acutely.     Follow Up Recommendations  Home health OT;Supervision/Assistance - 24 hour (per PT son will provide physical support)   Equipment Recommendations  3 in 1 bedside commode    Recommendations for Other Services PT consult     Precautions / Restrictions Precautions Precautions: Fall Restrictions Weight Bearing Restrictions: No      Mobility Bed Mobility Overal bed mobility: Needs Assistance             General bed mobility comments: pt able to pull himself to head of bed with overhead reach;pt states he is weak and not wanting to do bed mobility;agreeable to bed level activity and exercise  Transfers                 General transfer comment: pt declined    Balance                                            ADL either performed or assessed with clinical judgement   ADL Overall ADL's : Needs assistance/impaired Eating/Feeding: Set up;Sitting   Grooming: Set up;Sitting   Upper Body Bathing: Set up;Sitting   Lower Body Bathing: Minimal assistance;Sit to/from stand   Upper Body Dressing : Minimal assistance;Sitting   Lower Body Dressing: Minimal assistance                 General ADL Comments: pt with self-limiting behaviors this session limiting his participation and request for bed level activity;pt educated on current ability and educated on importance of OOB mobility and participation with OT/PT;pt with DoE 3/4 with activity, SpO2 98% on 2lnc      Vision Baseline Vision/History: Wears glasses Wears Glasses: Reading only Patient Visual Report: No change from baseline       Perception     Praxis      Pertinent Vitals/Pain Pain Assessment: No/denies pain Pain Intervention(s): Monitored during session     Hand Dominance Right   Extremity/Trunk Assessment Upper Extremity Assessment Upper Extremity Assessment: Generalized weakness   Lower Extremity Assessment Lower Extremity Assessment: Defer to PT evaluation       Communication Communication Communication: No difficulties   Cognition Arousal/Alertness: Awake/alert Behavior During Therapy: WFL for tasks assessed/performed Overall Cognitive Status: Within Functional Limits for tasks assessed  General Comments  completed general UE and LE exercises;pt demonstrated good carryover from PT session demonstrating LE exercises    Exercises Exercises: General Lower Extremity;General Upper Extremity General Exercises - Upper Extremity Shoulder Flexion: 10 reps;Both Shoulder Extension: 10 reps;Both Elbow Flexion: 10 reps;Both Elbow Extension: Both;10 reps General Exercises - Lower Extremity Ankle Circles/Pumps: 10 reps;Both Heel Slides: 10  reps;Both Straight Leg Raises: 5 reps;Both   Shoulder Instructions      Home Living Family/patient expects to be discharged to:: Private residence Living Arrangements: Spouse/significant other;Children Available Help at Discharge: Family;Available 24 hours/day Type of Home: House Home Access: Level entry     Home Layout: One level     Bathroom Shower/Tub: Tub/shower unit;Walk-in shower   Bathroom Toilet: Standard Bathroom Accessibility: Yes How Accessible: Accessible via walker Home Equipment: [place] - 2 wheels;Bedside commode;Shower seat;Wheelchair - manual;Grab bars - toilet;Grab bars - tub/shower          Prior Functioning/Environment Level of Independence: Independent                 OT Problem List: Decreased strength;Decreased range of motion;Decreased activity tolerance;Impaired balance (sitting and/or standing);Decreased safety awareness;Pain      OT Treatment/Interventions: Self-care/ADL training;Therapeutic exercise;Energy conservation;Therapeutic activities;Balance training;Patient/family education;DME and/or AE instruction    OT Goals(Current goals can be found in the care plan section) Acute Rehab OT Goals Patient Stated Goal: go home with family OT Goal Formulation: With patient Time For Goal Achievement: 08/22/18 Potential to Achieve Goals: Good ADL Goals Pt Will Perform Grooming: Independently Pt Will Perform Lower Body Dressing: Independently Pt Will Transfer to Toilet: Independently;ambulating Pt Will Perform Tub/Shower Transfer: Independently  OT Frequency: Min 2X/week   Barriers to D/C:            Co-evaluation              AM-PAC OT "6 Clicks" Daily Activity     Outcome Measure Help from another person eating meals?: None Help from another person taking care of personal grooming?: A Little Help from another person toileting, which includes using toliet, bedpan, or urinal?: A Little Help from another person bathing (including  washing, rinsing, drying)?: A Little Help from another person to put on and taking off regular upper body clothing?: A Little Help from another person to put on and taking off regular lower body clothing?: A Little 6 Click Score: 19   End of Session Equipment Utilized During Treatment: Oxygen Nurse Communication: Mobility status  Activity Tolerance: Patient limited by fatigue Patient left: in bed;with call bell/phone within reach;with bed alarm set  OT Visit Diagnosis: Unsteadiness on feet (R26.81);Other abnormalities of gait and mobility (R26.89);Muscle weakness (generalized) (M62.81)                Time: 3491-7915 OT Time Calculation (min): 27 min Charges:  OT General Charges $OT Visit: 1 Visit OT Evaluation $OT Eval Moderate Complexity: 1 Mod OT Treatments $Self Care/Home Management : 8-22 mins  Dorinda Hill OTR/L Acute Rehabilitation Services Office: [place] 08/08/2018, 3:11 PM

## 2018-08-09 LAB — CBC WITH DIFFERENTIAL/PLATELET
Abs Immature Granulocytes: 0 K/uL (ref 0.00–0.07)
Basophils Absolute: 0 K/uL (ref 0.0–0.1)
Basophils Relative: 0 %
Eosinophils Absolute: 0 K/uL (ref 0.0–0.5)
Eosinophils Relative: 0 %
HCT: 27.3 % — ABNORMAL LOW (ref 39.0–52.0)
Hemoglobin: 8.7 g/dL — ABNORMAL LOW (ref 13.0–17.0)
Immature Granulocytes: 0 %
Lymphocytes Relative: 16 %
Lymphs Abs: 0.1 K/uL — ABNORMAL LOW (ref 0.7–4.0)
MCH: 26.7 pg (ref 26.0–34.0)
MCHC: 31.9 g/dL (ref 30.0–36.0)
MCV: 83.7 fL (ref 80.0–100.0)
Monocytes Absolute: 0 K/uL — ABNORMAL LOW (ref 0.1–1.0)
Monocytes Relative: 5 %
Neutro Abs: 0.4 K/uL — ABNORMAL LOW (ref 1.7–7.7)
Neutrophils Relative %: 79 %
Platelets: 8 K/uL — CL (ref 150–400)
RBC: 3.26 MIL/uL — ABNORMAL LOW (ref 4.22–5.81)
RDW: 18.7 % — ABNORMAL HIGH (ref 11.5–15.5)
WBC: 0.4 K/uL — CL (ref 4.0–10.5)
nRBC: 0 % (ref 0.0–0.2)

## 2018-08-09 LAB — BASIC METABOLIC PANEL
Anion gap: 7 (ref 5–15)
BUN: 49 mg/dL — ABNORMAL HIGH (ref 8–23)
CO2: 17 mmol/L — ABNORMAL LOW (ref 22–32)
Calcium: 7 mg/dL — ABNORMAL LOW (ref 8.9–10.3)
Chloride: 114 mmol/L — ABNORMAL HIGH (ref 98–111)
Creatinine, Ser: 1.35 mg/dL — ABNORMAL HIGH (ref 0.61–1.24)
GFR calc Af Amer: 57 mL/min — ABNORMAL LOW (ref >60)
GFR calc non Af Amer: 49 mL/min — ABNORMAL LOW (ref >60)
Glucose, Bld: 144 mg/dL — ABNORMAL HIGH (ref 70–99)
Potassium: 4.9 mmol/L (ref 3.5–5.1)
Sodium: 138 mmol/L (ref 135–145)

## 2018-08-09 LAB — HEPATIC FUNCTION PANEL
ALT: 21 U/L (ref 0–44)
AST: 47 U/L — ABNORMAL HIGH (ref 15–41)
Albumin: 1.6 g/dL — ABNORMAL LOW (ref 3.5–5.0)
Alkaline Phosphatase: 164 U/L — ABNORMAL HIGH (ref 38–126)
Bilirubin, Direct: 0.9 mg/dL — ABNORMAL HIGH (ref 0.0–0.2)
Indirect Bilirubin: 1.2 mg/dL — ABNORMAL HIGH (ref 0.3–0.9)
Total Bilirubin: 2.1 mg/dL — ABNORMAL HIGH (ref 0.3–1.2)
Total Protein: 3.8 g/dL — ABNORMAL LOW (ref 6.5–8.1)

## 2018-08-09 LAB — BPAM PLATELET PHERESIS
Blood Product Expiration Date: 202005172359
ISSUE DATE / TIME: 202005161255
Unit Type and Rh: 6200

## 2018-08-09 LAB — PREPARE PLATELET PHERESIS: Unit division: 0

## 2018-08-09 MED ORDER — FUROSEMIDE 10 MG/ML IJ SOLN
40.0000 mg | Freq: Once | INTRAMUSCULAR | Status: AC
  Administered 2018-08-09: 40 mg via INTRAVENOUS
  Filled 2018-08-09: qty 4

## 2018-08-09 MED ORDER — SODIUM CHLORIDE 0.9% IV SOLUTION
Freq: Once | INTRAVENOUS | Status: AC
  Administered 2018-08-09: 11:00:00 via INTRAVENOUS

## 2018-08-09 NOTE — Progress Notes (Signed)
PROGRESS NOTE  Zachary Bennett SUP:[id number] DOB: 07/04/1934 DOA: 08/03/2018 PCP: Nicholas Lose, MD   LOS: 5 days   Brief narrative: Zachary Bennett is a 84 y.o. male with medical history significant of thrombocytopenia and hyperlipidemia; who presented with complaints of abdominal pain and worsening shortness of breath.  Patient had just been admitted to the hospital from 4/29-5/2 after being found to have splenomegaly with pancytopenia with imaging concerning for lymphoma.  Patient was evaluated by Dr. Lindi Adie and ultimately the plan had been for an outpatient PET scan for targeted biopsy of a hypermetabolic site.  However, patient reported that the PET scan was not scheduled until the 19th of this month.  Since being home, he reported that he had decreased appetite, unable to eat much solid food as he reports that tastes like leather and has a difficult time swallowing it. He also complained of generalized weakness and ambulatory dysfunction with shortness of breath.     Upon admission into the emergency department, patient was noted to be afebrile, pulse 92-100, respirations 18-24, blood pressure 99/58-108/62, and O2 saturation maintained on room air. Patient was placed on 2 L nasal cannula oxygen for comfort.  Labs revealed WBC 1.9, platelets 18, sodium 133, CO2 16, BUN 73, creatinine 2.32, calcium 7.8, alkaline phosphatase 233, albumin 2.5, lipase 68, AST 66 , and total bilirubin 5.9.  Chest x-ray showed right lower lobe infiltrate concerning for possible pneumonia.  Patient was given 1 L normal saline IV fluids, vancomycin, and cefepime.  Patient was then considered for admission to the hospital.  Subjective: Patient reports good sleep overnight.  No significant complaints this morning.  Platelet count up to 8.  Will transfuse platelets once again today. Denies headache, no fever/chills/night sweats, no nausea/vomiting/diarrhea, no chest pain, no palpitations, no shortness of breath, no  paresthesias.  No acute events overnight per nursing staff.  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 Lindi Adie following, appreciate assistance.  No active bleeding at this time.   --Platelet count down to 14-->10-->10-->less than 5, no signs of active bleeding --No lymphoma noted on bone marrow biopsy prelim report, but myelodysplasia --Pending flow cytometry --Continue dexamethasone 4 mg IV daily --Rituxan infusion on 08/07/2018 for ITP unresponsive to steroids --Platelet transfusion on 5/14 and 5/16; repeat platelet transfusion today --transfuse for platelets less than 10 or active bleeding --Medical oncology plans to present case at tumor board this coming week  Hyperuricemia Uric acid level 10.9 on admission. --s/p rasburicase 6 mg IV on 08/04/2018; uric acid down to 1.2 --Continue to follow daily uric acid   Abdominal pain, secondary to splenomegaly:  Patient noted to have previous CT scan showing severe splenomegaly pretracheal lymph nodes and subcarinal lymph enlargement from 4/29. Oncology on board and had recommended PET scan which is scheduled for 5/19, to evaluate most hypermetabolic area for biopsy.   --status post IR bone marrow biopsy 5/14 which was negative for lymphoma but with myelodysplasia --Flow cytometry pending --Oncology d/w with general surgery regarding possible need for splenectomy, defer for now --Oncology to discuss pathology at tumor board next week --continue oxycodone/fentanyl as needed for abdominal pain.   Acute kidney injury: resolved Etiology likely secondary to volume depletion with poor oral intake prior to hospitalization. likely secondary to volume  depletion from poor oral intake.  Also  consideration of hyperuricemia. Creatinine on admission 2.32. --continue with IV fluid hydration.   --Creatinine improving 2.32-->1.68-->1.39-->1.18-->1.35 (baseline 1.25 06/2018) --Avoid nephrotoxins, renally dose all medications --daily BMP  Dyspnea likely secondary to community-acquired pneumonia.   On Supplemental oxygen.  Will wean oxygen as able.  Chest x-ray showing signs of a ill-defined airspace opacity in the right medial lung base concerning for possible pneumonia.    Patient does have a large mediastinal lymphadenopathy as well.  Afebrile admission with a white blood cell count of 1.5. --COVID-19 negative --Urine Legionella antigen negative --MRSA PCR negative, discontinue vancomycin today  --s/p 7-day course of cefepime --Blood cultures no growth x 5 days --Titrate supplemental oxygen to maintain SPO2 greater than 92% --Incentive spirometry --Continue supportive care.   Elevated bilirubin/elevated LFTs:  Bilirubin level has slightly improved.. Patient previously noted to have cholelithiasis without concerning symptoms for acute cholecystitis during last hospitalization. May warrant reimaging if symptoms persist  Bilateral lower extremity edema, right upper extremity edema Ultrasound right upper extremity negative for DVT. --Will give another dose of Lasix 40 mg IV today --Encourage mobilization  Prolonged QT interval:  QTc503 on admission.  --avoid QT prolonging medications  Hyperlipidemia. Continue to hold statin.  Tobacco abuse:  50-pack-year history.  Quit smoking 2 weeks back.  Emphasized to abstain from smoking.   VTE Prophylaxis: SCDs  Code Status: Full  Family Communication: none  Disposition Plan: continue inpatient hospitalization, pending flow cytometry, no further recommendations from medical oncology.  PT recommends home health PT.  Consultants:  Oncology  Interventional radiology  Procedures:  IR bone marrow biopsy 08/05/2018    Antibiotics: Anti-infectives (From admission, onward)   Start     Dose/Rate Route Frequency Ordered Stop   08/05/18 1600  vancomycin (VANCOCIN) 1,250 mg in sodium chloride 0.9 % 250 mL IVPB  Status:  Discontinued     1,250 mg 166.7 mL/hr over 90 Minutes Intravenous Every 48 hours 08/03/18 1449 08/05/18 1248   08/05/18 1000  ceFEPIme (MAXIPIME) 2 g in sodium chloride 0.9 % 100 mL IVPB  Status:  Discontinued     2 g 200 mL/hr over 30 Minutes Intravenous Every 12 hours 08/05/18 0852 08/08/18 1010   08/04/18 1400  ceFEPIme (MAXIPIME) 2 g in sodium chloride 0.9 % 100 mL IVPB  Status:  Discontinued     2 g 200 mL/hr over 30 Minutes Intravenous Every 24 hours 08/03/18 1541 08/05/18 0852   08/03/18 1430  ceFEPIme (MAXIPIME) 2 g in sodium chloride 0.9 % 100 mL IVPB     2 g 200 mL/hr over 30 Minutes Intravenous  Once 08/03/18 1420 08/03/18 1512   08/03/18 1430  vancomycin (VANCOCIN) 1,500 mg in sodium chloride 0.9 % 500 mL IVPB     1,500 mg 250 mL/hr over 120 Minutes Intravenous  Once 08/03/18 1421 08/03/18 1744      Objective: Vitals:   08/09/18 1125 08/09/18 1251  BP: 116/74 103/66  Pulse: 92 91  Resp: 20 20  Temp: 97.9 F (36.6 C) 97.9 F (36.6 C)  SpO2: 100% 100%    Intake/Output Summary (Last 24 hours) at 08/09/2018 1406 Last data filed at 08/09/2018 0920 Gross per 24 hour  Intake 240 ml  Output 1775 ml  Net -1535 ml   Filed Weights   08/06/18 2126 08/08/18 0338 08/08/18 2054  Weight: 82.7 kg 83.9 kg 83.8 kg   Body mass index is 28.09 kg/m.   Physical Exam: GENERAL: Patient is alert  awake and oriented.  On nasal cannula oxygen. HENT: No scleral pallor or icterus. Pupils equally reactive to light. Oral mucosa is moist NECK: is supple, no palpable thyroid enlargement. CHEST: Clear to auscultation. No crackles or wheezes. Non tender on palpation. Diminished breath sounds bilaterally. CVS: S1 and S2 heard, no murmur. Regular rate and rhythm. No pericardial rub. ABDOMEN:  Soft, bowel sounds are present.  Palpable spleen.  Diffuse tenderness of the abdomen. EXTREMITIES: Bilateral lower extremity edema. CNS: Cranial nerves are intact. No focal motor or sensory deficits. SKIN: bruising and edema noted RUE below PICC site  Data Review: I have personally reviewed the following laboratory data and studies,  CBC: Recent Labs  Lab 08/03/18 1302  08/05/18 0615 08/06/18 0449 08/07/18 0322 08/08/18 0355 08/09/18 0333  WBC 1.9*   < > 1.2* 1.0* 0.9* 0.6* 0.4*  NEUTROABS 1.4*  --   --  0.6* 0.6* 0.5* 0.4*  HGB 13.0   < > 10.5* 10.4* 9.7* 8.7* 8.7*  HCT 40.7   < > 32.9* 33.5* 30.4* 27.3* 27.3*  MCV 82.9   < > 86.1 86.3 85.4 86.4 83.7  PLT 18*   < > 12* 10* 10* <5* 8*   < > = values in this interval not displayed.   Basic Metabolic Panel: Recent Labs  Lab 08/04/18 0333 08/06/18 0449 08/07/18 0322 08/08/18 0355 08/09/18 0333  NA 137 138 137 138 138  K 4.1 4.5 4.5 4.9 4.9  CL 106 110 115* 112* 114*  CO2 18* 18* 17* 17* 17*  GLUCOSE 90 98 117* 103* 144*  BUN 59* 38* 39* 45* 49*  CREATININE 1.68* 1.39* 1.18 1.35* 1.35*  CALCIUM 7.1* 7.0* 6.7* 6.7* 7.0*  PHOS  --   --   --  3.9  --    Liver Function Tests: Recent Labs  Lab 08/03/18 1302 08/04/18 0333 08/06/18 0449 08/08/18 0355 08/09/18 0333  AST 66* 51* 63* 72* 47*  ALT 24 20 23 24 21   ALKPHOS 233* 178* 188* 183* 164*  BILITOT 5.9* 4.3* 3.3* 3.3* 2.1*  PROT 5.1* 3.7* 4.0* 3.4* 3.8*  ALBUMIN 2.5* 1.9* 1.8* 1.5* 1.6*   Recent Labs  Lab 08/03/18 1302  LIPASE 68*   No results for input(s): AMMONIA in the last 168 hours. Cardiac Enzymes: Recent Labs  Lab 08/03/18 1302  TROPONINI <0.03   BNP (last 3 results) Recent Labs    08/03/18 1303  BNP 73.0    ProBNP (last 3 results) No results for input(s): PROBNP in the last 8760 hours.  CBG: No results for input(s): GLUCAP in the last 168 hours. Recent Results (from the past 240 hour(s))  SARS Coronavirus 2 (CEPHEID- Performed in [facility] hospital lab), Hosp Order     Status: None   Collection Time: 08/03/18  2:45 PM  Result Value Ref Range Status   SARS Coronavirus 2 NEGATIVE NEGATIVE Final    Comment: (NOTE) If result is NEGATIVE SARS-CoV-2 target nucleic acids are NOT DETECTED. The SARS-CoV-2 RNA is generally detectable in upper and lower  respiratory specimens during the acute phase of infection. The lowest  concentration of SARS-CoV-2 viral copies this assay can detect is 250  copies / mL. A negative result does not preclude SARS-CoV-2 infection  and should not be used as the sole basis for treatment or other  patient management decisions.  A negative result may occur with  improper specimen collection / handling, submission of specimen other  than nasopharyngeal swab, presence of viral mutation(s)  within the  areas targeted by this assay, and inadequate number of viral copies  (<250 copies / mL). A negative result must be combined with clinical  observations, patient history, and epidemiological information. If result is POSITIVE SARS-CoV-2 target nucleic acids are DETECTED. The SARS-CoV-2 RNA is generally detectable in upper and lower  respiratory specimens dur ing the acute phase of infection.  Positive  results are indicative of active infection with SARS-CoV-2.  Clinical  correlation with patient history and other diagnostic information is  necessary to determine patient infection status.  Positive results do  not rule out bacterial infection or co-infection with other viruses. If result is PRESUMPTIVE POSTIVE SARS-CoV-2 nucleic acids MAY BE PRESENT.   A presumptive positive result was obtained on the submitted specimen  and confirmed on repeat testing.  While 2019 novel coronavirus  (SARS-CoV-2) nucleic acids may be present in the submitted sample  additional confirmatory testing may be necessary for epidemiological  and / or clinical management purposes  to differentiate between  SARS-CoV-2 and  other Sarbecovirus currently known to infect humans.  If clinically indicated additional testing with an alternate test  methodology [phone]) is advised. The SARS-CoV-2 RNA is generally  detectable in upper and lower respiratory sp ecimens during the acute  phase of infection. The expected result is Negative. Fact Sheet for Patients:  StrictlyIdeas.no Fact Sheet for Healthcare Providers: BankingDealers.co.za This test is not yet approved or cleared by the [place] FDA and has been authorized for detection and/or diagnosis of SARS-CoV-2 by FDA under an Emergency Use Authorization (EUA).  This EUA will remain in effect (meaning this test can be used) for the duration of the COVID-19 declaration under Section 564(b)(1) of the Act, 21 U.S.C. section 360bbb-3(b)(1), unless the authorization is terminated or revoked sooner. Performed at [facility], [place] [address], [place], [place] [address]   Culture, blood (routine x 2) Call MD if unable to obtain prior to antibiotics being given     Status: None   Collection Time: 08/03/18  6:33 PM  Result Value Ref Range Status   Specimen Description BLOOD BLOOD LEFT FOREARM  Final   Special Requests   Final    BOTTLES DRAWN AEROBIC ONLY Blood Culture adequate volume   Culture   Final    NO GROWTH 5 DAYS Performed at [facility], [place] [address] [place], [place] [address]    Report Status 08/08/2018 FINAL  Final  Culture, blood (routine x 2) Call MD if unable to obtain prior to antibiotics being given     Status: None   Collection Time: 08/03/18  6:33 PM  Result Value Ref Range Status   Specimen Description BLOOD LEFT ANTECUBITAL  Final   Special Requests   Final    BOTTLES DRAWN AEROBIC ONLY Blood Culture adequate volume   Culture   Final    NO GROWTH 5 DAYS Performed at [facility] [address], [place], [place] [address]    Report Status 08/08/2018 FINAL  Final    MRSA PCR Screening     Status: None   Collection Time: 08/05/18 10:19 AM  Result Value Ref Range Status   MRSA by PCR NEGATIVE NEGATIVE Final    Comment:        The GeneXpert MRSA Assay (FDA approved for NASAL specimens only), is one component of a comprehensive MRSA colonization surveillance program. It is not intended to diagnose MRSA infection nor to guide or monitor treatment for MRSA  infections. Performed at [facility], [place] [address], [place], [place] [address]      Studies: Vas [place] Upper Extremity Venous Duplex  Result Date: 08/09/2018 UPPER VENOUS STUDY  Indications: Swelling, and Erythema Performing Technologist: Maudry Mayhew MHA, RDMS, RVT, RDCS  Examination Guidelines: A complete evaluation includes B-mode imaging, spectral Doppler, color Doppler, and power Doppler as needed of all accessible portions of each vessel. Bilateral testing is considered an integral part of a complete examination. Limited examinations for reoccurring indications may be performed as noted.  Right Findings: +----------+------------+---------+-----------+----------+-------+  RIGHT      Compressible Phasicity Spontaneous Properties Summary  +----------+------------+---------+-----------+----------+-------+  IJV            Full        Yes        Yes                         +----------+------------+---------+-----------+----------+-------+  Subclavian     Full        Yes        Yes                         +----------+------------+---------+-----------+----------+-------+  Axillary       Full        Yes        Yes                         +----------+------------+---------+-----------+----------+-------+  Brachial       Full        Yes        Yes                         +----------+------------+---------+-----------+----------+-------+  Radial         Full                                               +----------+------------+---------+-----------+----------+-------+  Ulnar          Full                                                +----------+------------+---------+-----------+----------+-------+  Cephalic       Full                                               +----------+------------+---------+-----------+----------+-------+  Basilic        Full                                               +----------+------------+---------+-----------+----------+-------+  Left Findings: +----------+------------+---------+-----------+----------+-------+  LEFT       Compressible Phasicity Spontaneous Properties Summary  +----------+------------+---------+-----------+----------+-------+  Subclavian                 Yes        Yes                         +----------+------------+---------+-----------+----------+-------+  Summary:  Right: No evidence of deep vein thrombosis in the upper extremity. No evidence of superficial vein thrombosis in the upper extremity.  Left: No evidence of thrombosis in the subclavian.  *See table(s) above for measurements and observations.  Diagnosing physician: Ruta Hinds MD Electronically signed by Ruta Hinds MD on 08/09/2018 at 9:58:45 AM.    Final     Scheduled Meds:  sodium chloride   Intravenous Once   sodium chloride   Intravenous Once   allopurinol  100 mg Oral BID   dexamethasone  4 mg Intravenous Q24H   docusate sodium  100 mg Oral BID   guaiFENesin  600 mg Oral BID   zolpidem  5 mg Oral QHS    Continuous Infusions:    Zachary Bennett J [place] DO  [facility] 08/09/2018

## 2018-08-09 NOTE — Plan of Care (Signed)
  Problem: Activity: Goal: Risk for activity intolerance will decrease Outcome: Progressing   

## 2018-08-10 DIAGNOSIS — D649 Anemia, unspecified: Secondary | ICD-10-CM

## 2018-08-10 DIAGNOSIS — D693 Immune thrombocytopenic purpura: Secondary | ICD-10-CM

## 2018-08-10 DIAGNOSIS — D47Z9 Other specified neoplasms of uncertain behavior of lymphoid, hematopoietic and related tissue: Secondary | ICD-10-CM

## 2018-08-10 LAB — BPAM PLATELET PHERESIS
Blood Product Expiration Date: 202005182359
Blood Product Expiration Date: 202005262359
ISSUE DATE / TIME: 202005171051
ISSUE DATE / TIME: 202005171152
Unit Type and Rh: 5100
Unit Type and Rh: 7300

## 2018-08-10 LAB — CBC WITH DIFFERENTIAL/PLATELET
Abs Immature Granulocytes: 0 10*3/uL (ref 0.00–0.07)
Basophils Absolute: 0 10*3/uL (ref 0.0–0.1)
Basophils Relative: 0 %
Eosinophils Absolute: 0 10*3/uL (ref 0.0–0.5)
Eosinophils Relative: 0 %
HCT: 25 % — ABNORMAL LOW (ref 39.0–52.0)
Hemoglobin: 7.9 g/dL — ABNORMAL LOW (ref 13.0–17.0)
Immature Granulocytes: 0 %
Lymphocytes Relative: 22 %
Lymphs Abs: 0.1 10*3/uL — ABNORMAL LOW (ref 0.7–4.0)
MCH: 26.6 pg (ref 26.0–34.0)
MCHC: 31.6 g/dL (ref 30.0–36.0)
MCV: 84.2 fL (ref 80.0–100.0)
Monocytes Absolute: 0 10*3/uL — ABNORMAL LOW (ref 0.1–1.0)
Monocytes Relative: 8 %
Neutro Abs: 0.3 10*3/uL — ABNORMAL LOW (ref 1.7–7.7)
Neutrophils Relative %: 70 %
Platelets: 11 10*3/uL — CL (ref 150–400)
RBC: 2.97 MIL/uL — ABNORMAL LOW (ref 4.22–5.81)
RDW: 18.3 % — ABNORMAL HIGH (ref 11.5–15.5)
WBC: 0.4 10*3/uL — CL (ref 4.0–10.5)
nRBC: 0 % (ref 0.0–0.2)

## 2018-08-10 LAB — COMPREHENSIVE METABOLIC PANEL
ALT: 24 U/L (ref 0–44)
AST: 38 U/L (ref 15–41)
Albumin: 1.7 g/dL — ABNORMAL LOW (ref 3.5–5.0)
Alkaline Phosphatase: 163 U/L — ABNORMAL HIGH (ref 38–126)
Anion gap: 6 (ref 5–15)
BUN: 48 mg/dL — ABNORMAL HIGH (ref 8–23)
CO2: 21 mmol/L — ABNORMAL LOW (ref 22–32)
Calcium: 7.2 mg/dL — ABNORMAL LOW (ref 8.9–10.3)
Chloride: 112 mmol/L — ABNORMAL HIGH (ref 98–111)
Creatinine, Ser: 1.23 mg/dL (ref 0.61–1.24)
GFR calc Af Amer: >60 mL/min (ref >60)
GFR calc non Af Amer: 55 mL/min — ABNORMAL LOW (ref >60)
Glucose, Bld: 137 mg/dL — ABNORMAL HIGH (ref 70–99)
Potassium: 4.6 mmol/L (ref 3.5–5.1)
Sodium: 139 mmol/L (ref 135–145)
Total Bilirubin: 1.5 mg/dL — ABNORMAL HIGH (ref 0.3–1.2)
Total Protein: 3.8 g/dL — ABNORMAL LOW (ref 6.5–8.1)

## 2018-08-10 LAB — BCR-ABL1, CML/ALL, PCR, QUANT

## 2018-08-10 LAB — MAGNESIUM: Magnesium: 2.6 mg/dL — ABNORMAL HIGH (ref 1.7–2.4)

## 2018-08-10 LAB — PREPARE PLATELET PHERESIS
Unit division: 0
Unit division: 0

## 2018-08-10 MED ORDER — FUROSEMIDE 10 MG/ML IJ SOLN
40.0000 mg | Freq: Every day | INTRAMUSCULAR | Status: DC
  Administered 2018-08-10 – 2018-08-12 (×3): 40 mg via INTRAVENOUS
  Filled 2018-08-10 (×4): qty 4

## 2018-08-10 NOTE — Progress Notes (Signed)
PROGRESS NOTE  Zachary Bennett FXT:[id number] DOB: 1934/09/08 DOA: 08/03/2018 PCP: Nicholas Lose, MD   LOS: 6 days   Brief narrative: Zachary Bennett is a 84 y.o. male with medical history significant of thrombocytopenia and hyperlipidemia; who presented with complaints of abdominal pain and worsening shortness of breath.  Patient had just been admitted to the hospital from 4/29-5/2 after being found to have splenomegaly with pancytopenia with imaging concerning for lymphoma.  Patient was evaluated by Dr. Lindi Adie and ultimately the plan had been for an outpatient PET scan for targeted biopsy of a hypermetabolic site.  However, patient reported that the PET scan was not scheduled until the 19th of this month.  Since being home, he reported that he had decreased appetite, unable to eat much solid food as he reports that tastes like leather and has a difficult time swallowing it. He also complained of generalized weakness and ambulatory dysfunction with shortness of breath.     Upon admission into the emergency department, patient was noted to be afebrile, pulse 92-100, respirations 18-24, blood pressure 99/58-108/62, and O2 saturation maintained on room air. Patient was placed on 2 L nasal cannula oxygen for comfort.  Labs revealed WBC 1.9, platelets 18, sodium 133, CO2 16, BUN 73, creatinine 2.32, calcium 7.8, alkaline phosphatase 233, albumin 2.5, lipase 68, AST 66 , and total bilirubin 5.9.  Chest x-ray showed right lower lobe infiltrate concerning for possible pneumonia.  Patient was given 1 L normal saline IV fluids, vancomycin, and cefepime.  Patient was then considered for admission to the hospital.  Subjective: Patient reports good sleep overnight.  No significant complaints this morning.  Platelet count up to 11.  Continues with lower extremity edema. Denies headache, no fever/chills/night sweats, no nausea/vomiting/diarrhea, no chest pain, no palpitations, no shortness of breath, no paresthesias.   No acute events overnight per nursing staff.  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 Lindi Adie following, appreciate assistance.  No active bleeding at this time.   --Platelet count down to 14-->10-->10-->5-->8-->11, no signs of active bleeding --No lymphoma noted on bone marrow biopsy prelim report, but myelodysplasia --Pending flow cytometry --Continue dexamethasone 4 mg IV daily --Rituxan infusion on 08/07/2018 for ITP unresponsive to steroids --Platelet transfusion on 5/14, 5/16, 5/17 --transfuse for platelets less than 10 or active bleeding --Medical oncology plans to present case at tumor board this week  Hyperuricemia Uric acid level 10.9 on admission. --s/p rasburicase 6 mg IV on 08/04/2018; uric acid down to 1.2 --Continue allopurinol --Continue to follow daily uric acid   Abdominal pain, secondary to splenomegaly:  Patient noted to have previous CT scan showing severe splenomegaly pretracheal lymph nodes and subcarinal lymph enlargement from 4/29. Oncology on board and had recommended PET scan which is scheduled for 5/19, to evaluate most hypermetabolic area for biopsy.   --status post IR bone marrow biopsy 5/14 which was negative for lymphoma but with myelodysplasia --Flow cytometry pending --Oncology d/w with general surgery regarding possible need for splenectomy, defer for now --Oncology to discuss pathology at tumor board this week --continue oxycodone/fentanyl as needed for abdominal pain.   Acute kidney injury: resolved Etiology likely secondary to volume depletion with poor oral intake prior to hospitalization. likely secondary to volume depletion from poor oral intake.  Also consideration of hyperuricemia. Creatinine  on admission 2.32. --continue with IV fluid hydration.   --Creatinine improving 2.32-->1.68-->1.39-->1.18-->1.35-->1.23 (baseline 1.25 06/2018) --Avoid nephrotoxins, renally dose all medications --daily BMP  Dyspnea likely secondary to community-acquired pneumonia.   On Supplemental oxygen.  Will wean oxygen as able.  Chest x-ray showing signs of a ill-defined airspace opacity in the right medial lung base concerning for possible pneumonia.    Patient does have a large mediastinal lymphadenopathy as well.  Afebrile admission with a white blood cell count of 1.5. --COVID-19 negative --Urine Legionella antigen negative --MRSA PCR negative, discontinue vancomycin today  --s/p 7-day course of cefepime --Blood cultures no growth x 5 days --Titrate supplemental oxygen to maintain SPO2 greater than 92% --Incentive spirometry --Continue supportive care.   Elevated bilirubin/elevated LFTs:  Bilirubin level has slightly improved.. Patient previously noted to have cholelithiasis without concerning symptoms for acute cholecystitis during last hospitalization. May warrant reimaging if symptoms persist  Bilateral lower extremity edema, right upper extremity edema Ultrasound right upper extremity negative for DVT. --Lasix 40 mg IV daily x 5 d --daily weights, strict I/O's --Encourage mobilization  Prolonged QT interval:  QTc503 on admission.  --avoid QT prolonging medications  Hyperlipidemia. Continue to hold statin.  Tobacco abuse:  50-pack-year history.  Quit smoking 2 weeks back.  Emphasized to abstain from smoking.   VTE Prophylaxis: SCDs  Code Status: Full  Family Communication: none  Disposition Plan: continue inpatient hospitalization, pending flow cytometry, no further recommendations from medical oncology.  PT recommends home health PT.  Consultants:  Oncology  Interventional radiology  Procedures:  IR bone marrow biopsy 08/05/2018   Antibiotics: Anti-infectives  (From admission, onward)   Start     Dose/Rate Route Frequency Ordered Stop   08/05/18 1600  vancomycin (VANCOCIN) 1,250 mg in sodium chloride 0.9 % 250 mL IVPB  Status:  Discontinued     1,250 mg 166.7 mL/hr over 90 Minutes Intravenous Every 48 hours 08/03/18 1449 08/05/18 1248   08/05/18 1000  ceFEPIme (MAXIPIME) 2 g in sodium chloride 0.9 % 100 mL IVPB  Status:  Discontinued     2 g 200 mL/hr over 30 Minutes Intravenous Every 12 hours 08/05/18 0852 08/08/18 1010   08/04/18 1400  ceFEPIme (MAXIPIME) 2 g in sodium chloride 0.9 % 100 mL IVPB  Status:  Discontinued     2 g 200 mL/hr over 30 Minutes Intravenous Every 24 hours 08/03/18 1541 08/05/18 0852   08/03/18 1430  ceFEPIme (MAXIPIME) 2 g in sodium chloride 0.9 % 100 mL IVPB     2 g 200 mL/hr over 30 Minutes Intravenous  Once 08/03/18 1420 08/03/18 1512   08/03/18 1430  vancomycin (VANCOCIN) 1,500 mg in sodium chloride 0.9 % 500 mL IVPB     1,500 mg 250 mL/hr over 120 Minutes Intravenous  Once 08/03/18 1421 08/03/18 1744      Objective: Vitals:   08/10/18 0509 08/10/18 0858  BP: 104/68 (!) 110/58  Pulse: 75 85  Resp: 18 18  Temp: (!) 97.4 F (36.3 C) 97.6 F (36.4 C)  SpO2: 100% 99%    Intake/Output Summary (Last 24 hours) at 08/10/2018 1304 Last data filed at 08/10/2018 1114 Gross per 24 hour  Intake 240 ml  Output 2325 ml  Net -2085 ml   Filed Weights   08/06/18 2126 08/08/18 0338 08/08/18 2054  Weight: 82.7 kg 83.9 kg 83.8 kg   Body mass index is 28.09 kg/m.   Physical Exam: GENERAL: Patient is alert awake and  oriented.  On nasal cannula oxygen. HENT: No scleral pallor or icterus. Pupils equally reactive to light. Oral mucosa is moist NECK: is supple, no palpable thyroid enlargement. CHEST: Clear to auscultation. No crackles or wheezes. Non tender on palpation. Diminished breath sounds bilaterally. CVS: S1 and S2 heard, no murmur. Regular rate and rhythm. No pericardial rub. ABDOMEN: Soft, bowel sounds are  present.  Palpable spleen.  Diffuse tenderness of the abdomen. EXTREMITIES: Bilateral lower extremity edema. CNS: Cranial nerves are intact. No focal motor or sensory deficits. SKIN: bruising and edema noted RUE below PICC site  Data Review: I have personally reviewed the following laboratory data and studies,  CBC: Recent Labs  Lab 08/06/18 0449 08/07/18 0322 08/08/18 0355 08/09/18 0333 08/10/18 0346  WBC 1.0* 0.9* 0.6* 0.4* 0.4*  NEUTROABS 0.6* 0.6* 0.5* 0.4* 0.3*  HGB 10.4* 9.7* 8.7* 8.7* 7.9*  HCT 33.5* 30.4* 27.3* 27.3* 25.0*  MCV 86.3 85.4 86.4 83.7 84.2  PLT 10* 10* <5* 8* 11*   Basic Metabolic Panel: Recent Labs  Lab 08/06/18 0449 08/07/18 0322 08/08/18 0355 08/09/18 0333 08/10/18 0346  NA 138 137 138 138 139  K 4.5 4.5 4.9 4.9 4.6  CL 110 115* 112* 114* 112*  CO2 18* 17* 17* 17* 21*  GLUCOSE 98 117* 103* 144* 137*  BUN 38* 39* 45* 49* 48*  CREATININE 1.39* 1.18 1.35* 1.35* 1.23  CALCIUM 7.0* 6.7* 6.7* 7.0* 7.2*  MG  --   --   --   --  2.6*  PHOS  --   --  3.9  --   --    Liver Function Tests: Recent Labs  Lab 08/04/18 0333 08/06/18 0449 08/08/18 0355 08/09/18 0333 08/10/18 0346  AST 51* 63* 72* 47* 38  ALT _0 ALKPHOS 178* 188* 183* 164* 163*  BILITOT 4.3* 3.3* 3.3* 2.1* 1.5*  PROT 3.7* 4.0* 3.4* 3.8* 3.8*  ALBUMIN 1.9* 1.8* 1.5* 1.6* 1.7*   No results for input(s): LIPASE, AMYLASE in the last 168 hours. No results for input(s): AMMONIA in the last 168 hours. Cardiac Enzymes: No results for input(s): CKTOTAL, CKMB, CKMBINDEX, TROPONINI in the last 168 hours. BNP (last 3 results) Recent Labs    08/03/18 1303  BNP 73.0    ProBNP (last 3 results) No results for input(s): PROBNP in the last 8760 hours.  CBG: No results for input(s): GLUCAP in the last 168 hours. Recent Results (from the past 240 hour(s))  SARS Coronavirus 2 (CEPHEID- Performed in [place] hospital lab), Hosp Order     Status: None   Collection Time:  08/03/18  2:45 PM  Result Value Ref Range Status   SARS Coronavirus 2 NEGATIVE NEGATIVE Final    Comment: (NOTE) If result is NEGATIVE SARS-CoV-2 target nucleic acids are NOT DETECTED. The SARS-CoV-2 RNA is generally detectable in upper and lower  respiratory specimens during the acute phase of infection. The lowest  concentration of SARS-CoV-2 viral copies this assay can detect is 250  copies / mL. A negative result does not preclude SARS-CoV-2 infection  and should not be used as the sole basis for treatment or other  patient management decisions.  A negative result may occur with  improper specimen collection / handling, submission of specimen other  than nasopharyngeal swab, presence of viral mutation(s) within the  areas targeted by this assay, and inadequate number of viral copies  (<250 copies / mL). A negative result must be combined with clinical  observations, patient  history, and epidemiological information. If result is POSITIVE SARS-CoV-2 target nucleic acids are DETECTED. The SARS-CoV-2 RNA is generally detectable in upper and lower  respiratory specimens dur ing the acute phase of infection.  Positive  results are indicative of active infection with SARS-CoV-2.  Clinical  correlation with patient history and other diagnostic information is  necessary to determine patient infection status.  Positive results do  not rule out bacterial infection or co-infection with other viruses. If result is PRESUMPTIVE POSTIVE SARS-CoV-2 nucleic acids MAY BE PRESENT.   A presumptive positive result was obtained on the submitted specimen  and confirmed on repeat testing.  While 2019 novel coronavirus  (SARS-CoV-2) nucleic acids may be present in the submitted sample  additional confirmatory testing may be necessary for epidemiological  and / or clinical management purposes  to differentiate between  SARS-CoV-2 and other Sarbecovirus currently known to infect humans.  If clinically  indicated additional testing with an alternate test  methodology [phone]) is advised. The SARS-CoV-2 RNA is generally  detectable in upper and lower respiratory sp ecimens during the acute  phase of infection. The expected result is Negative. Fact Sheet for Patients:  StrictlyIdeas.no Fact Sheet for Healthcare Providers: BankingDealers.co.za This test is not yet approved or cleared by the [place] FDA and has been authorized for detection and/or diagnosis of SARS-CoV-2 by FDA under an Emergency Use Authorization (EUA).  This EUA will remain in effect (meaning this test can be used) for the duration of the COVID-19 declaration under Section 564(b)(1) of the Act, 21 U.S.C. section 360bbb-3(b)(1), unless the authorization is terminated or revoked sooner. Performed at [facility], [place] [address], [place], [place] [address]   Culture, blood (routine x 2) Call MD if unable to obtain prior to antibiotics being given     Status: None   Collection Time: 08/03/18  6:33 PM  Result Value Ref Range Status   Specimen Description BLOOD BLOOD LEFT FOREARM  Final   Special Requests   Final    BOTTLES DRAWN AEROBIC ONLY Blood Culture adequate volume   Culture   Final    NO GROWTH 5 DAYS Performed at [facility], [place] [address], [place], [place] [address]    Report Status 08/08/2018 FINAL  Final  Culture, blood (routine x 2) Call MD if unable to obtain prior to antibiotics being given     Status: None   Collection Time: 08/03/18  6:33 PM  Result Value Ref Range Status   Specimen Description BLOOD LEFT ANTECUBITAL  Final   Special Requests   Final    BOTTLES DRAWN AEROBIC ONLY Blood Culture adequate volume   Culture   Final    NO GROWTH 5 DAYS Performed at [place] [address], [place], [place] [address]    Report Status 08/08/2018 FINAL  Final  MRSA PCR Screening     Status: None   Collection Time: 08/05/18 10:19  AM  Result Value Ref Range Status   MRSA by PCR NEGATIVE NEGATIVE Final    Comment:        The GeneXpert MRSA Assay (FDA approved for NASAL specimens only), is one component of a comprehensive MRSA colonization surveillance program. It is not intended to diagnose MRSA infection nor to guide or monitor treatment for MRSA infections. Performed at [facility], [place] [address] [place], [place] [address]      Studies: No results found.  Scheduled Meds: . sodium chloride  Intravenous Once  . sodium chloride   Intravenous Once  . allopurinol  100 mg Oral BID  . dexamethasone  4 mg Intravenous Q24H  . docusate sodium  100 mg Oral BID  . furosemide  40 mg Intravenous Daily  . guaiFENesin  600 mg Oral BID  . zolpidem  5 mg Oral QHS    Continuous Infusions:    Averill Winters J [place] DO  [facility] 08/10/2018

## 2018-08-10 NOTE — Progress Notes (Signed)
Physical Therapy Treatment Patient Details Name: Zachary Bennett MRN: [id number] DOB: [birth year] Today's Date: 08/10/2018    History of Present Illness 84 y.o. male admitted with generalized weakness SOB, found to have CAP and AKI, abdominal pain secondary to splenomegaly. Pt currently awaiting PET scan for prior concern of lymphoma.     PT Comments    Continuing work on functional mobility and activity tolerance;  Able to progress distance today; At this point, recommend RW for stability with ambulation   Follow Up Recommendations  Home health PT;Supervision/Assistance - 24 hour(and Sons physical support)     Equipment Recommendations  Rolling walker with 5" wheels;3in1 (PT)    Recommendations for Other Services       Precautions / Restrictions Precautions Precautions: Fall    Mobility  Bed Mobility Overal bed mobility: Needs Assistance Bed Mobility: Supine to Sit     Supine to sit: Min assist     General bed mobility comments: Min assist to pull to sit  Transfers   Equipment used: Rolling walker (2 wheeled) Transfers: Sit to/from Stand Sit to Stand: Min guard         General transfer comment: Minguard for safety  Ambulation/Gait Ambulation/Gait assistance: Min guard Gait Distance (Feet): 40 Feet Assistive device: Rolling walker (2 wheeled) Gait Pattern/deviations: Step-through pattern;Decreased stride length;Decreased step length - right;Decreased step length - left Gait velocity: decreased   General Gait Details: Noted dependent on UE support of RW; cues to self-monitor for activity tolerance   Stairs             Wheelchair Mobility    Modified Rankin (Stroke Patients Only)       Balance Overall balance assessment: Needs assistance   Sitting balance-Leahy Scale: Fair       Standing balance-Leahy Scale: Poor                              Cognition Arousal/Alertness: Awake/alert Behavior During Therapy: WFL for tasks  assessed/performed Overall Cognitive Status: Within Functional Limits for tasks assessed                                        Exercises      General Comments        Pertinent Vitals/Pain Pain Assessment: No/denies pain    Home Living                      Prior Function            PT Goals (current goals can now be found in the care plan section) Acute Rehab PT Goals Patient Stated Goal: go home with family PT Goal Formulation: With patient Potential to Achieve Goals: Fair Progress towards PT goals: Progressing toward goals    Frequency    Min 3X/week      PT Plan Current plan remains appropriate    Co-evaluation              AM-PAC PT "6 Clicks" Mobility   Outcome Measure  Help needed turning from your back to your side while in a flat bed without using bedrails?: None Help needed moving from lying on your back to sitting on the side of a flat bed without using bedrails?: None Help needed moving to and from a bed to a chair (including a wheelchair)?: A Little Help  needed standing up from a chair using your arms (e.g., wheelchair or bedside chair)?: A Little Help needed to walk in hospital room?: A Little Help needed climbing 3-5 steps with a railing? : A Lot 6 Click Score: 19    End of Session Equipment Utilized During Treatment: Gait belt Activity Tolerance: Patient tolerated treatment well Patient left: in bed Nurse Communication: Mobility status PT Visit Diagnosis: Unsteadiness on feet (R26.81)     Time: 1624-4695 PT Time Calculation (min) (ACUTE ONLY): 25 min  Charges:  $Gait Training: 23-37 mins                     Roney Marion, [place] Pager [phone] Office [place] 08/10/2018, 3:42 PM

## 2018-08-10 NOTE — Progress Notes (Signed)
HEMATOLOGY-ONCOLOGY PROGRESS NOTE  SUBJECTIVE: Bruises on the arms, patient is sitting in the chair.  Abdominal pain has improved.  He has tremendous appetite and is eating well. After Rituxan on Friday he did not have any infusion reaction.  OBJECTIVE: REVIEW OF SYSTEMS:   Constitutional: Denies fevers, chills or abnormal weight loss Eyes: Denies blurriness of vision Ears, nose, mouth, throat, and face: Poor dentition, denies mucositis or sore throat Respiratory: Denies cough, dyspnea or wheezes Cardiovascular: Denies palpitation, chest discomfort Gastrointestinal:  Denies nausea, heartburn or change in bowel habits Skin: Bruises due to low platelets  Neurological:Denies numbness, tingling or new weaknesses Behavioral/Psych: Mood is stable, no new changes  Extremities: 2+ lower extremity edema All other systems were reviewed with the patient and are negative.  I have reviewed the past medical history, past surgical history, social history and family history with the patient and they are unchanged from previous note.   PHYSICAL EXAMINATION: ECOG PERFORMANCE STATUS: 2 - Symptomatic, <50% confined to bed  Vitals:   08/10/18 0509 08/10/18 0858  BP: 104/68 (!) 110/58  Pulse: 75 85  Resp: 18 18  Temp: (!) 97.4 F (36.3 C) 97.6 F (36.4 C)  SpO2: 100% 99%   Filed Weights   08/06/18 2126 08/08/18 0338 08/08/18 2054  Weight: 182 lb 4.8 oz (82.7 kg) 185 lb (83.9 kg) 184 lb 11.9 oz (83.8 kg)    GENERAL:alert, no distress and comfortable SKIN: Bruising on the arms and forearms and elbows EYES: normal, Conjunctiva are pink and non-injected, sclera clear OROPHARYNX:no exudate, no erythema and lips, buccal mucosa, and tongue normal  NECK: supple, thyroid normal size, non-tender, without nodularity LYMPH:  no palpable lymphadenopathy in the cervical, axillary or inguinal LUNGS: clear to auscultation and percussion with normal breathing effort HEART: regular rate & rhythm and no  murmurs and no lower extremity edema ABDOMEN:abdomen soft, non-tender and normal bowel sounds Musculoskeletal:no cyanosis of digits and no clubbing  NEURO: alert & oriented x 3 with fluent speech, no focal motor/sensory deficits  LABORATORY DATA:  I have reviewed the data as listed CMP Latest Ref Rng & Units 08/10/2018 08/09/2018 08/08/2018  Glucose 70 - 99 mg/dL 137(H) 144(H) 103(H)  BUN 8 - 23 mg/dL 48(H) 49(H) 45(H)  Creatinine 0.61 - 1.24 mg/dL 1.23 1.35(H) 1.35(H)  Sodium 135 - 145 mmol/L 139 138 138  Potassium 3.5 - 5.1 mmol/L 4.6 4.9 4.9  Chloride 98 - 111 mmol/L 112(H) 114(H) 112(H)  CO2 22 - 32 mmol/L 21(L) 17(L) 17(L)  Calcium 8.9 - 10.3 mg/dL 7.2(L) 7.0(L) 6.7(L)  Total Protein 6.5 - 8.1 g/dL 3.8(L) 3.8(L) 3.4(L)  Total Bilirubin 0.3 - 1.2 mg/dL 1.5(H) 2.1(H) 3.3(H)  Alkaline Phos 38 - 126 U/L 163(H) 164(H) 183(H)  AST 15 - 41 U/L 38 47(H) 72(H)  ALT 0 - 44 U/L _0 Lab Results  Component Value Date   WBC 0.4 (LL) 08/10/2018   HGB 7.9 (L) 08/10/2018   HCT 25.0 (L) 08/10/2018   MCV 84.2 08/10/2018   PLT 11 (LL) 08/10/2018   NEUTROABS 0.3 (L) 08/10/2018    ASSESSMENT AND PLAN: 1.  B-cell lymphoproliferative disorder with ITP Status post 1 cycle of Rituxan Monitoring platelet count. Slight improvement has been noted. No need to transfuse platelets. 2.  Bone marrow biopsy nondiagnostic. We discussed different options of his platelets did not improve. We may consider giving him a second dose of Rituxan as well.  3.  Patient is working with physical therapy and  trying to ambulate. I strongly recommended him to stay up and do more physical activity so that his general muscle strength does not get weaker. 4.  Kidney function liver function slowly getting better 5.  Leukopenia and anemia: Being monitored without any growth factor support.

## 2018-08-11 ENCOUNTER — Telehealth: Payer: Self-pay | Admitting: Hematology and Oncology

## 2018-08-11 ENCOUNTER — Ambulatory Visit (HOSPITAL_COMMUNITY): Payer: Medicare Other

## 2018-08-11 LAB — CBC WITH DIFFERENTIAL/PLATELET
Abs Immature Granulocytes: 0 10*3/uL (ref 0.00–0.07)
Basophils Absolute: 0 10*3/uL (ref 0.0–0.1)
Basophils Relative: 0 %
Eosinophils Absolute: 0 10*3/uL (ref 0.0–0.5)
Eosinophils Relative: 0 %
HCT: 24.6 % — ABNORMAL LOW (ref 39.0–52.0)
Hemoglobin: 7.9 g/dL — ABNORMAL LOW (ref 13.0–17.0)
Immature Granulocytes: 0 %
Lymphocytes Relative: 37 %
Lymphs Abs: 0.1 10*3/uL — ABNORMAL LOW (ref 0.7–4.0)
MCH: 26.9 pg (ref 26.0–34.0)
MCHC: 32.1 g/dL (ref 30.0–36.0)
MCV: 83.7 fL (ref 80.0–100.0)
Monocytes Absolute: 0 10*3/uL — ABNORMAL LOW (ref 0.1–1.0)
Monocytes Relative: 9 %
Neutro Abs: 0.2 10*3/uL — ABNORMAL LOW (ref 1.7–7.7)
Neutrophils Relative %: 54 %
Platelets: 6 10*3/uL — CL (ref 150–400)
RBC: 2.94 MIL/uL — ABNORMAL LOW (ref 4.22–5.81)
RDW: 18.3 % — ABNORMAL HIGH (ref 11.5–15.5)
WBC: 0.4 10*3/uL — CL (ref 4.0–10.5)
nRBC: 0 % (ref 0.0–0.2)

## 2018-08-11 LAB — URIC ACID: Uric Acid, Serum: 3 mg/dL — ABNORMAL LOW (ref 3.7–8.6)

## 2018-08-11 LAB — COMPREHENSIVE METABOLIC PANEL
ALT: 23 U/L (ref 0–44)
AST: 34 U/L (ref 15–41)
Albumin: 1.8 g/dL — ABNORMAL LOW (ref 3.5–5.0)
Alkaline Phosphatase: 160 U/L — ABNORMAL HIGH (ref 38–126)
Anion gap: 8 (ref 5–15)
BUN: 43 mg/dL — ABNORMAL HIGH (ref 8–23)
CO2: 23 mmol/L (ref 22–32)
Calcium: 7.4 mg/dL — ABNORMAL LOW (ref 8.9–10.3)
Chloride: 110 mmol/L (ref 98–111)
Creatinine, Ser: 1.04 mg/dL (ref 0.61–1.24)
GFR calc Af Amer: >60 mL/min (ref >60)
GFR calc non Af Amer: >60 mL/min (ref >60)
Glucose, Bld: 130 mg/dL — ABNORMAL HIGH (ref 70–99)
Potassium: 4.6 mmol/L (ref 3.5–5.1)
Sodium: 141 mmol/L (ref 135–145)
Total Bilirubin: 1.5 mg/dL — ABNORMAL HIGH (ref 0.3–1.2)
Total Protein: 4 g/dL — ABNORMAL LOW (ref 6.5–8.1)

## 2018-08-11 MED ORDER — DIPHENHYDRAMINE HCL 25 MG PO CAPS
25.0000 mg | ORAL_CAPSULE | Freq: Every day | ORAL | Status: DC
  Administered 2018-08-11: 25 mg via ORAL
  Filled 2018-08-11: qty 1

## 2018-08-11 MED ORDER — SODIUM CHLORIDE 0.9 % IV SOLN
375.0000 mg/m2 | Freq: Once | INTRAVENOUS | Status: AC
  Administered 2018-08-14: 800 mg via INTRAVENOUS
  Filled 2018-08-11: qty 80

## 2018-08-11 MED ORDER — IMMUNE GLOBULIN (HUMAN) 5 GM/50ML IV SOLN
1.0000 g/kg | INTRAVENOUS | Status: DC
  Administered 2018-08-11: 85 g via INTRAVENOUS
  Filled 2018-08-11: qty 850
  Filled 2018-08-11: qty 800

## 2018-08-11 MED ORDER — SODIUM CHLORIDE 0.9% IV SOLUTION
Freq: Once | INTRAVENOUS | Status: AC
  Administered 2018-08-11: 13:00:00 via INTRAVENOUS

## 2018-08-11 MED ORDER — ACETAMINOPHEN 325 MG PO TABS
650.0000 mg | ORAL_TABLET | Freq: Every day | ORAL | Status: DC
  Administered 2018-08-11: 650 mg via ORAL
  Filled 2018-08-11: qty 2

## 2018-08-11 MED ORDER — DIPHENHYDRAMINE HCL 25 MG PO CAPS: 25.0000 mg | ORAL_CAPSULE | Freq: Once | ORAL | Status: DC

## 2018-08-11 NOTE — Telephone Encounter (Signed)
Spoke with pt and he is in the hospital. Please cancel appt for 08/13/18

## 2018-08-11 NOTE — Progress Notes (Signed)
Occupational Therapy Treatment Patient Details Name: Zachary Bennett MRN: [id number] DOB: 1934/04/25 Today's Date: 08/11/2018    History of present illness 84 y.o. male admitted with generalized weakness SOB, found to have CAP and AKI, abdominal pain secondary to splenomegaly. Pt currently awaiting PET scan for prior concern of lymphoma.    OT comments  Pt progressing toward established goals. Pt currently requires minguard for ADL and minA for functional mobility. Pt completed grooming task while standing at sink level with minguard assistance. Pt will continue to benefit from skill OT services to maximize safety and independence with ADL and functional mobility. Will continue to follow acutely.     Follow Up Recommendations  Home health OT;Supervision/Assistance - 24 hour    Equipment Recommendations  3 in 1 bedside commode    Recommendations for Other Services      Precautions / Restrictions Precautions Precautions: Fall       Mobility Bed Mobility Overal bed mobility: Needs Assistance Bed Mobility: Sit to Supine;Supine to Sit     Supine to sit: Min assist Sit to supine: Min assist   General bed mobility comments: min assist to pull to sit, minA to progress LE return to bed  Transfers Overall transfer level: Needs assistance Equipment used: Rolling walker (2 wheeled) Transfers: Sit to/from Stand Sit to Stand: Min guard         General transfer comment: Minguard for safety    Balance Overall balance assessment: Needs assistance Sitting-balance support: No upper extremity supported Sitting balance-Leahy Scale: Fair     Standing balance support: No upper extremity supported Standing balance-Leahy Scale: Fair Standing balance comment: pt able to static stand unsupported with minguard                           ADL either performed or assessed with clinical judgement   ADL Overall ADL's : Needs assistance/impaired     Grooming: Min  guard;Standing Grooming Details (indicate cue type and reason): pt completed grooming task while standing at sink with minguard for stability                  Toilet Transfer: Minimal assistance;RW;Ambulation Toilet Transfer Details (indicate cue type and reason): simulated sit<>stand from/return to EOB with in room ambulation          Functional mobility during ADLs: Minimal assistance;Min guard General ADL Comments: pt required minA for mobility;minguard for static standing;DoE 3/4 during activity;     Vision       Perception     Praxis      Cognition Arousal/Alertness: Awake/alert Behavior During Therapy: WFL for tasks assessed/performed Overall Cognitive Status: Within Functional Limits for tasks assessed                                 General Comments: Pt appeared agitated at start of session, stating he doesn't want to get out of bed until he has gotten an update about his platelets", educated pt on importance of mobility;after conversing about pt's goals to maintain strength and independence, pt agreeable to session        Exercises     Shoulder Instructions       General Comments pt on RA upon arrival, SpO2 80%, provided supplemental O2 2lnc SpO2 100 after 77min pursed lip breathing    Pertinent Vitals/ Pain       Pain Assessment: No/denies pain Pain  Intervention(s): Limited activity within patient's tolerance;Monitored during session  Home Living                                          Prior Functioning/Environment              Frequency  Min 2X/week        Progress Toward Goals  OT Goals(current goals can now be found in the care plan section)  Progress towards OT goals: Progressing toward goals  Acute Rehab OT Goals Patient Stated Goal: go home with family OT Goal Formulation: With patient Time For Goal Achievement: 08/22/18 Potential to Achieve Goals: Good ADL Goals Pt Will Perform Grooming:  Independently Pt Will Perform Lower Body Dressing: Independently Pt Will Transfer to Toilet: Independently;ambulating Pt Will Perform Tub/Shower Transfer: Independently  Plan Discharge plan remains appropriate    Co-evaluation                 AM-PAC OT "6 Clicks" Daily Activity     Outcome Measure   Help from another person eating meals?: None Help from another person taking care of personal grooming?: A Little Help from another person toileting, which includes using toliet, bedpan, or urinal?: A Little Help from another person bathing (including washing, rinsing, drying)?: A Little Help from another person to put on and taking off regular upper body clothing?: A Little Help from another person to put on and taking off regular lower body clothing?: A Little 6 Click Score: 19    End of Session Equipment Utilized During Treatment: Oxygen;Rolling walker;Gait belt  OT Visit Diagnosis: Unsteadiness on feet (R26.81);Other abnormalities of gait and mobility (R26.89);Muscle weakness (generalized) (M62.81)   Activity Tolerance Patient tolerated treatment well   Patient Left in bed;with call bell/phone within reach;with bed alarm set   Nurse Communication Mobility status;Other (comment)(SpO2 status)        Time: 4627-0350 OT Time Calculation (min): 35 min  Charges: OT General Charges $OT Visit: 1 Visit OT Treatments $Self Care/Home Management : 23-37 mins  Dorinda Hill OTR/L [place] Office: [place] 08/11/2018, 4:16 PM

## 2018-08-11 NOTE — Progress Notes (Signed)
PROGRESS NOTE  Zachary Bennett TSV:[id number] DOB: [birth year] DOA: 08/03/2018 PCP: Nicholas Lose, MD   LOS: 7 days   Brief narrative: Zachary Bennett is a 84 y.o. male with medical history significant of thrombocytopenia and hyperlipidemia; who presented with complaints of abdominal pain and worsening shortness of breath.  Patient had just been admitted to the hospital from 4/29-5/2 after being found to have splenomegaly with pancytopenia with imaging concerning for lymphoma.  Patient was evaluated by Dr. Lindi Adie and ultimately the plan had been for an outpatient PET scan for targeted biopsy of a hypermetabolic site.  However, patient reported that the PET scan was not scheduled until the 19th of this month.  Since being home, he reported that he had decreased appetite, unable to eat much solid food as he reports that tastes like leather and has a difficult time swallowing it. He also complained of generalized weakness and ambulatory dysfunction with shortness of breath.     Upon admission into the emergency department, patient was noted to be afebrile, pulse 92-100, respirations 18-24, blood pressure 99/58-108/62, and O2 saturation maintained on room air. Patient was placed on 2 L nasal cannula oxygen for comfort.  Labs revealed WBC 1.9, platelets 18, sodium 133, CO2 16, BUN 73, creatinine 2.32, calcium 7.8, alkaline phosphatase 233, albumin 2.5, lipase 68, AST 66 , and total bilirubin 5.9.  Chest x-ray showed right lower lobe infiltrate concerning for possible pneumonia.  Patient was given 1 L normal saline IV fluids, vancomycin, and cefepime.  Patient was then considered for admission to the hospital.  Subjective: Patient emotional this morning due to fact very little progress in his thrombocytopenia.  Platelet count down to 6 today.  Continues with scrotal and lower extremity edema, slightly improved.  Oncology continue to follow, plan IVIG on 5/19 and 5/20.  1 platelet pack today.  No other  complaints/concerns today.  Denies headache, no fever/chills/night sweats, no nausea/vomiting/diarrhea, no chest pain, no palpitations, no shortness of breath, no paresthesias.  No acute events overnight per nursing staff.  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 Lindi Adie following, appreciate assistance.  No active bleeding at this time.   --Platelet count 14-->10-->10-->5-->8-->11-->6, no signs of active bleeding --No lymphoma noted on bone marrow biopsy prelim report, but myelodysplasia --Pending flow cytometry --Continue dexamethasone 4 mg IV daily --Rituxan infusion on 08/07/2018 for ITP unresponsive to steroids --Platelet transfusion on 5/14, 5/16, 5/17, 5/19 --Plan IVIG on 5/19 and 5/20 --transfuse for platelets less than 10 or active bleeding --Medical oncology plans to present case at tumor board this week  Hyperuricemia Uric acid level 10.9 on admission. --s/p rasburicase 6 mg IV on 08/04/2018; uric acid down to 1.2 --Continue allopurinol --Continue to follow daily uric acid   Abdominal pain, secondary to splenomegaly:  Patient noted to have previous CT scan showing severe splenomegaly pretracheal lymph nodes and subcarinal lymph enlargement from 4/29. Oncology on board and had recommended PET scan which is scheduled for 5/19, to evaluate most hypermetabolic area for biopsy.   --status post IR bone marrow biopsy 5/14 which was negative for lymphoma but with myelodysplasia --Flow cytometry pending --Oncology d/w with general surgery regarding possible need for splenectomy, defer for now --Oncology to discuss pathology at tumor board  this week --continue oxycodone/fentanyl as needed for  abdominal pain.   Acute kidney injury: resolved Etiology likely secondary to volume depletion with poor oral intake prior to hospitalization. likely secondary to volume depletion from poor oral intake.  Also consideration of hyperuricemia. Creatinine on admission 2.32. --continue with IV fluid hydration.   --Creatinine improving 2.32-->1.68-->1.39-->1.18-->1.35-->1.23-->1.04 (baseline 1.25 06/2018) --Avoid nephrotoxins, renally dose all medications --daily BMP  Dyspnea likely secondary to community-acquired pneumonia.   On Supplemental oxygen.  Will wean oxygen as able.  Chest x-ray showing signs of a ill-defined airspace opacity in the right medial lung base concerning for possible pneumonia.    Patient does have a large mediastinal lymphadenopathy as well.  Afebrile admission with a white blood cell count of 1.5. --COVID-19 negative --Urine Legionella antigen negative --MRSA PCR negative, discontinue vancomycin today  --s/p 7-day course of cefepime --Blood cultures no growth x 5 days --Titrate supplemental oxygen to maintain SPO2 greater than 92% --Incentive spirometry --Continue supportive care.   Elevated bilirubin/elevated LFTs:  Bilirubin level has slightly improved.. Patient previously noted to have cholelithiasis without concerning symptoms for acute cholecystitis during last hospitalization. May warrant reimaging if symptoms persist  Bilateral lower extremity edema, right upper extremity edema Ultrasound right upper extremity negative for DVT. --Lasix 40 mg IV daily x 5 d --daily weights, strict I/O's --Encourage mobilization  Prolonged QT interval:  QTc503 on admission.  --avoid QT prolonging medications  Hyperlipidemia. Continue to hold statin.  Tobacco abuse:  50-pack-year history.  Quit smoking 2 weeks back.  Emphasized to abstain from smoking.   VTE Prophylaxis: SCDs  Code Status: Full  Family Communication: none  Disposition Plan: continue inpatient  hospitalization, pending flow cytometry, plan IVIG infusion over next two days,  further recommendations per medical oncology.  PT recommends home health PT with 3 in 1 bedside commode, rolling walker with 5 inch wheels.  Consultants:  Oncology  Interventional radiology  Procedures:  IR bone marrow biopsy 08/05/2018   IVIG infusion planned for 5/19 and 5/20  Antibiotics: Anti-infectives (From admission, onward)   Start     Dose/Rate Route Frequency Ordered Stop   08/05/18 1600  vancomycin (VANCOCIN) 1,250 mg in sodium chloride 0.9 % 250 mL IVPB  Status:  Discontinued     1,250 mg 166.7 mL/hr over 90 Minutes Intravenous Every 48 hours 08/03/18 1449 08/05/18 1248   08/05/18 1000  ceFEPIme (MAXIPIME) 2 g in sodium chloride 0.9 % 100 mL IVPB  Status:  Discontinued     2 g 200 mL/hr over 30 Minutes Intravenous Every 12 hours 08/05/18 0852 08/08/18 1010   08/04/18 1400  ceFEPIme (MAXIPIME) 2 g in sodium chloride 0.9 % 100 mL IVPB  Status:  Discontinued     2 g 200 mL/hr over 30 Minutes Intravenous Every 24 hours 08/03/18 1541 08/05/18 0852   08/03/18 1430  ceFEPIme (MAXIPIME) 2 g in sodium chloride 0.9 % 100 mL IVPB     2 g 200 mL/hr over 30 Minutes Intravenous  Once 08/03/18 1420 08/03/18 1512   08/03/18 1430  vancomycin (VANCOCIN) 1,500 mg in sodium chloride 0.9 % 500 mL IVPB     1,500 mg 250 mL/hr over 120 Minutes Intravenous  Once 08/03/18 1421 08/03/18 1744      Objective: Vitals:   08/11/18 1014 08/11/18 1228  BP: 122/76 107/64  Pulse: 94 93  Resp: 18 20  Temp: 97.9 F (36.6 C)   SpO2: 99% 100%    Intake/Output Summary (Last 24 hours) at 08/11/2018 1228  Last data filed at 08/11/2018 1142 Gross per 24 hour  Intake 960 ml  Output 2405 ml  Net -1445 ml   Filed Weights   08/08/18 0338 08/08/18 2054 08/10/18 2158  Weight: 83.9 kg 83.8 kg 84 kg   Body mass index is 28.14 kg/m.   Physical Exam: GENERAL: Patient is alert awake and oriented.  On nasal cannula oxygen.  HENT: No scleral pallor or icterus. Pupils equally reactive to light. Oral mucosa is moist NECK: is supple, no palpable thyroid enlargement. CHEST: Clear to auscultation. No crackles or wheezes. Non tender on palpation. Diminished breath sounds bilaterally. CVS: S1 and S2 heard, no murmur. Regular rate and rhythm. No pericardial rub. ABDOMEN: Soft, bowel sounds are present.  Palpable spleen.  Diffuse tenderness of the abdomen. GU: Mild scrotal edema, condom catheter in place EXTREMITIES: Bilateral lower extremity edema. CNS: Cranial nerves are intact. No focal motor or sensory deficits. SKIN: bruising and edema noted RUE below PICC site  Data Review: I have personally reviewed the following laboratory data and studies,  CBC: Recent Labs  Lab 08/07/18 0322 08/08/18 0355 08/09/18 0333 08/10/18 0346 08/11/18 0350  WBC 0.9* 0.6* 0.4* 0.4* 0.4*  NEUTROABS 0.6* 0.5* 0.4* 0.3* 0.2*  HGB 9.7* 8.7* 8.7* 7.9* 7.9*  HCT 30.4* 27.3* 27.3* 25.0* 24.6*  MCV 85.4 86.4 83.7 84.2 83.7  PLT 10* <5* 8* 11* 6*   Basic Metabolic Panel: Recent Labs  Lab 08/07/18 0322 08/08/18 0355 08/09/18 0333 08/10/18 0346 08/11/18 0350  NA 137 138 138 139 141  K 4.5 4.9 4.9 4.6 4.6  CL 115* 112* 114* 112* 110  CO2 17* 17* 17* 21* 23  GLUCOSE 117* 103* 144* 137* 130*  BUN 39* 45* 49* 48* 43*  CREATININE 1.18 1.35* 1.35* 1.23 1.04  CALCIUM 6.7* 6.7* 7.0* 7.2* 7.4*  MG  --   --   --  2.6*  --   PHOS  --  3.9  --   --   --    Liver Function Tests: Recent Labs  Lab 08/06/18 0449 08/08/18 0355 08/09/18 0333 08/10/18 0346 08/11/18 0350  AST 63* 72* 47* 38 34  ALT _0 ALKPHOS 188* 183* 164* 163* 160*  BILITOT 3.3* 3.3* 2.1* 1.5* 1.5*  PROT 4.0* 3.4* 3.8* 3.8* 4.0*  ALBUMIN 1.8* 1.5* 1.6* 1.7* 1.8*   No results for input(s): LIPASE, AMYLASE in the last 168 hours. No results for input(s): AMMONIA in the last 168 hours. Cardiac Enzymes: No results for input(s): CKTOTAL, CKMB, CKMBINDEX,  TROPONINI in the last 168 hours. BNP (last 3 results) Recent Labs    08/03/18 1303  BNP 73.0    ProBNP (last 3 results) No results for input(s): PROBNP in the last 8760 hours.  CBG: No results for input(s): GLUCAP in the last 168 hours. Recent Results (from the past 240 hour(s))  SARS Coronavirus 2 (CEPHEID- Performed in [place] hospital lab), Hosp Order     Status: None   Collection Time: 08/03/18  2:45 PM  Result Value Ref Range Status   SARS Coronavirus 2 NEGATIVE NEGATIVE Final    Comment: (NOTE) If result is NEGATIVE SARS-CoV-2 target nucleic acids are NOT DETECTED. The SARS-CoV-2 RNA is generally detectable in upper and lower  respiratory specimens during the acute phase of infection. The lowest  concentration of SARS-CoV-2 viral copies this assay can detect is 250  copies / mL. A negative result does not preclude SARS-CoV-2 infection  and should not be  used as the sole basis for treatment or other  patient management decisions.  A negative result may occur with  improper specimen collection / handling, submission of specimen other  than nasopharyngeal swab, presence of viral mutation(s) within the  areas targeted by this assay, and inadequate number of viral copies  (<250 copies / mL). A negative result must be combined with clinical  observations, patient history, and epidemiological information. If result is POSITIVE SARS-CoV-2 target nucleic acids are DETECTED. The SARS-CoV-2 RNA is generally detectable in upper and lower  respiratory specimens dur ing the acute phase of infection.  Positive  results are indicative of active infection with SARS-CoV-2.  Clinical  correlation with patient history and other diagnostic information is  necessary to determine patient infection status.  Positive results do  not rule out bacterial infection or co-infection with other viruses. If result is PRESUMPTIVE POSTIVE SARS-CoV-2 nucleic acids MAY BE PRESENT.   A presumptive  positive result was obtained on the submitted specimen  and confirmed on repeat testing.  While 2019 novel coronavirus  (SARS-CoV-2) nucleic acids may be present in the submitted sample  additional confirmatory testing may be necessary for epidemiological  and / or clinical management purposes  to differentiate between  SARS-CoV-2 and other Sarbecovirus currently known to infect humans.  If clinically indicated additional testing with an alternate test  methodology [id number]) is advised. The SARS-CoV-2 RNA is generally  detectable in upper and lower respiratory sp ecimens during the acute  phase of infection. The expected result is Negative. Fact Sheet for Patients:  StrictlyIdeas.no Fact Sheet for Healthcare Providers: BankingDealers.co.za This test is not yet approved or cleared by the [place] FDA and has been authorized for detection and/or diagnosis of SARS-CoV-2 by FDA under an Emergency Use Authorization (EUA).  This EUA will remain in effect (meaning this test can be used) for the duration of the COVID-19 declaration under Section 564(b)(1) of the Act, 21 U.S.C. section 360bbb-3(b)(1), unless the authorization is terminated or revoked sooner. Performed at [facility], [place] [address], [place], [place] [address]   Culture, blood (routine x 2) Call MD if unable to obtain prior to antibiotics being given     Status: None   Collection Time: 08/03/18  6:33 PM  Result Value Ref Range Status   Specimen Description BLOOD BLOOD LEFT FOREARM  Final   Special Requests   Final    BOTTLES DRAWN AEROBIC ONLY Blood Culture adequate volume   Culture   Final    NO GROWTH 5 DAYS Performed at [facility], [place] [address], [place], [place] [address]    Report Status 08/08/2018 FINAL  Final  Culture, blood (routine x 2) Call MD if unable to obtain prior to antibiotics being given     Status: None   Collection Time: 08/03/18  6:33  PM  Result Value Ref Range Status   Specimen Description BLOOD LEFT ANTECUBITAL  Final   Special Requests   Final    BOTTLES DRAWN AEROBIC ONLY Blood Culture adequate volume   Culture   Final    NO GROWTH 5 DAYS Performed at [facility] [address] [place], [place] [address]    Report Status 08/08/2018 FINAL  Final  MRSA PCR Screening     Status: None   Collection Time: 08/05/18 10:19 AM  Result Value Ref Range Status   MRSA by PCR NEGATIVE NEGATIVE Final    Comment:  The GeneXpert MRSA Assay (FDA approved for NASAL specimens only), is one component of a comprehensive MRSA colonization surveillance program. It is not intended to diagnose MRSA infection nor to guide or monitor treatment for MRSA infections. Performed at [facility], [place] [address], [place], [place] [address]      Studies: No results found.  Scheduled Meds: . sodium chloride   Intravenous Once  . sodium chloride   Intravenous Once  . sodium chloride   Intravenous Once  . acetaminophen  650 mg Oral Daily  . allopurinol  100 mg Oral BID  . dexamethasone  4 mg Intravenous Q24H  . diphenhydrAMINE  25 mg Oral Daily  . docusate sodium  100 mg Oral BID  . furosemide  40 mg Intravenous Daily  . guaiFENesin  600 mg Oral BID  . zolpidem  5 mg Oral QHS    Continuous Infusions: . IMMUNE GLOBULIN 10% (HUMAN) IV - For Fluid Restriction Only       Gene Glazebrook J [place] DO  [facility] 08/11/2018

## 2018-08-11 NOTE — Progress Notes (Addendum)
HEMATOLOGY-ONCOLOGY PROGRESS NOTE  SUBJECTIVE:  Has ongoing fatigue.  Denies bleeding this morning. Has multiple bruises to his arms. Abdominal pain resolved. Up to the chair. Has been ambulating with PT. No other complaints this morning.   REVIEW OF SYSTEMS:   Constitutional: Denies fevers, chills.  Reports progressive fatigue and weakness.  Reports night sweats. Eyes: Denies blurriness of vision Ears, nose, mouth, throat, and face: Denies mucositis or sore throat Respiratory: Reports productive cough and shortness of breath. cardiovascular: Denies palpitation, chest discomfort Gastrointestinal: Denies abdominal pain, nausea, vomiting.  Skin: Denies abnormal skin rashes Lymphatics: Denies new lymphadenopathy.  Bruises easily. Neurological:Denies numbness, tingling or new weaknesses Behavioral/Psych: Mood is stable, no new changes  Extremities: Reports lower extremity edema. All other systems were reviewed with the patient and are negative.  I have reviewed the past medical history, past surgical history, social history and family history with the patient and they are unchanged from previous note.   PHYSICAL EXAMINATION: ECOG PERFORMANCE STATUS: 2 - Symptomatic, <50% confined to bed  Vitals:   08/11/18 0509 08/11/18 1014  BP: 111/74 122/76  Pulse: 81 94  Resp: 17 18  Temp: (!) 97.5 F (36.4 C) 97.9 F (36.6 C)  SpO2: 100% 99%   Filed Weights   08/08/18 0338 08/08/18 2054 08/10/18 2158  Weight: 185 lb (83.9 kg) 184 lb 11.9 oz (83.8 kg) 185 lb 1.6 oz (84 kg)    GENERAL:alert, no distress and comfortable SKIN: skin color, texture, turgor are normal, no rashes or significant lesions. Multiple bruises noted over bilateral arms.  EYES: normal, Conjunctiva are pink and non-injected, sclera clear OROPHARYNX:no exudate, no erythema and lips, buccal mucosa, and tongue normal  NECK: supple, thyroid normal size, non-tender, without nodularity LYMPH:  no palpable lymphadenopathy in the  cervical, axillary or inguinal LUNGS: Diminished breath sounds bilaterally HEART: regular rate & rhythm and no murmurs.  1+ bilateral lower extremity edema. Arms are edematous.  ABDOMEN: Normal bowel sounds, abdomen soft, diffuse tenderness with light palpation.  Splenomegaly noted. Musculoskeletal:no cyanosis of digits and no clubbing  NEURO: alert & oriented x 3 with fluent speech, no focal motor/sensory deficits  LABORATORY DATA:  I have reviewed the data as listed CMP Latest Ref Rng & Units 08/11/2018 08/10/2018 08/09/2018  Glucose 70 - 99 mg/dL 130(H) 137(H) 144(H)  BUN 8 - 23 mg/dL 43(H) 48(H) 49(H)  Creatinine 0.61 - 1.24 mg/dL 1.04 1.23 1.35(H)  Sodium 135 - 145 mmol/L 141 139 138  Potassium 3.5 - 5.1 mmol/L 4.6 4.6 4.9  Chloride 98 - 111 mmol/L 110 112(H) 114(H)  CO2 22 - 32 mmol/L 23 21(L) 17(L)  Calcium 8.9 - 10.3 mg/dL 7.4(L) 7.2(L) 7.0(L)  Total Protein 6.5 - 8.1 g/dL 4.0(L) 3.8(L) 3.8(L)  Total Bilirubin 0.3 - 1.2 mg/dL 1.5(H) 1.5(H) 2.1(H)  Alkaline Phos 38 - 126 U/L 160(H) 163(H) 164(H)  AST 15 - 41 U/L 34 38 47(H)  ALT 0 - 44 U/L _0 Lab Results  Component Value Date   WBC 0.4 (LL) 08/11/2018   HGB 7.9 (L) 08/11/2018   HCT 24.6 (L) 08/11/2018   MCV 83.7 08/11/2018   PLT 6 (LL) 08/11/2018   NEUTROABS 0.2 (L) 08/11/2018    ASSESSMENT AND PLAN: This is an 84 year old male with   1.  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  Splenomegaly 3.  Enlarged mediastinal lymph nodes 4.  Community-acquired pneumonia 5.  Acute renal failure 6.  Hypoalbuminemia 7.  Hyperuricemia  -  Bone marrow biopsy shows small abnormal B-cell population identified.  Received 1 dose of Rituxan on 08/07/2018.  Tolerated the infusion well.  Case was reviewed in the hematology conference and recommendation was for weekly Rituxan.  We will plan to repeat a dose on 08/14/2018. -Continue dexamethasone 4 mg daily for severe thrombocytopenia which could help both  lymphoma and thrombocytopenia.  Will administer IVIG 1 g/kg on 08/11/2018 and again on 08/12/2018.  Adverse effects of this medication were discussed with the patient including but not limited to possible infusion reaction.  He will be premedicated with Tylenol and Benadryl. The patient is agreeable to proceeding with this medication. -Platelet count is down to 6000 today.  He denies active bleeding.  Will administer 1 unit of platelets today due to platelet count less than 10,000. -Continue to monitor white blood cell count and hemoglobin. 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  Could be causing renal dysfunction.  Status post 1 dose of rasburicase 6 mg on 08/04/2018 with significant drop in uric acid level.  Continue allopurinol. -Renal function remained stable. -Goals of care: Prolongation of life and improvement of symptoms.  The patient does not wish to be intubated or resuscitated if his heart were to stop.  Mikey Bussing, DNP, AGPCNP-BC, AOCNP  Attending Note  I personally saw the patient, reviewed the chart and examined the patient. The plan of care was discussed with the patient 1.  Lymphoproliferative disorder with ITP: We presented his case in the hematology tumor board and the recommendation was to give him IVIG.  Also to repeat Rituxan weekly x4 2.  I will order Rituxan for this coming Friday. Patient will receive IVIG today and tomorrow. 3.  Transfuse platelets today. Unfortunately we have not seen the platelet count increase likely expected with Rituxan. I will place orders for Rituxan for this Friday.

## 2018-08-12 ENCOUNTER — Encounter (HOSPITAL_COMMUNITY): Payer: Self-pay | Admitting: Hematology and Oncology

## 2018-08-12 LAB — CBC WITH DIFFERENTIAL/PLATELET
Abs Immature Granulocytes: 0 10*3/uL (ref 0.00–0.07)
Basophils Absolute: 0 10*3/uL (ref 0.0–0.1)
Basophils Relative: 0 %
Eosinophils Absolute: 0 10*3/uL (ref 0.0–0.5)
Eosinophils Relative: 0 %
HCT: 22.4 % — ABNORMAL LOW (ref 39.0–52.0)
Hemoglobin: 7 g/dL — ABNORMAL LOW (ref 13.0–17.0)
Immature Granulocytes: 0 %
Lymphocytes Relative: 41 %
Lymphs Abs: 0.1 10*3/uL — ABNORMAL LOW (ref 0.7–4.0)
MCH: 26.6 pg (ref 26.0–34.0)
MCHC: 31.3 g/dL (ref 30.0–36.0)
MCV: 85.2 fL (ref 80.0–100.0)
Monocytes Absolute: 0 10*3/uL — ABNORMAL LOW (ref 0.1–1.0)
Monocytes Relative: 4 %
Neutro Abs: 0.2 10*3/uL — ABNORMAL LOW (ref 1.7–7.7)
Neutrophils Relative %: 55 %
Platelets: 9 10*3/uL — CL (ref 150–400)
RBC: 2.63 MIL/uL — ABNORMAL LOW (ref 4.22–5.81)
RDW: 17.8 % — ABNORMAL HIGH (ref 11.5–15.5)
WBC: 0.3 10*3/uL — CL (ref 4.0–10.5)
nRBC: 0 % (ref 0.0–0.2)

## 2018-08-12 LAB — PREPARE PLATELET PHERESIS: Unit division: 0

## 2018-08-12 LAB — COMPREHENSIVE METABOLIC PANEL
ALT: 23 U/L (ref 0–44)
AST: 32 U/L (ref 15–41)
Albumin: 1.8 g/dL — ABNORMAL LOW (ref 3.5–5.0)
Alkaline Phosphatase: 141 U/L — ABNORMAL HIGH (ref 38–126)
Anion gap: 7 (ref 5–15)
BUN: 39 mg/dL — ABNORMAL HIGH (ref 8–23)
CO2: 22 mmol/L (ref 22–32)
Calcium: 7.5 mg/dL — ABNORMAL LOW (ref 8.9–10.3)
Chloride: 111 mmol/L (ref 98–111)
Creatinine, Ser: 1.12 mg/dL (ref 0.61–1.24)
GFR calc Af Amer: >60 mL/min (ref >60)
GFR calc non Af Amer: >60 mL/min (ref >60)
Glucose, Bld: 118 mg/dL — ABNORMAL HIGH (ref 70–99)
Potassium: 3.9 mmol/L (ref 3.5–5.1)
Sodium: 140 mmol/L (ref 135–145)
Total Bilirubin: 1.4 mg/dL — ABNORMAL HIGH (ref 0.3–1.2)
Total Protein: 5.2 g/dL — ABNORMAL LOW (ref 6.5–8.1)

## 2018-08-12 LAB — BPAM PLATELET PHERESIS
Blood Product Expiration Date: 202005192359
ISSUE DATE / TIME: 202005191224
Unit Type and Rh: 2800

## 2018-08-12 LAB — URIC ACID: Uric Acid, Serum: 3.2 mg/dL — ABNORMAL LOW (ref 3.7–8.6)

## 2018-08-12 MED ORDER — IMMUNE GLOBULIN (HUMAN) 20 GM/200ML IV SOLN
20.0000 g | Freq: Three times a day (TID) | INTRAVENOUS | Status: AC
  Administered 2018-08-12 – 2018-08-13 (×3): 20 g via INTRAVENOUS
  Filled 2018-08-12 (×4): qty 200

## 2018-08-12 MED ORDER — SODIUM CHLORIDE 0.9% IV SOLUTION
Freq: Once | INTRAVENOUS | Status: AC
  Administered 2018-08-13: 01:00:00 via INTRAVENOUS

## 2018-08-12 MED ORDER — FUROSEMIDE 40 MG PO TABS: 40.0000 mg | ORAL_TABLET | Freq: Every day | ORAL | Status: DC

## 2018-08-12 MED ORDER — ACETAMINOPHEN 325 MG PO TABS
650.0000 mg | ORAL_TABLET | Freq: Three times a day (TID) | ORAL | Status: DC
  Administered 2018-08-12 (×2): 650 mg via ORAL
  Filled 2018-08-12 (×3): qty 2

## 2018-08-12 MED ORDER — DIPHENHYDRAMINE HCL 25 MG PO CAPS
25.0000 mg | ORAL_CAPSULE | Freq: Once | ORAL | Status: AC
  Administered 2018-08-13: 25 mg via ORAL
  Filled 2018-08-12: qty 1

## 2018-08-12 MED ORDER — DIPHENHYDRAMINE HCL 25 MG PO CAPS
25.0000 mg | ORAL_CAPSULE | Freq: Three times a day (TID) | ORAL | Status: DC
  Administered 2018-08-12 – 2018-08-13 (×3): 25 mg via ORAL
  Filled 2018-08-12 (×4): qty 1

## 2018-08-12 MED ORDER — FUROSEMIDE 10 MG/ML IJ SOLN
20.0000 mg | Freq: Once | INTRAMUSCULAR | Status: AC
  Administered 2018-08-13: 20 mg via INTRAVENOUS

## 2018-08-12 MED ORDER — FUROSEMIDE 10 MG/ML IJ SOLN
20.0000 mg | Freq: Once | INTRAMUSCULAR | Status: AC
  Administered 2018-08-13: 20 mg via INTRAVENOUS
  Filled 2018-08-12: qty 2

## 2018-08-12 MED ORDER — ACETAMINOPHEN 325 MG PO TABS
650.0000 mg | ORAL_TABLET | Freq: Once | ORAL | Status: AC
  Administered 2018-08-13: 650 mg via ORAL

## 2018-08-12 NOTE — Progress Notes (Addendum)
HEMATOLOGY-ONCOLOGY PROGRESS NOTE  SUBJECTIVE:  IVIG not hung until 10 pm last night. Still infusing. AM labs not drawn yet. Has ongoing fatigue.  Denies bleeding this morning. Has multiple bruises to his arms. Abdominal pain resolved. Has been ambulating with PT. No other complaints this morning.   REVIEW OF SYSTEMS:   Constitutional: Denies fevers, chills.  Reports progressive fatigue and weakness.  Reports night sweats. Eyes: Denies blurriness of vision Ears, nose, mouth, throat, and face: Denies mucositis or sore throat Respiratory: Reports productive cough and shortness of breath. cardiovascular: Denies palpitation, chest discomfort Gastrointestinal: Denies abdominal pain, nausea, vomiting.  Skin: Denies abnormal skin rashes Lymphatics: Denies new lymphadenopathy.  Bruises easily. Neurological:Denies numbness, tingling or new weaknesses Behavioral/Psych: Mood is stable, no new changes  Extremities: Reports lower extremity edema. All other systems were reviewed with the patient and are negative.  I have reviewed the past medical history, past surgical history, social history and family history with the patient and they are unchanged from previous note.   PHYSICAL EXAMINATION: ECOG PERFORMANCE STATUS: 2 - Symptomatic, <50% confined to bed  Vitals:   08/12/18 0956 08/12/18 1100  BP: (!) 98/58 (!) 83/56  Pulse: 86 82  Resp: 20 20  Temp: 98.1 F (36.7 C) 98.1 F (36.7 C)  SpO2: 100% 100%   Filed Weights   08/08/18 2054 08/10/18 2158 08/11/18 2106  Weight: 184 lb 11.9 oz (83.8 kg) 185 lb 1.6 oz (84 kg) 177 lb 14.4 oz (80.7 kg)    GENERAL:alert, no distress and comfortable SKIN: skin color, texture, turgor are normal, no rashes or significant lesions. Multiple bruises noted over bilateral arms.  EYES: normal, Conjunctiva are pink and non-injected, sclera clear OROPHARYNX:no exudate, no erythema and lips, buccal mucosa, and tongue normal  NECK: supple, thyroid normal size,  non-tender, without nodularity LYMPH:  no palpable lymphadenopathy in the cervical, axillary or inguinal LUNGS: Diminished breath sounds bilaterally HEART: regular rate & rhythm and no murmurs.  1+ bilateral lower extremity edema. Arms are edematous.  ABDOMEN: Normal bowel sounds, abdomen soft, diffuse tenderness with light palpation.  Splenomegaly noted. Musculoskeletal:no cyanosis of digits and no clubbing  NEURO: alert & oriented x 3 with fluent speech, no focal motor/sensory deficits  LABORATORY DATA:  I have reviewed the data as listed CMP Latest Ref Rng & Units 08/11/2018 08/10/2018 08/09/2018  Glucose 70 - 99 mg/dL 130(H) 137(H) 144(H)  BUN 8 - 23 mg/dL 43(H) 48(H) 49(H)  Creatinine 0.61 - 1.24 mg/dL 1.04 1.23 1.35(H)  Sodium 135 - 145 mmol/L 141 139 138  Potassium 3.5 - 5.1 mmol/L 4.6 4.6 4.9  Chloride 98 - 111 mmol/L 110 112(H) 114(H)  CO2 22 - 32 mmol/L 23 21(L) 17(L)  Calcium 8.9 - 10.3 mg/dL 7.4(L) 7.2(L) 7.0(L)  Total Protein 6.5 - 8.1 g/dL 4.0(L) 3.8(L) 3.8(L)  Total Bilirubin 0.3 - 1.2 mg/dL 1.5(H) 1.5(H) 2.1(H)  Alkaline Phos 38 - 126 U/L 160(H) 163(H) 164(H)  AST 15 - 41 U/L 34 38 47(H)  ALT 0 - 44 U/L _0 Lab Results  Component Value Date   WBC 0.4 (LL) 08/11/2018   HGB 7.9 (L) 08/11/2018   HCT 24.6 (L) 08/11/2018   MCV 83.7 08/11/2018   PLT 6 (LL) 08/11/2018   NEUTROABS 0.2 (L) 08/11/2018    ASSESSMENT AND PLAN: This is an 84 year old male with   1.  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  Splenomegaly 3.  Enlarged mediastinal lymph nodes 4.  Community-acquired pneumonia 5.  Acute renal failure 6.  Hypoalbuminemia 7.  Hyperuricemia  -Bone marrow biopsy shows small abnormal B-cell population identified.  Received 1 dose of Rituxan on 08/07/2018.  Tolerated the infusion well.  Case was reviewed in the hematology conference and recommendation was for weekly Rituxan.  We will plan to repeat a dose on 08/14/2018. -Continue  dexamethasone 4 mg daily for severe thrombocytopenia which could help both lymphoma and thrombocytopenia.  Will administer IVIG 1 g/kg on 08/11/2018 and again on 08/12/2018. He will be premedicated with Tylenol and Benadryl. The patient is agreeable to proceeding with this medication. -Platelet count not drawn yet today.  He denies active bleeding. Recommend platelet transfusion for platelet count less than 10,000. -Continue to monitor white blood cell count and hemoglobin. 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  Could be causing renal dysfunction.  Status post 1 dose of rasburicase 6 mg on 08/04/2018 with significant drop in uric acid level.  Continue allopurinol. -Renal function remains stable as of 08/11/2018. Labs not drawn yet today. -Goals of care: Prolongation of life and improvement of symptoms.  The patient does not wish to be intubated or resuscitated if his heart were to stop.  Mikey Bussing, DNP, AGPCNP-BC, AOCNP  Attending Note  I personally saw the patient, reviewed the chart and examined the patient. The plan of care was discussed with the patient and the admitting team. 1.  Persistent pancytopenia: The bone marrow biopsy and cytogenetics and FISH studies did not reveal any Chromosomal defects.  There was not enough dysplasia to call at [facility].  We cannot even call it aplastic anemia. Either way the treatment for his condition is with IVIG and rituximab.  I discussed with Dr. Verlon Au who will give him a unit of PRBC and 1 unit of platelets.  He will receive the second dose of IVIG between today and tomorrow and Friday he gets Rituxan. I discussed with him that if our efforts do not bear fruit then we may have to consider hospice care for progressive bone marrow failure.  Patient appears to understand this very well.  We will wait to see what happens through this weekend and make a final decision early next week.

## 2018-08-12 NOTE — Progress Notes (Signed)
Physical Therapy Treatment Patient Details Name: Zachary Bennett MRN: [id number] DOB: 08-Sep-1934 Today's Date: 08/12/2018    History of Present Illness 84 y.o. male admitted with generalized weakness SOB, found to have CAP and AKI, abdominal pain secondary to splenomegaly. Pt currently awaiting PET scan for prior concern of lymphoma.     PT Comments    Patient doing well with therapy today, ambulating around room and into bathroom for BM. Assisted in pericare. SpO2 98%, HR 98-102 on RA this visit.    Follow Up Recommendations  Home health PT;Supervision/Assistance - 24 hour(and Sons physical support)     Equipment Recommendations  Rolling walker with 5" wheels;3in1 (PT)    Recommendations for Other Services       Precautions / Restrictions Precautions Precautions: Fall    Mobility  Bed Mobility Overal bed mobility: Needs Assistance Bed Mobility: Supine to Sit     Supine to sit: Min assist     General bed mobility comments: Min assist to pull to sit  Transfers   Equipment used: Rolling walker (2 wheeled) Transfers: Sit to/from Stand Sit to Stand: Min guard         General transfer comment: Minguard for safety  Ambulation/Gait Ambulation/Gait assistance: Min guard Gait Distance (Feet): 15 Feet Assistive device: Rolling walker (2 wheeled) Gait Pattern/deviations: Step-through pattern;Decreased stride length;Decreased step length - right;Decreased step length - left Gait velocity: decreased   General Gait Details: use of RW, to bathroom and back. noted low plat on last lab, limited activity to prevent risk of fall or injury.    Stairs             Wheelchair Mobility    Modified Rankin (Stroke Patients Only)       Balance Overall balance assessment: Needs assistance   Sitting balance-Leahy Scale: Fair       Standing balance-Leahy Scale: Poor                              Cognition Arousal/Alertness: Awake/alert Behavior During  Therapy: WFL for tasks assessed/performed Overall Cognitive Status: Within Functional Limits for tasks assessed                                        Exercises      General Comments        Pertinent Vitals/Pain      Home Living                      Prior Function            PT Goals (current goals can now be found in the care plan section) Acute Rehab PT Goals Patient Stated Goal: go home with family PT Goal Formulation: With patient Potential to Achieve Goals: Fair Progress towards PT goals: Progressing toward goals    Frequency    Min 3X/week      PT Plan Current plan remains appropriate    Co-evaluation              AM-PAC PT "6 Clicks" Mobility   Outcome Measure  Help needed turning from your back to your side while in a flat bed without using bedrails?: None Help needed moving from lying on your back to sitting on the side of a flat bed without using bedrails?: None Help needed moving to and from  a bed to a chair (including a wheelchair)?: A Little Help needed standing up from a chair using your arms (e.g., wheelchair or bedside chair)?: A Little Help needed to walk in hospital room?: A Little Help needed climbing 3-5 steps with a railing? : A Lot 6 Click Score: 19    End of Session Equipment Utilized During Treatment: Gait belt Activity Tolerance: Patient tolerated treatment well Patient left: in bed Nurse Communication: Mobility status PT Visit Diagnosis: Unsteadiness on feet (R26.81)     Time: 2411-4643 PT Time Calculation (min) (ACUTE ONLY): 20 min  Charges:  $Gait Training: 8-22 mins                     Reinaldo Berber, PT, DPT Acute Rehabilitation Services Pager: [phone] Office: [place] 08/12/2018, 9:45 AM

## 2018-08-12 NOTE — Progress Notes (Signed)
Samtani, MD ordered blood and platelets to be transfused. I informed MD that patient was currently getting IVIG for 8 hours. MD stated that it was ok to finish transfusing IVIG, then hang platelets, and then blood. Will update night RN of this information.

## 2018-08-12 NOTE — Progress Notes (Signed)
Patient with order for 85gm IVIG q24h x 2 doses. Normal protocol includes titrating the rate up to 480mg /kg/hr if tolerated by patient. Bag hung at 1747 on 5/19 per MAR. Rate still 97ml/hr (30mg /kg/hr) at 1130 this AM. Tried to titrate rate to 44ml/hr, however patient's BP decreased to 83/56 and was subsequently decreased back to 32ml/hr.   Aggregate bags of IVIG expire 8 hours after the start of the infusion. Current bag was taken down after ~50gm infused. D/W APP for Heme/Onc and with primary MD. Will have to split IVIG into 20gm doses q8h x 4 doses at current rate of 58ml/hr to achieve infusion and avoid product expiration. Orders entered.   Elaine Middleton A. Levada Dy, PharmD, [place] Pager: [phone] Please utilize Amion for appropriate phone number to reach the unit pharmacist ([place])

## 2018-08-12 NOTE — Progress Notes (Signed)
PROGRESS NOTE  Zachary Bennett YSA:[id number] DOB: [birth year] DOA: 08/03/2018 PCP: Nicholas Lose, MD   LOS: 8 days   Brief narrative:  84 y.o. White ?  thrombocytopenia and hyperlipidemia; who presented with complaints of abdominal pain and worsening shortness of breath-  admitted to the hospital from 4/29-5/2 after being found to have splenomegaly with pancytopenia with imaging concerning for lymphoma.  Patient was evaluated by Dr. Lindi Adie and ultimately the plan had been for an outpatient PET scan for targeted biopsy of a hypermetabolic site.   PET scan was not scheduled until the 19th of this month.  Re-presented with anorexia, fever, chills to emergency room on 5/11  afebrile, pulse 92-100, respirations 18-24, blood pressure 99/58-108/62, and O2 saturation maintained on room air. Patient was placed on 2 L nasal cannula oxygen for comfort.   Labs revealed WBC 1.9, platelets 18, sodium 133, CO2 16, BUN 73, creatinine 2.32, calcium 7.8, alkaline phosphatase 233, albumin 2.5, lipase 68, AST 66 , and total bilirubin 5.9.    Chest x-ray showed right lower lobe infiltrate concerning for possible pneumonia.  Patient was given 1 L normal saline IV fluids, vancomycin, and cefepime.  Patient was then considered for admission to the hospital.  Subjective: Weak alert but having bruising now on his left arm as well No fever no chills No dark or tarry stool No nosebleed Still feels very weak when he lays flat he is short of breath He was able to get up to the bedside with occupational physical therapy but felt winded He has never had in the past and oxygen requirement but now does  Assessment/Plan:  Principal Problem:   ARF (acute renal failure) ([place]) Active Problems:   Thrombocytopenia ([facility])   Elevated LFTs   Hyperbilirubinemia   Splenomegaly   CAP (community acquired pneumonia)   Dyspnea   Prolonged QT interval  Pancytopenia; 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 Lindi Adie following, appreciate assistance.  No active bleeding at this time.   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 Mare Loan 1 packet of platelets in addition to 1 unit of blood today  Hyperuricemia Uric acid level 10.9 on admission. --s/p rasburicase 6 mg IV on 08/04/2018; uric acid currently 3.2 --Continue allopurinol --Continue to follow daily uric acid  --Suspect might be able to lower dose of rasburicase going forward  Abdominal pain, secondary to splenomegaly:  Patient noted to have previous CT scan showing severe splenomegaly pretracheal lymph nodes and subcarinal lymph enlargement from 4/29. Oncology on board and had recommended PET scan which is scheduled for 5/19, to evaluate most hypermetabolic area for biopsy.   --Oncology d/w with general surgery regarding possible need for splenectomy, defer for now --Oncology to discuss pathology at tumor board this week  --continue oxycodone/fentanyl as needed for abdominal pain.   Acute kidney injury: resolved Etiology likely secondary to volume depletion with poor oral intake prior to hospitalization. likely secondary to volume depletion from poor oral intake.  Also consideration of hyperuricemia. Creatinine on admission 2.32. --continue with IV fluid hydration.   --Creatinine improving 2.32-->1.68-->1.39-->1.18-->1.35-->1.23-->1.04 (baseline 1.25 06/2018) --Avoid nephrotoxins, renally dose all medications --daily BMP  Dyspnea likely secondary to community-acquired pneumonia.   On Supplemental oxygen.  Will wean oxygen as able.  Chest x-ray showing signs of a ill-defined  airspace opacity in the right medial lung base concerning for possible  pneumonia.    Patient does have a large mediastinal lymphadenopathy as well.  Afebrile admission with a white blood cell count of 1.5. --COVID-19 negative --Urine Legionella antigen negative --MRSA PCR negative, discontinue vancomycin today  --s/p 7-day course of cefepime --Blood cultures no growth x 5 days --Titrate supplemental oxygen to maintain SPO2 greater than 92% --Incentive spirometry --Continue supportive care.   Elevated bilirubin/elevated LFTs:  Bilirubin level has slightly improved.. Patient previously noted to have cholelithiasis without concerning symptoms for acute cholecystitis during last hospitalization. May warrant reimaging if symptoms persist  Bilateral lower extremity edema, right upper extremity edema Ultrasound right upper extremity negative for DVT. -I have had to discontinue his IV Lasix because of hypotension on 5/20 --daily weights, strict I/O's --Encourage mobilization  Prolonged QT interval:  QTc503 on admission.  --avoid QT prolonging medications  Hyperlipidemia. Continue to hold statin.  Tobacco abuse:  50-pack-year history.  Quit smoking 2 weeks back.  Emphasized to abstain from smoking.   VTE Prophylaxis: SCDs  Code Status: Full  Family Communication: none  Disposition Plan: continue inpatient hospitalization, pending flow cytometry, plan IVIG infusion over next two days,  further recommendations per medical oncology.  PT recommends home health PT with 3 in 1 bedside commode, rolling walker with 5 inch wheels.  Consultants:  Oncology  Interventional radiology  Procedures:  IR bone marrow biopsy 08/05/2018   IVIG infusion planned for 5/19 and 5/20  Antibiotics: Anti-infectives (From admission, onward)   Start     Dose/Rate Route Frequency Ordered Stop   08/05/18 1600  vancomycin (VANCOCIN) 1,250 mg in sodium chloride 0.9 % 250 mL IVPB  Status:  Discontinued     1,250 mg 166.7 mL/hr over 90 Minutes Intravenous Every 48 hours  08/03/18 1449 08/05/18 1248   08/05/18 1000  ceFEPIme (MAXIPIME) 2 g in sodium chloride 0.9 % 100 mL IVPB  Status:  Discontinued     2 g 200 mL/hr over 30 Minutes Intravenous Every 12 hours 08/05/18 0852 08/08/18 1010   08/04/18 1400  ceFEPIme (MAXIPIME) 2 g in sodium chloride 0.9 % 100 mL IVPB  Status:  Discontinued     2 g 200 mL/hr over 30 Minutes Intravenous Every 24 hours 08/03/18 1541 08/05/18 0852   08/03/18 1430  ceFEPIme (MAXIPIME) 2 g in sodium chloride 0.9 % 100 mL IVPB     2 g 200 mL/hr over 30 Minutes Intravenous  Once 08/03/18 1420 08/03/18 1512   08/03/18 1430  vancomycin (VANCOCIN) 1,500 mg in sodium chloride 0.9 % 500 mL IVPB     1,500 mg 250 mL/hr over 120 Minutes Intravenous  Once 08/03/18 1421 08/03/18 1744      Objective: Vitals:   08/11/18 2106 08/12/18 0453  BP: (!) 106/59 (!) 109/56  Pulse: 81 77  Resp: 17 19  Temp: 97.8 F (36.6 C) 98.1 F (36.7 C)  SpO2: 100% 100%    Intake/Output Summary (Last 24 hours) at 08/12/2018 0844 Last data filed at 08/12/2018 0500 Gross per 24 hour  Intake 1238 ml  Output 1080 ml  Net 158 ml   Filed Weights   08/08/18 2054 08/10/18 2158 08/11/18 2106  Weight: 83.8 kg 84 kg 80.7 kg   Body mass index is 27.05 kg/m.   Physical Exam:  Awake alert relatively ill-appearing Caucasian male no icterus pallor Edentulous Neck is soft supple no submandibular lymphadenopathy no thyromegaly no JVD S1-S2 no murmur rub or gallop On  monitors he is in sinus rhythm Abdomen is soft nontender no rebound Left forearm has bruising right forearm bruising is better No lower extremity bruises but grade 2 lower extremity edema which is pitting Euthymic congruent no focal deficit neurologically   Data Review: I have personally reviewed the following laboratory data and studies,  CBC: Recent Labs  Lab 08/07/18 0322 08/08/18 0355 08/09/18 0333 08/10/18 0346 08/11/18 0350  WBC 0.9* 0.6* 0.4* 0.4* 0.4*  NEUTROABS 0.6* 0.5* 0.4* 0.3*  0.2*  HGB 9.7* 8.7* 8.7* 7.9* 7.9*  HCT 30.4* 27.3* 27.3* 25.0* 24.6*  MCV 85.4 86.4 83.7 84.2 83.7  PLT 10* <5* 8* 11* 6*   Basic Metabolic Panel: Recent Labs  Lab 08/07/18 0322 08/08/18 0355 08/09/18 0333 08/10/18 0346 08/11/18 0350  NA 137 138 138 139 141  K 4.5 4.9 4.9 4.6 4.6  CL 115* 112* 114* 112* 110  CO2 17* 17* 17* 21* 23  GLUCOSE 117* 103* 144* 137* 130*  BUN 39* 45* 49* 48* 43*  CREATININE 1.18 1.35* 1.35* 1.23 1.04  CALCIUM 6.7* 6.7* 7.0* 7.2* 7.4*  MG  --   --   --  2.6*  --   PHOS  --  3.9  --   --   --    Liver Function Tests: Recent Labs  Lab 08/06/18 0449 08/08/18 0355 08/09/18 0333 08/10/18 0346 08/11/18 0350  AST 63* 72* 47* 38 34  ALT _0 ALKPHOS 188* 183* 164* 163* 160*  BILITOT 3.3* 3.3* 2.1* 1.5* 1.5*  PROT 4.0* 3.4* 3.8* 3.8* 4.0*  ALBUMIN 1.8* 1.5* 1.6* 1.7* 1.8*   No results for input(s): LIPASE, AMYLASE in the last 168 hours. No results for input(s): AMMONIA in the last 168 hours. Cardiac Enzymes: No results for input(s): CKTOTAL, CKMB, CKMBINDEX, TROPONINI in the last 168 hours. BNP (last 3 results) Recent Labs    08/03/18 1303  BNP 73.0    ProBNP (last 3 results) No results for input(s): PROBNP in the last 8760 hours.  CBG: No results for input(s): GLUCAP in the last 168 hours.    Studies: No results found.  Scheduled Meds: . sodium chloride   Intravenous Once  . sodium chloride   Intravenous Once  . acetaminophen  650 mg Oral Daily  . [START ON 08/14/2018] acetaminophen  650 mg Oral Once  . allopurinol  100 mg Oral BID  . dexamethasone  4 mg Intravenous Q24H  . diphenhydrAMINE  25 mg Oral Daily  . [START ON 08/14/2018] diphenhydrAMINE  25 mg Oral Once  . docusate sodium  100 mg Oral BID  . furosemide  40 mg Intravenous Daily  . guaiFENesin  600 mg Oral BID  . [START ON 08/14/2018] riTUXimab (RITUXAN) IV infusion  375 mg/m2 Intravenous Once  . zolpidem  5 mg Oral QHS    Continuous Infusions: . IMMUNE  GLOBULIN 10% (HUMAN) IV - For Fluid Restriction Only 50.4 mL/hr at 08/11/18 1947    Verneita Griffes, MD [facility] 3:49 PM

## 2018-08-12 NOTE — Progress Notes (Signed)
Per pharmacy, patients IVIG rate was supposed to be greater than 25 ml/hr. Pharmacy asked that we advance rate up to 50 ml/hr. IVIG rate advanced to 50 ml/hr and vital signs obtained 15 minutes later. B/P 83/56. Pharmacist notified. Dwayne, Pharmacist stated to turn rate back to 25 ml/hr. Orders followed. Will continue to monitor patient.

## 2018-08-12 NOTE — Progress Notes (Signed)
Occupational Therapy Treatment Patient Details Name: Zachary Bennett MRN: [id number] DOB: Nov 07, 1934 Today's Date: 08/12/2018    History of present illness 84 y.o. male admitted with generalized weakness SOB, found to have CAP and AKI, abdominal pain secondary to splenomegaly. Pt currently awaiting PET scan for prior concern of lymphoma.    OT comments  Pt slowly progressing toward OT goals. Pt agreeable to therapy, however, significant low platelet and hemoglobin level. Pt engaged in grooming while seated in chair with set up. AROM of UB, and functional mobility to bed with Min A and use of RW. Pt will benefit from continued OT to address energy conservation techniques, ADL modifications, and functional mobility. DC and freq remains appropriate. OT will continue to follow acutely.     Follow Up Recommendations  Home health OT;Supervision/Assistance - 24 hour    Equipment Recommendations  3 in 1 bedside commode    Recommendations for Other Services PT consult    Precautions / Restrictions Precautions Precautions: Fall Restrictions Weight Bearing Restrictions: No       Mobility Bed Mobility Overal bed mobility: Needs Assistance             General bed mobility comments: Pt seated at EOB at end of session and left with MD.   Transfers Overall transfer level: Needs assistance Equipment used: Rolling walker (2 wheeled) Transfers: Sit to/from Stand Sit to Stand: Min assist         General transfer comment: Min A for safety    Balance Overall balance assessment: Needs assistance Sitting-balance support: No upper extremity supported Sitting balance-Leahy Scale: Fair     Standing balance support: No upper extremity supported;Bilateral upper extremity supported Standing balance-Leahy Scale: Poor Standing balance comment: RW utilized due to fatigue and weakness during functional mobility to EOB.                            ADL either performed or assessed  with clinical judgement   ADL Overall ADL's : Needs assistance/impaired     Grooming: Standing;Set up;Sitting Grooming Details (indicate cue type and reason): pt completed grooming task while sitting in recliner with set up. Pt chart report low platelet and hemoglobin level. Therapist had to use symptom based approach to determine therapy session.                 Toilet Transfer: Minimal assistance;RW;Ambulation Armed forces technical officer Details (indicate cue type and reason): simulated sit<>stand from recliner to EOB with in room ambulation. Pt left with MD.          Functional mobility during ADLs: Minimal assistance General ADL Comments: pt required minA for mobility for safety and activty tolerance.     Vision       Perception     Praxis      Cognition Arousal/Alertness: Awake/alert Behavior During Therapy: WFL for tasks assessed/performed Overall Cognitive Status: Within Functional Limits for tasks assessed                                 General Comments: Pt appeared agitated at start of session but progressed to being agreeable to session. Pt educated on energy conservation technique due to SOB and O2 sat.         Exercises Exercises: General Upper Extremity General Exercises - Upper Extremity Shoulder Flexion: 10 reps;Both;AROM;Seated Shoulder Extension: 10 reps;Both;AROM;Seated Elbow Flexion: 10 reps;Both;AROM;Seated Elbow Extension: Both;10 reps  Shoulder Instructions       General Comments pt with nasal cannula SpO2 99% in sitting, sitting during ADL task SpO2 95%, after functional mobility 85%. Pt instructed to use pursed lip breathing to elevate SpO2 level.    Pertinent Vitals/ Pain       Pain Assessment: Faces Faces Pain Scale: Hurts little more Pain Location: stomach Pain Descriptors / Indicators: Aching Pain Intervention(s): Limited activity within patient's tolerance  Home Living                                           Prior Functioning/Environment              Frequency  Min 2X/week        Progress Toward Goals  OT Goals(current goals can now be found in the care plan section)  Progress towards OT goals: Progressing toward goals  Acute Rehab OT Goals Patient Stated Goal: go home with family OT Goal Formulation: With patient Time For Goal Achievement: 08/22/18 Potential to Achieve Goals: Good ADL Goals Pt Will Perform Grooming: Independently Pt Will Perform Lower Body Dressing: Independently Pt Will Transfer to Toilet: Independently;ambulating Pt Will Perform Tub/Shower Transfer: Independently  Plan Discharge plan remains appropriate;Frequency remains appropriate    Co-evaluation                 AM-PAC OT "6 Clicks" Daily Activity     Outcome Measure   Help from another person eating meals?: None Help from another person taking care of personal grooming?: A Little Help from another person toileting, which includes using toliet, bedpan, or urinal?: A Little Help from another person bathing (including washing, rinsing, drying)?: A Little Help from another person to put on and taking off regular upper body clothing?: A Little Help from another person to put on and taking off regular lower body clothing?: A Little 6 Click Score: 19    End of Session Equipment Utilized During Treatment: Oxygen;Rolling walker;Gait belt  OT Visit Diagnosis: Unsteadiness on feet (R26.81);Other abnormalities of gait and mobility (R26.89);Muscle weakness (generalized) (M62.81)   Activity Tolerance Patient limited by fatigue   Patient Left in bed;with call bell/phone within reach;with bed alarm set;with nursing/sitter in room(Left with MD in room)   Nurse Communication Mobility status;Other (comment)(SpO2 status)        Time: 1350-1420 OT Time Calculation (min): 30 min  Charges: OT General Charges $OT Visit: 1 Visit OT Treatments $Self Care/Home Management : 23-37 mins  Zachary Bennett, MSOT, OTR/L  [facility]  [phone]   Zachary Bennett 08/12/2018, 2:35 PM

## 2018-08-12 NOTE — Care Management Important Message (Signed)
Important Message  Patient Details  Name: Zachary Bennett MRN: [id number] Date of Birth: 1934-04-21   Medicare Important Message Given:  Yes    Anaija Wissink [address] 08/12/2018, 8:56 AM

## 2018-08-12 NOTE — Progress Notes (Signed)
Patient receiving immune globulin. Labs will be drawn post med.

## 2018-08-13 ENCOUNTER — Inpatient Hospital Stay: Payer: Medicare Other | Attending: Hematology and Oncology | Admitting: Hematology and Oncology

## 2018-08-13 LAB — HEMOGLOBIN AND HEMATOCRIT, BLOOD
HCT: 23.8 % — ABNORMAL LOW (ref 39.0–52.0)
Hemoglobin: 7.6 g/dL — ABNORMAL LOW (ref 13.0–17.0)

## 2018-08-13 LAB — PATHOLOGIST SMEAR REVIEW

## 2018-08-13 LAB — PREPARE RBC (CROSSMATCH)

## 2018-08-13 MED ORDER — ACETAMINOPHEN 325 MG PO TABS
650.0000 mg | ORAL_TABLET | Freq: Three times a day (TID) | ORAL | Status: AC
  Administered 2018-08-13 – 2018-08-14 (×2): 650 mg via ORAL
  Filled 2018-08-13 (×2): qty 2

## 2018-08-13 MED ORDER — ACETAMINOPHEN 325 MG PO TABS
650.0000 mg | ORAL_TABLET | Freq: Once | ORAL | Status: AC
  Administered 2018-08-14: 650 mg via ORAL
  Filled 2018-08-13: qty 2

## 2018-08-13 MED ORDER — IMMUNE GLOBULIN (HUMAN) 20 GM/200ML IV SOLN
20.0000 g | Freq: Three times a day (TID) | INTRAVENOUS | Status: AC
  Administered 2018-08-13 – 2018-08-14 (×2): 20 g via INTRAVENOUS
  Filled 2018-08-13 (×4): qty 200

## 2018-08-13 MED ORDER — DIPHENHYDRAMINE HCL 25 MG PO CAPS
25.0000 mg | ORAL_CAPSULE | Freq: Once | ORAL | Status: AC
  Administered 2018-08-14: 25 mg via ORAL
  Filled 2018-08-13: qty 1

## 2018-08-13 MED ORDER — ACETAMINOPHEN 325 MG PO TABS: 650.0000 mg | ORAL_TABLET | Freq: Once | ORAL | Status: DC

## 2018-08-13 MED ORDER — DIPHENHYDRAMINE HCL 25 MG PO CAPS: 25.0000 mg | ORAL_CAPSULE | Freq: Once | ORAL | Status: DC

## 2018-08-13 MED ORDER — DIPHENHYDRAMINE HCL 25 MG PO CAPS
25.0000 mg | ORAL_CAPSULE | Freq: Three times a day (TID) | ORAL | Status: AC
  Administered 2018-08-13 – 2018-08-14 (×2): 25 mg via ORAL
  Filled 2018-08-13 (×2): qty 1

## 2018-08-13 NOTE — Progress Notes (Signed)
Received a call from [facility]. It will take some time to prepare RBC due to antigens.

## 2018-08-13 NOTE — Progress Notes (Signed)
Called Blood Bank [facility] not ready at this time. Will call later.

## 2018-08-13 NOTE — Progress Notes (Signed)
PROGRESS NOTE  Zachary Bennett KWI:[id number] DOB: 06-16-1934 DOA: 08/03/2018 PCP: Nicholas Lose, MD   LOS: 9 days   Brief narrative:  84 y.o. White ?  thrombocytopenia and hyperlipidemia; who presented with complaints of abdominal pain and worsening shortness of breath-  admitted to the hospital from 4/29-5/2 after being found to have splenomegaly with pancytopenia with imaging concerning for lymphoma.  Patient was evaluated by Dr. Lindi Adie and ultimately the plan had been for an outpatient PET scan for targeted biopsy of a hypermetabolic site.   PET scan was not scheduled until the 19th of this month.  Re-presented with anorexia, fever, chills to emergency room on 5/11  afebrile, pulse 92-100, respirations 18-24, blood pressure 99/58-108/62, and O2 saturation maintained on room air. Patient was placed on 2 L nasal cannula oxygen for comfort.   Labs revealed WBC 1.9, platelets 18, sodium 133, CO2 16, BUN 73, creatinine 2.32, calcium 7.8, alkaline phosphatase 233, albumin 2.5, lipase 68, AST 66 , and total bilirubin 5.9.    Chest x-ray showed right lower lobe infiltrate concerning for possible pneumonia.  Patient was given 1 L normal saline IV fluids, vancomycin, and cefepime.  Patient was then considered for admission to the hospital.  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 Lindi Adie following, appreciate assistance.  No active bleeding at this time.   --Platelet count 14-->10-->10-->5-->8-->11-->6---lab pending today given late transfusion completion --Continue dexamethasone 4 mg IV  daily --Rituxan infusion on 08/07/2018 for ITP unresponsive to steroids --Platelet transfusion from 5/14 through 5/19 --Plan IVIG on 5/19 and 5/20-Rituxan on Friday 5/22 --Discussed personally with Dr. Lindi Adie 5/21-giving 1 packet of platelets in addition to 1 unit of blood 5/21 through 5/22 --further care as per Dr. Lindi Adie and oncology team--appreciate input  Hyperuricemia Uric acid level 10.9 on admission. --s/p rasburicase 6 mg IV on 08/04/2018 --Continue allopurinol -will rpt in am  Abdominal pain, secondary to splenomegaly:  Patient noted to have previous CT scan showing severe splenomegaly pretracheal lymph nodes and subcarinal lymph enlargement from 4/29. Oncology on board and had recommended PET scan which is scheduled for 5/19, to evaluate most hypermetabolic area for biopsy.   --Oncology d/w with general surgery regarding possible need for splenectomy, defer for now --continue oxycodone/fentanyl as needed for abdominal pain.   Acute kidney injury: resolved Etiology likely secondary to volume depletion with poor oral intake prior to hospitalization. likely secondary to volume depletion from poor oral intake.  Also consideration of hyperuricemia. Creatinine on admission 2.32. --continue with IV fluid hydration.   --Creatinine improving 2.32-->1.68-->1.39-->1.18-->1.35-->1.23-->1.04-->1.12 --Avoid nephrotoxins, renally dose all medications --daily BMP  Dyspnea likely secondary to community-acquired pneumonia.   On Supplemental oxygen.  Will wean oxygen as able.  Chest x-ray showing signs of a ill-defined airspace opacity in the right medial lung base concerning for possible pneumonia.    Patient does have a large mediastinal lymphadenopathy as well.  Afebrile admission with a white blood cell count of 1.5. --COVID-19 negative --Urine Legionella antigen negative --MRSA PCR negative, discontinue vancomycin today  --s/p 7-day course of cefepime --Blood cultures no growth x 5 days  --  Titrate supplemental oxygen to maintain SPO2 greater than 92%--desat screen when able --Incentive spirometry --Continue supportive care.   Elevated bilirubin/elevated LFTs:  Bilirubin level has slightly improved.. Patient previously noted to have cholelithiasis without concerning symptoms for acute cholecystitis during last hospitalization. May warrant reimaging if symptoms persist  Bilateral lower extremity edema, right upper extremity edema Ultrasound right upper extremity negative for DVT. -I have had to discontinue his IV Lasix because of hypotension on 5/20--likely related to IVIG infusions --daily weights, strict I/O's --Encourage mobilization  Prolonged QT interval:  QTc503 on admission.  --avoid QT prolonging medications-monitor on tele  Hyperlipidemia. Continue to hold statin.  Tobacco abuse:  50-pack-year history.  Quit smoking 2 weeks back.  Emphasized to abstain from smoking.   VTE Prophylaxis: SCDs  Code Status: Full  Family Communication: none  Disposition Plan: continue inpatient hospitalization--per my discussion with Dr. Lindi Adie, we will trial Rituxan again on 5/22 to see how his counts respond--if that doesn't work, then we will need to conisder palliative care input PT recommends home health PT with 3 in 1 bedside commode, rolling walker with 5 inch wheels.  Consultants:  Oncology  Interventional radiology  Procedures:  IR bone marrow biopsy 08/05/2018   IVIG infusion planned for 5/19 and 5/20  Antibiotics: Anti-infectives (From admission, onward)   Start     Dose/Rate Route Frequency Ordered Stop   08/05/18 1600  vancomycin (VANCOCIN) 1,250 mg in sodium chloride 0.9 % 250 mL IVPB  Status:  Discontinued     1,250 mg 166.7 mL/hr over 90 Minutes Intravenous Every 48 hours 08/03/18 1449 08/05/18 1248   08/05/18 1000  ceFEPIme (MAXIPIME) 2 g in sodium chloride 0.9 % 100 mL IVPB  Status:  Discontinued     2 g 200 mL/hr over 30 Minutes Intravenous  Every 12 hours 08/05/18 0852 08/08/18 1010   08/04/18 1400  ceFEPIme (MAXIPIME) 2 g in sodium chloride 0.9 % 100 mL IVPB  Status:  Discontinued     2 g 200 mL/hr over 30 Minutes Intravenous Every 24 hours 08/03/18 1541 08/05/18 0852   08/03/18 1430  ceFEPIme (MAXIPIME) 2 g in sodium chloride 0.9 % 100 mL IVPB     2 g 200 mL/hr over 30 Minutes Intravenous  Once 08/03/18 1420 08/03/18 1512   08/03/18 1430  vancomycin (VANCOCIN) 1,500 mg in sodium chloride 0.9 % 500 mL IVPB     1,500 mg 250 mL/hr over 120 Minutes Intravenous  Once 08/03/18 1421 08/03/18 1744      Objective: Vitals:   08/13/18 1159 08/13/18 1309  BP: (!) 106/51 (!) 107/54  Pulse: 88 84  Resp: 20   Temp: 98 F (36.7 C) 98.2 F (36.8 C)  SpO2: 100%     Intake/Output Summary (Last 24 hours) at 08/13/2018 1437 Last data filed at 08/13/2018 0956 Gross per 24 hour  Intake 1377.48 ml  Output 2550 ml  Net -1172.52 ml   Filed Weights   08/10/18 2158 08/11/18 2106 08/12/18 1440  Weight: 84 kg 80.7 kg 83.3 kg   Body mass index is 27.93 kg/m.   Physical Exam:  Awake alert pleasant seems a little flushed he says S1-S2 no murmur rub or gallop Abdomen soft nontender no rebound no guarding Chest clinically clear Grade 2 pitting lower extremity edema Neurologically able to move all 4 extremities without deficit Now off oxygen   Data Review: I have personally reviewed the following laboratory data and studies,  CBC: Recent Labs  Lab 08/08/18 0355 08/09/18 7371  08/10/18 0346 08/11/18 0350 08/12/18 1323  WBC 0.6* 0.4* 0.4* 0.4* 0.3*  NEUTROABS 0.5* 0.4* 0.3* 0.2* 0.2*  HGB 8.7* 8.7* 7.9* 7.9* 7.0*  HCT 27.3* 27.3* 25.0* 24.6* 22.4*  MCV 86.4 83.7 84.2 83.7 85.2  PLT <5* 8* 11* 6* 9*   Basic Metabolic Panel: Recent Labs  Lab 08/08/18 0355 08/09/18 0333 08/10/18 0346 08/11/18 0350 08/12/18 1323  NA 138 138 139 141 140  K 4.9 4.9 4.6 4.6 3.9  CL 112* 114* 112* 110 111  CO2 17* 17* 21* 23 22  GLUCOSE  103* 144* 137* 130* 118*  BUN 45* 49* 48* 43* 39*  CREATININE 1.35* 1.35* 1.23 1.04 1.12  CALCIUM 6.7* 7.0* 7.2* 7.4* 7.5*  MG  --   --  2.6*  --   --   PHOS 3.9  --   --   --   --    Liver Function Tests: Recent Labs  Lab 08/08/18 0355 08/09/18 0333 08/10/18 0346 08/11/18 0350 08/12/18 1323  AST 72* 47* 38 34 32  ALT 24 21 24 23 23   ALKPHOS 183* 164* 163* 160* 141*  BILITOT 3.3* 2.1* 1.5* 1.5* 1.4*  PROT 3.4* 3.8* 3.8* 4.0* 5.2*  ALBUMIN 1.5* 1.6* 1.7* 1.8* 1.8*   No results for input(s): LIPASE, AMYLASE in the last 168 hours. No results for input(s): AMMONIA in the last 168 hours. Cardiac Enzymes: No results for input(s): CKTOTAL, CKMB, CKMBINDEX, TROPONINI in the last 168 hours. BNP (last 3 results) Recent Labs    08/03/18 1303  BNP 73.0    ProBNP (last 3 results) No results for input(s): PROBNP in the last 8760 hours.  CBG: No results for input(s): GLUCAP in the last 168 hours.    Studies: No results found.  Scheduled Meds: . sodium chloride   Intravenous Once  . acetaminophen  650 mg Oral Q8H  . [START ON 08/14/2018] acetaminophen  650 mg Oral Once  . allopurinol  100 mg Oral BID  . dexamethasone  4 mg Intravenous Q24H  . diphenhydrAMINE  25 mg Oral Q8H  . [START ON 08/14/2018] diphenhydrAMINE  25 mg Oral Once  . docusate sodium  100 mg Oral BID  . guaiFENesin  600 mg Oral BID  . [START ON 08/14/2018] riTUXimab (RITUXAN) IV infusion  375 mg/m2 Intravenous Once  . zolpidem  5 mg Oral QHS    Continuous Infusions: . Immune Globulin 10%      Verneita Griffes, MD [facility] 2:37 PM

## 2018-08-13 NOTE — Progress Notes (Signed)
Discussed IVIG infusion rate with Erasmo Downer, Oncology NP: Increase rate to 58mL/hr if tolerated by patient.  BP low at this time, will re-evaluate w next VS check.

## 2018-08-13 NOTE — Progress Notes (Signed)
Physical Therapy Treatment Patient Details Name: Zachary Bennett MRN: [id number] DOB: 1935/03/14 Today's Date: 08/13/2018    History of Present Illness Pt is an 84 y.o. male admitted with generalized weakness SOB, found to have CAP and AKI, abdominal pain secondary to splenomegaly. Pt currently awaiting PET scan for prior concern of lymphoma.     PT Comments    Pt making steady progress with functional mobility. He tolerated ambulating increased distance this session. Pt would continue to benefit from skilled physical therapy services at this time while admitted and after d/c to address the below listed limitations in order to improve overall safety and independence with functional mobility.    Follow Up Recommendations  Home health PT;Supervision/Assistance - 24 hour     Equipment Recommendations  Rolling walker with 5" wheels;3in1 (PT)    Recommendations for Other Services       Precautions / Restrictions Precautions Precautions: Fall Restrictions Weight Bearing Restrictions: No    Mobility  Bed Mobility Overal bed mobility: Needs Assistance Bed Mobility: Sit to Supine       Sit to supine: Min assist   General bed mobility comments: assistance needed to return bilateral LEs onto bed  Transfers Overall transfer level: Needs assistance Equipment used: Rolling walker (2 wheeled) Transfers: Sit to/from Stand Sit to Stand: Min assist         General transfer comment: min A to power into standing from recliner chair; good technique  Ambulation/Gait Ambulation/Gait assistance: Min guard Gait Distance (Feet): 30 Feet Assistive device: Rolling walker (2 wheeled) Gait Pattern/deviations: Step-through pattern;Decreased step length - left;Decreased step length - right;Decreased stride length;Shuffle;Trunk flexed Gait velocity: decreased   General Gait Details: pt generally steady with RW, min guard for safety, cueing to maintain proximity to [employer]    Modified Rankin (Stroke Patients Only)       Balance Overall balance assessment: Needs assistance Sitting-balance support: No upper extremity supported Sitting balance-Leahy Scale: Fair     Standing balance support: Bilateral upper extremity supported Standing balance-Leahy Scale: Poor                              Cognition Arousal/Alertness: Awake/alert Behavior During Therapy: WFL for tasks assessed/performed Overall Cognitive Status: Within Functional Limits for tasks assessed                                        Exercises      General Comments        Pertinent Vitals/Pain Pain Assessment: Faces Faces Pain Scale: Hurts little more Pain Location: back Pain Descriptors / Indicators: Sore Pain Intervention(s): Monitored during session;Repositioned    Home Living                      Prior Function            PT Goals (current goals can now be found in the care plan section) Acute Rehab PT Goals PT Goal Formulation: With patient Potential to Achieve Goals: Fair Progress towards PT goals: Progressing toward goals    Frequency    Min 3X/week      PT Plan Current plan remains appropriate    Co-evaluation  AM-PAC PT "6 Clicks" Mobility   Outcome Measure  Help needed turning from your back to your side while in a flat bed without using bedrails?: None Help needed moving from lying on your back to sitting on the side of a flat bed without using bedrails?: None Help needed moving to and from a bed to a chair (including a wheelchair)?: A Little Help needed standing up from a chair using your arms (e.g., wheelchair or bedside chair)?: A Little Help needed to walk in hospital room?: A Little Help needed climbing 3-5 steps with a railing? : A Lot 6 Click Score: 19    End of Session   Activity Tolerance: Patient tolerated treatment well Patient left: in bed;with call  bell/phone within reach;Other (comment)(RN present) Nurse Communication: Mobility status PT Visit Diagnosis: Unsteadiness on feet (R26.81)     Time: 8177-1165 PT Time Calculation (min) (ACUTE ONLY): 15 min  Charges:  $Gait Training: 8-22 mins                     Sherie Don, [place], DPT  Acute Rehabilitation Services Pager [phone] Office [place] 08/13/2018, 5:02 PM

## 2018-08-13 NOTE — Progress Notes (Signed)
IVIG administration: Discussed with Ulice Dash, RN: She will administer benadryl pre-med for IVIG. VAST RN will hang med around 1120.

## 2018-08-13 NOTE — Progress Notes (Signed)
HEMATOLOGY-ONCOLOGY PROGRESS NOTE  SUBJECTIVE: IVIG infusing.  He is tolerating this fairly well.  Received 1 unit of packed red blood cells and 1 unit of platelets since yesterday. Has ongoing fatigue.  Denies bleeding. Has multiple bruises to his arms. Abdominal pain resolved. Has been ambulating with PT. No other complaints this morning.   REVIEW OF SYSTEMS:   Constitutional: Denies fevers, chills.  Reports progressive fatigue and weakness.   Eyes: Denies blurriness of vision Ears, nose, mouth, throat, and face: Denies mucositis or sore throat Respiratory: Reports productive cough and shortness of breath. cardiovascular: Denies palpitation, chest discomfort Gastrointestinal: Denies abdominal pain, nausea, vomiting.  Skin: Denies abnormal skin rashes Lymphatics: Denies new lymphadenopathy.  Bruises easily. Neurological:Denies numbness, tingling or new weaknesses Behavioral/Psych: Mood is stable, no new changes  Extremities: Reports lower extremity edema. All other systems were reviewed with the patient and are negative.  I have reviewed the past medical history, past surgical history, social history and family history with the patient and they are unchanged from previous note.   PHYSICAL EXAMINATION: ECOG PERFORMANCE STATUS: 2 - Symptomatic, <50% confined to bed  Vitals:   08/13/18 1159 08/13/18 1309  BP: (!) 106/51 (!) 107/54  Pulse: 88 84  Resp: 20   Temp: 98 F (36.7 C) 98.2 F (36.8 C)  SpO2: 100%    Filed Weights   08/10/18 2158 08/11/18 2106 08/12/18 1440  Weight: 185 lb 1.6 oz (84 kg) 177 lb 14.4 oz (80.7 kg) 183 lb 11.2 oz (83.3 kg)    GENERAL:alert, no distress and comfortable SKIN: skin color, texture, turgor are normal, no rashes or significant lesions. Multiple bruises noted over bilateral arms.  EYES: normal, Conjunctiva are pink and non-injected, sclera clear OROPHARYNX:no exudate, no erythema and lips, buccal mucosa, and tongue normal  NECK: supple, thyroid  normal size, non-tender, without nodularity LYMPH:  no palpable lymphadenopathy in the cervical, axillary or inguinal LUNGS: Diminished breath sounds bilaterally HEART: regular rate & rhythm and no murmurs.  1+ bilateral lower extremity edema. Arms are edematous.  ABDOMEN: Normal bowel sounds, abdomen soft, diffuse tenderness with light palpation.  Splenomegaly noted. Musculoskeletal:no cyanosis of digits and no clubbing  NEURO: alert & oriented x 3 with fluent speech, no focal motor/sensory deficits  LABORATORY DATA:  I have reviewed the data as listed CMP Latest Ref Rng & Units 08/12/2018 08/11/2018 08/10/2018  Glucose 70 - 99 mg/dL 118(H) 130(H) 137(H)  BUN 8 - 23 mg/dL 39(H) 43(H) 48(H)  Creatinine 0.61 - 1.24 mg/dL 1.12 1.04 1.23  Sodium 135 - 145 mmol/L 140 141 139  Potassium 3.5 - 5.1 mmol/L 3.9 4.6 4.6  Chloride 98 - 111 mmol/L 111 110 112(H)  CO2 22 - 32 mmol/L 22 23 21(L)  Calcium 8.9 - 10.3 mg/dL 7.5(L) 7.4(L) 7.2(L)  Total Protein 6.5 - 8.1 g/dL 5.2(L) 4.0(L) 3.8(L)  Total Bilirubin 0.3 - 1.2 mg/dL 1.4(H) 1.5(H) 1.5(H)  Alkaline Phos 38 - 126 U/L 141(H) 160(H) 163(H)  AST 15 - 41 U/L 32 34 38  ALT 0 - 44 U/L 23 23 24     Lab Results  Component Value Date   WBC 0.3 (LL) 08/12/2018   HGB 7.0 (L) 08/12/2018   HCT 22.4 (L) 08/12/2018   MCV 85.2 08/12/2018   PLT 9 (LL) 08/12/2018   NEUTROABS 0.2 (L) 08/12/2018    ASSESSMENT AND PLAN: This is an 84 year old male with   1.  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  Splenomegaly 3.  Enlarged  mediastinal lymph nodes 4.  Community-acquired pneumonia 5.  Acute renal failure 6.  Hypoalbuminemia 7.  Hyperuricemia  -Bone marrow biopsy shows small abnormal B-cell population identified.  Received 1 dose of Rituxan on 08/07/2018.  Tolerated the infusion well.  Case was reviewed in the hematology conference and recommendation was for weekly Rituxan.  We will plan to repeat a dose on 08/14/2018. -Continue  dexamethasone 4 mg daily for severe thrombocytopenia which could help both lymphoma and thrombocytopenia.  Received IVIG 1 g/kg on 08/11/2018 and again on 08/12/2018. He will be premedicated with Tylenol and Benadryl. The patient is agreeable to proceeding with this medication. -He has no active bleeding.  We will hold off on a platelet transfusion today. Recommend platelet transfusion for platelet count less than 10,000 or active bleeding.  Recheck CBC in the morning. -Continue to monitor white blood cell count and hemoglobin. 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  Could be causing renal dysfunction.  Status post 1 dose of rasburicase 6 mg on 08/04/2018 with significant drop in uric acid level.  Continue allopurinol. -Renal function remains stable. -Goals of care: Prolongation of life and improvement of symptoms.  The patient does not wish to be intubated or resuscitated if his heart were to stop.  Mikey Bussing, DNP, AGPCNP-BC, AOCNP

## 2018-08-14 LAB — COMPREHENSIVE METABOLIC PANEL
ALT: 20 U/L (ref 0–44)
AST: 30 U/L (ref 15–41)
Albumin: 1.7 g/dL — ABNORMAL LOW (ref 3.5–5.0)
Alkaline Phosphatase: 146 U/L — ABNORMAL HIGH (ref 38–126)
Anion gap: 7 (ref 5–15)
BUN: 32 mg/dL — ABNORMAL HIGH (ref 8–23)
CO2: 23 mmol/L (ref 22–32)
Calcium: 7.6 mg/dL — ABNORMAL LOW (ref 8.9–10.3)
Chloride: 111 mmol/L (ref 98–111)
Creatinine, Ser: 1.05 mg/dL (ref 0.61–1.24)
GFR calc Af Amer: >60 mL/min (ref >60)
GFR calc non Af Amer: >60 mL/min (ref >60)
Glucose, Bld: 98 mg/dL (ref 70–99)
Potassium: 4.1 mmol/L (ref 3.5–5.1)
Sodium: 141 mmol/L (ref 135–145)
Total Bilirubin: 1.6 mg/dL — ABNORMAL HIGH (ref 0.3–1.2)
Total Protein: 5.5 g/dL — ABNORMAL LOW (ref 6.5–8.1)

## 2018-08-14 LAB — CBC WITH DIFFERENTIAL/PLATELET
Abs Immature Granulocytes: 0.01 10*3/uL (ref 0.00–0.07)
Basophils Absolute: 0 10*3/uL (ref 0.0–0.1)
Basophils Relative: 0 %
Eosinophils Absolute: 0 10*3/uL (ref 0.0–0.5)
Eosinophils Relative: 0 %
HCT: 21.5 % — ABNORMAL LOW (ref 39.0–52.0)
Hemoglobin: 7 g/dL — ABNORMAL LOW (ref 13.0–17.0)
Immature Granulocytes: 3 %
Lymphocytes Relative: 32 %
Lymphs Abs: 0.1 10*3/uL — ABNORMAL LOW (ref 0.7–4.0)
MCH: 27.6 pg (ref 26.0–34.0)
MCHC: 32.6 g/dL (ref 30.0–36.0)
MCV: 84.6 fL (ref 80.0–100.0)
Monocytes Absolute: 0 10*3/uL — ABNORMAL LOW (ref 0.1–1.0)
Monocytes Relative: 6 %
Neutro Abs: 0.2 10*3/uL — ABNORMAL LOW (ref 1.7–7.7)
Neutrophils Relative %: 59 %
Platelets: 13 10*3/uL — CL (ref 150–400)
RBC: 2.54 MIL/uL — ABNORMAL LOW (ref 4.22–5.81)
RDW: 17.8 % — ABNORMAL HIGH (ref 11.5–15.5)
WBC: 0.3 10*3/uL — CL (ref 4.0–10.5)
nRBC: 0 % (ref 0.0–0.2)

## 2018-08-14 LAB — BPAM PLATELET PHERESIS
Blood Product Expiration Date: 202005211743
ISSUE DATE / TIME: 202005210045
Unit Type and Rh: 5100

## 2018-08-14 LAB — PREPARE PLATELET PHERESIS: Unit division: 0

## 2018-08-14 LAB — PREPARE RBC (CROSSMATCH)

## 2018-08-14 MED ORDER — DIPHENHYDRAMINE HCL 25 MG PO CAPS
25.0000 mg | ORAL_CAPSULE | Freq: Once | ORAL | Status: AC
  Administered 2018-08-14: 25 mg via ORAL
  Filled 2018-08-14: qty 1

## 2018-08-14 MED ORDER — TBO-FILGRASTIM 480 MCG/0.8ML ~~LOC~~ SOSY
480.0000 ug | PREFILLED_SYRINGE | Freq: Every day | SUBCUTANEOUS | Status: DC
  Administered 2018-08-14 – 2018-08-17 (×4): 480 ug via SUBCUTANEOUS
  Filled 2018-08-14 (×5): qty 0.8

## 2018-08-14 MED ORDER — ACETAMINOPHEN 325 MG PO TABS
650.0000 mg | ORAL_TABLET | Freq: Once | ORAL | Status: AC
  Administered 2018-08-14: 650 mg via ORAL
  Filled 2018-08-14: qty 2

## 2018-08-14 MED ORDER — SODIUM CHLORIDE 0.9% IV SOLUTION: Freq: Once | INTRAVENOUS | Status: DC

## 2018-08-14 NOTE — Progress Notes (Signed)
OT Cancellation Note  Patient Details Name: Zachary Bennett MRN: [id number] DOB: October 13, 1934   Cancelled Treatment:    Reason Eval/Treat Not Completed: Fatigue/lethargy limiting ability to participate.  Attempted skilled OT tx. Session. Pt. Declines stating he did not sleep well last night and also had some issues with his catheter so he is not wanting to move right now and wants to rest.  Will attempt back as able.   Janice Coffin, COTA/L 08/14/2018, 1:24 PM

## 2018-08-14 NOTE — Progress Notes (Addendum)
PROGRESS NOTE  Zachary Bennett TLX:[id number] DOB: [birth year] DOA: 08/03/2018 PCP: Nicholas Lose, MD   LOS: 10 days   Brief narrative:  84 y.o. White ?  thrombocytopenia and hyperlipidemia; who presented with complaints of abdominal pain and worsening shortness of breath- Admitted to the hospital from 4/29-5/2 after being found to have splenomegaly with pancytopenia with imaging concerning for lymphoma.   Dr. Lindi Adie and ultimately the plan had been for an outpatient PET scan for targeted biopsy of a hypermetabolic site.   PET scan was not scheduled until the 19th of this month.  Re-presented with anorexia, fever, chills to emergency room on 5/11  afebrile, pulse 92-100, respirations 18-24, blood pressure 99/58-108/62, and O2 saturation maintained on room air. Patient was placed on 2 L nasal cannula oxygen for comfort.   Labs revealed WBC 1.9, platelets 18, sodium 133, CO2 16, BUN 73, creatinine 2.32, calcium 7.8, alkaline phosphatase 233, albumin 2.5, lipase 68, AST 66 , and total bilirubin 5.9.    Chest x-ray showed right lower lobe infiltrate concerning for possible pneumonia.  Patient was given 1 L normal saline IV fluids, vancomycin, and cefepime.  Patient was then considered for admission to the hospital.  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 Lindi Adie following, appreciate assistance.  No active bleeding at this time.   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 Lindi Adie 5/21-giving 1 packet of platelets in addition to 1 unit of blood 5/21 through 5/22 --further care as per Dr. Lindi Adie and oncology team--appreciate input  Hyperuricemia Uric acid level 10.9 on admission. --s/p rasburicase 6 mg IV on 08/04/2018 --Continue allopurinol  Abdominal pain, secondary to splenomegaly:  Patient noted to have previous CT scan showing severe splenomegaly pretracheal lymph nodes and subcarinal lymph enlargement from 4/29. Oncology on board and had recommended PET scan which is scheduled for 5/19, to evaluate most hypermetabolic area for biopsy.   --Oncology d/w with general surgery regarding possible need for splenectomy, defer for now --continue oxycodone/fentanyl as needed for abdominal pain.   Acute kidney injury: resolved Etiology likely secondary to volume depletion with poor oral intake prior to hospitalization. likely secondary to volume depletion from poor oral intake.  Also consideration of hyperuricemia. Creatinine on admission 2.32. --continue with IV fluid hydration.   --Creatinine improving 2.32-->1.68-->1.39-->1.18-->1.35-->1.23-->1.04-->1.12->1.05 --Avoid nephrotoxins, renally dose all medications --daily BMP  Dyspnea likely secondary to community-acquired pneumonia.   On Supplemental oxygen.  Will wean oxygen as able.  Chest x-ray showing signs of a ill-defined airspace opacity in the right medial lung base concerning for possible pneumonia.    Patient does have a large mediastinal lymphadenopathy as well.  Afebrile admission with a white blood cell count of 1.5. --COVID-19 negative --Urine Legionella antigen negative --MRSA PCR negative, discontinue vancomycin today  --s/p 7-day course of cefepime --Blood cultures no growth x 5 days --Titrate supplemental oxygen to maintain SPO2 greater than 92%--desat screen when able --Incentive spirometry --Continue  supportive care.  Elevated bilirubin/elevated LFTs:  Bilirubin level has slightly improved.. Patient previously noted to have cholelithiasis without concerning symptoms for acute cholecystitis during last hospitalization. Hold further imaging at this time  Bilateral lower extremity edema, right upper extremity edema Ultrasound right upper extremity negative for DVT. -I have had to discontinue his IV Lasix because of hypotension on 5/20--likely related to IVIG infusions --daily weights, strict I/O's and is so far -5 liters for admit --Encourage mobilization  Prolonged QT interval:  QTc503 on admission.  --avoid QT prolonging medications -Currently QTc0.40   Hyperlipidemia. Continue to hold statin.  Tobacco abuse:  50-pack-year history.  Quit smoking 2 weeks back.  Emphasized to abstain from smoking.   VTE Prophylaxis: SCDs Code Status: Full Family Communication: discussed with son essentials of the case--am hopeful that patient can have disease controlled.  I have asked that Dr. Lindi Adie reach out to further clarify with patient family--patient is very realistic about GOC--he is a [employer] and served for 3 years way back in the past Disposition Plan: continue inpatient hospitalization--Defer discussions to Oncology based on further clinical course--patient is not stable nor ready for d/c at this timer given severity of ITP  Consultants:  Oncology  Interventional radiology  Procedures:  IR bone marrow biopsy 08/05/2018   IVIG infusion planned for 5/19 and 5/20  Antibiotics: Anti-infectives (From admission, onward)   Start     Dose/Rate Route Frequency Ordered Stop   08/05/18 1600  vancomycin (VANCOCIN) 1,250 mg in sodium chloride 0.9 % 250 mL IVPB  Status:  Discontinued     1,250 mg 166.7 mL/hr over 90 Minutes Intravenous Every 48 hours 08/03/18 1449 08/05/18 1248   08/05/18 1000  ceFEPIme (MAXIPIME) 2 g in sodium chloride 0.9 % 100 mL IVPB  Status:  Discontinued      2 g 200 mL/hr over 30 Minutes Intravenous Every 12 hours 08/05/18 0852 08/08/18 1010   08/04/18 1400  ceFEPIme (MAXIPIME) 2 g in sodium chloride 0.9 % 100 mL IVPB  Status:  Discontinued     2 g 200 mL/hr over 30 Minutes Intravenous Every 24 hours 08/03/18 1541 08/05/18 0852   08/03/18 1430  ceFEPIme (MAXIPIME) 2 g in sodium chloride 0.9 % 100 mL IVPB     2 g 200 mL/hr over 30 Minutes Intravenous  Once 08/03/18 1420 08/03/18 1512   08/03/18 1430  vancomycin (VANCOCIN) 1,500 mg in sodium chloride 0.9 % 500 mL IVPB     1,500 mg 250 mL/hr over 120 Minutes Intravenous  Once 08/03/18 1421 08/03/18 1744      Objective: Vitals:   08/14/18 0812 08/14/18 0910  BP: (!) 98/51 (!) 97/55  Pulse: 80 69  Resp: 18 18  Temp: 97.6 F (36.4 C) 97.6 F (36.4 C)  SpO2: 96% 98%    Intake/Output Summary (Last 24 hours) at 08/14/2018 1125 Last data filed at 08/14/2018 0900 Gross per 24 hour  Intake 1000 ml  Output 400 ml  Net 600 ml   Filed Weights   08/11/18 2106 08/12/18 1440 08/14/18 0012  Weight: 80.7 kg 83.3 kg 82.8 kg   Body mass index is 27.75 kg/m.   Physical Exam:  Awake alert pleasant no flushing S1-S2 no murmur rub or gallop Abdomen soft + spelnomegally Chest clinically clear Grade 2 pitting lower extremity edema Neurologically able to move all 4 extremities without deficit Now off oxygen   Data Review: I have personally reviewed the following laboratory data and studies,  CBC: Recent Labs  Lab 08/09/18 0333  08/10/18 0346 08/11/18 0350 08/12/18 1323 08/13/18 1538 08/14/18 0337  WBC 0.4* 0.4* 0.4* 0.3*  --  0.3*  NEUTROABS 0.4* 0.3* 0.2* 0.2*  --  0.2*  HGB 8.7* 7.9* 7.9* 7.0* 7.6* 7.0*  HCT 27.3* 25.0* 24.6* 22.4* 23.8* 21.5*  MCV 83.7 84.2 83.7 85.2  --  84.6  PLT 8* 11* 6* 9*  --  13*   Basic Metabolic Panel: Recent Labs  Lab 08/08/18 0355 08/09/18 0333 08/10/18 0346 08/11/18 0350 08/12/18 1323 08/14/18 0337  NA 138 138 139 141 140 141  K 4.9 4.9  4.6 4.6 3.9 4.1  CL 112* 114* 112* 110 111 111  CO2 17* 17* 21* 23 22 23   GLUCOSE 103* 144* 137* 130* 118* 98  BUN 45* 49* 48* 43* 39* 32*  CREATININE 1.35* 1.35* 1.23 1.04 1.12 1.05  CALCIUM 6.7* 7.0* 7.2* 7.4* 7.5* 7.6*  MG  --   --  2.6*  --   --   --   PHOS 3.9  --   --   --   --   --    Liver Function Tests: Recent Labs  Lab 08/09/18 0333 08/10/18 0346 08/11/18 0350 08/12/18 1323 08/14/18 0337  AST 47* 38 34 32 30  ALT 21 24 23 23 20   ALKPHOS 164* 163* 160* 141* 146*  BILITOT 2.1* 1.5* 1.5* 1.4* 1.6*  PROT 3.8* 3.8* 4.0* 5.2* 5.5*  ALBUMIN 1.6* 1.7* 1.8* 1.8* 1.7*   No results for input(s): LIPASE, AMYLASE in the last 168 hours. No results for input(s): AMMONIA in the last 168 hours. Cardiac Enzymes: No results for input(s): CKTOTAL, CKMB, CKMBINDEX, TROPONINI in the last 168 hours. BNP (last 3 results) Recent Labs    08/03/18 1303  BNP 73.0    ProBNP (last 3 results) No results for input(s): PROBNP in the last 8760 hours.  CBG: No results for input(s): GLUCAP in the last 168 hours.    Studies: No results found.  Scheduled Meds: . sodium chloride   Intravenous Once  . acetaminophen  650 mg Oral Once  . allopurinol  100 mg Oral BID  . dexamethasone  4 mg Intravenous Q24H  . diphenhydrAMINE  25 mg Oral Once  . docusate sodium  100 mg Oral BID  . guaiFENesin  600 mg Oral BID  . riTUXimab (RITUXAN) IV infusion  375 mg/m2 Intravenous Once  . zolpidem  5 mg Oral QHS    Continuous Infusions:   Verneita Griffes, MD [facility] 11:25 AM

## 2018-08-14 NOTE — Progress Notes (Addendum)
Physical Therapy Treatment Patient Details Name: Zachary Bennett MRN: [id number] DOB: 09-Jul-1934 Today's Date: 08/14/2018    History of Present Illness 84 y.o. male admitted with generalized weakness SOB, found to have CAP and AKI, abdominal pain secondary to splenomegaly. Pt currently awaiting PET scan for prior concern of lymphoma.     PT Comments    Pt seen to reinforce therex in light of weekend and decreased PT frequency. Pt able to perform and demonstrate good technique and cary over.  Declined OOB mobiltiy as he had an active afternoon.   Follow Up Recommendations  Home health PT;Supervision/Assistance - 24 hour(and Sons physical support)     Equipment Recommendations  Rolling walker with 5" wheels;3in1 (PT)    Recommendations for Other Services       Precautions / Restrictions Precautions Precautions: Fall Restrictions Weight Bearing Restrictions: No    Mobility  Bed Mobility Overal bed mobility: Needs Assistance Bed Mobility: Supine to Sit;Sit to Supine     Supine to sit: Min assist Sit to supine: Min assist   General bed mobility comments: Pt performed supine to sit and sit to supine he required assistance with trunk elevation to edge of bed and lifting B LEs to return to bed.    Transfers Overall transfer level: Needs assistance Equipment used: Rolling walker (2 wheeled) Transfers: Sit to/from Stand Sit to Stand: Min assist         General transfer comment: Cues for hand placement to and from seated surface.  Pt mildly unsteady in standing.    Ambulation/Gait Ambulation/Gait assistance: Min assist Gait Distance (Feet): 6 Feet(x2 to and from commode chair.  ) Assistive device: Rolling walker (2 wheeled) Gait Pattern/deviations: Step-through pattern;Decreased step length - left;Decreased step length - right;Decreased stride length;Shuffle;Trunk flexed Gait velocity: decreased   General Gait Details: Min assistance to move with RW with assistance to  turn device when backing.     Stairs             Wheelchair Mobility    Modified Rankin (Stroke Patients Only)       Balance Overall balance assessment: Needs assistance Sitting-balance support: No upper extremity supported Sitting balance-Leahy Scale: Fair       Standing balance-Leahy Scale: Poor                              Cognition Arousal/Alertness: Awake/alert Behavior During Therapy: WFL for tasks assessed/performed Overall Cognitive Status: Within Functional Limits for tasks assessed                                        Exercises General Exercises - Lower Extremity Ankle Circles/Pumps: 10 reps;Both Quad Sets: 20 reps Heel Slides: 20 reps Hip ABduction/ADduction: 20 reps    General Comments        Pertinent Vitals/Pain Pain Assessment: Faces Faces Pain Scale: Hurts little more Pain Location: back Pain Descriptors / Indicators: Sore Pain Intervention(s): Monitored during session;Repositioned    Home Living                      Prior Function            PT Goals (current goals can now be found in the care plan section) Acute Rehab PT Goals Patient Stated Goal: go home with family PT Goal Formulation: With patient Potential to Achieve  Goals: Fair Progress towards PT goals: Progressing toward goals    Frequency    Min 3X/week      PT Plan Current plan remains appropriate    Co-evaluation              AM-PAC PT "6 Clicks" Mobility   Outcome Measure  Help needed turning from your back to your side while in a flat bed without using bedrails?: None Help needed moving from lying on your back to sitting on the side of a flat bed without using bedrails?: None Help needed moving to and from a bed to a chair (including a wheelchair)?: A Little Help needed standing up from a chair using your arms (e.g., wheelchair or bedside chair)?: A Little Help needed to walk in hospital room?: A Little Help  needed climbing 3-5 steps with a railing? : A Lot 6 Click Score: 19    End of Session Equipment Utilized During Treatment: Gait belt Activity Tolerance: Patient tolerated treatment well Patient left: in bed Nurse Communication: Mobility status PT Visit Diagnosis: Unsteadiness on feet (R26.81)     Time: 1735-1750 PT Time Calculation (min) (ACUTE ONLY): 15 min  Charges:  $Therapeutic Exercise: 8-22 mins                     Reinaldo Berber, PT, DPT Acute Rehabilitation Services Pager: [phone] Office: [phone]     Reinaldo Berber 08/14/2018, 5:47 PM

## 2018-08-14 NOTE — Progress Notes (Addendum)
HEMATOLOGY-ONCOLOGY PROGRESS NOTE  SUBJECTIVE: Thinks bruising may be improving. No overt bleeding. Still has fatigue and edema. Mild LUQ abdominal pain. No other complaints.    REVIEW OF SYSTEMS:   Constitutional: Denies fevers, chills.  Reports fatigue and weakness.   Eyes: Denies blurriness of vision Ears, nose, mouth, throat, and face: Denies mucositis or sore throat Respiratory: Reports productive cough and shortness of breath. cardiovascular: Denies palpitation, chest discomfort Gastrointestinal: Denies nausea, vomiting. Has mild, intermittent LUQ pain.  Skin: Denies abnormal skin rashes  Lymphatics: Denies new lymphadenopathy.  Bruises easily. Neurological:Denies numbness, tingling or new weaknesses Behavioral/Psych: Mood is stable, no new changes  Extremities: Reports lower extremity edema. All other systems were reviewed with the patient and are negative.  I have reviewed the past medical history, past surgical history, social history and family history with the patient and they are unchanged from previous note.   PHYSICAL EXAMINATION: ECOG PERFORMANCE STATUS: 2 - Symptomatic, <50% confined to bed  Vitals:   08/14/18 0812 08/14/18 0910  BP: (!) 98/51 (!) 97/55  Pulse: 80 69  Resp: 18 18  Temp: 97.6 F (36.4 C) 97.6 F (36.4 C)  SpO2: 96% 98%   Filed Weights   08/11/18 2106 08/12/18 1440 08/14/18 0012  Weight: 177 lb 14.4 oz (80.7 kg) 183 lb 11.2 oz (83.3 kg) 182 lb 8 oz (82.8 kg)    GENERAL:alert, no distress and comfortable SKIN: skin color, texture, turgor are normal, no rashes or significant lesions. Multiple bruises noted over bilateral arms.  EYES: normal, Conjunctiva are pink and non-injected, sclera clear OROPHARYNX:no exudate, no erythema and lips, buccal mucosa, and tongue normal  NECK: supple, thyroid normal size, non-tender, without nodularity LYMPH:  no palpable lymphadenopathy in the cervical, axillary or inguinal LUNGS: Diminished breath sounds  bilaterally HEART: regular rate & rhythm and no murmurs.  1+ bilateral lower extremity edema. Arms are edematous.  ABDOMEN: Normal bowel sounds, abdomen soft, diffuse tenderness with light palpation.  Splenomegaly noted. Musculoskeletal:no cyanosis of digits and no clubbing  NEURO: alert & oriented x 3 with fluent speech, no focal motor/sensory deficits  LABORATORY DATA:  I have reviewed the data as listed CMP Latest Ref Rng & Units 08/14/2018 08/12/2018 08/11/2018  Glucose 70 - 99 mg/dL 98 118(H) 130(H)  BUN 8 - 23 mg/dL 32(H) 39(H) 43(H)  Creatinine 0.61 - 1.24 mg/dL 1.05 1.12 1.04  Sodium 135 - 145 mmol/L 141 140 141  Potassium 3.5 - 5.1 mmol/L 4.1 3.9 4.6  Chloride 98 - 111 mmol/L 111 111 110  CO2 22 - 32 mmol/L _0 Calcium 8.9 - 10.3 mg/dL 7.6(L) 7.5(L) 7.4(L)  Total Protein 6.5 - 8.1 g/dL 5.5(L) 5.2(L) 4.0(L)  Total Bilirubin 0.3 - 1.2 mg/dL 1.6(H) 1.4(H) 1.5(H)  Alkaline Phos 38 - 126 U/L 146(H) 141(H) 160(H)  AST 15 - 41 U/L 30 32 34  ALT 0 - 44 U/L _1 Lab Results  Component Value Date   WBC 0.3 (LL) 08/14/2018   HGB 7.0 (L) 08/14/2018   HCT 21.5 (L) 08/14/2018   MCV 84.6 08/14/2018   PLT 13 (LL) 08/14/2018   NEUTROABS 0.2 (L) 08/14/2018    ASSESSMENT AND PLAN: This is an 84 year old male with   1.  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  Splenomegaly 3.  Enlarged mediastinal lymph nodes 4.  Community-acquired pneumonia 5.  Acute renal failure 6.  Hypoalbuminemia 7.  Hyperuricemia  -Bone marrow biopsy shows small abnormal B-cell  population identified.  Received 1 dose of Rituxan on 08/07/2018.  Tolerated the infusion well.  Case was reviewed in the hematology conference and recommendation was for weekly Rituxan.  Due for 2nd dose today. -Continue dexamethasone 4 mg daily for severe thrombocytopenia which could help both lymphoma and thrombocytopenia.  Received IVIG 1 g/kg on 08/11/2018 and again on 08/12/2018.  -He has no  active bleeding.  We will hold off on a platelet transfusion today. Recommend platelet transfusion for platelet count less than 10,000 or active bleeding.  Recheck CBC in the morning. -Hemoglobin is 7.0. Will give 1 unit PRBC today after Rituxan. -Add Granix 480 mcg daily for neutropenia.  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  Could be causing renal dysfunction.  Status post 1 dose of rasburicase 6 mg on 08/04/2018 with significant drop in uric acid level.  Continue allopurinol. -Renal function remains stable. -Goals of care: Prolongation of life and improvement of symptoms.  The patient does not wish to be intubated or resuscitated if his heart were to stop. -Disposition: We are hopeful that we may see a response in his counts to Dexamethasone, Rituxan, IVIG, and Granix. Will re-evaluate early next week. If he does not start to respond, then will need to have further discussions regarding goals of care.   Mikey Bussing, DNP, AGPCNP-BC, AOCNP  Attending Note  I personally saw the patient, reviewed the chart and examined the patient. The plan of care was discussed with the patient   1.  Pancytopenia: IV Rituxan today Completed IVIG Granix to be started for his neutropenia 1 unit of PRBC being given today as well Platelets were given yesterday.  No active bleeding 2.  B-cell lymphoma: Presumptive diagnosis based upon a scant population of lymphoid cells on the bone marrow biopsy.  Enlarged spleen.  Based on tumor board recommendation we are continuing with Rituxan second dose today. 3.  Patient understands that if his counts do not recover by next Tuesday we may have to discuss hospice care as a possibility.  Please do not hesitate to call the hematologist on call for any questions or concerns. I will see the patient next Tuesday.

## 2018-08-14 NOTE — Progress Notes (Signed)
Physical Therapy Treatment Patient Details Name: Zachary Bennett MRN: [id number] DOB: 1934-05-06 Today's Date: 08/14/2018    History of Present Illness Pt is an 84 y.o. male admitted with generalized weakness SOB, found to have CAP and AKI, abdominal pain secondary to splenomegaly. Pt currently awaiting PET scan for prior concern of lymphoma.     PT Comments    Pt performed gait training to and from commode.  He refused further gait training due to fatigue.  Pt did have a successful BM.  He continues to benefit from PT in the acute setting.  Plan next session for continued OOB activities and progression of gait training.      Follow Up Recommendations  Home health PT;Supervision/Assistance - 24 hour     Equipment Recommendations  Rolling walker with 5" wheels;3in1 (PT)    Recommendations for Other Services       Precautions / Restrictions Precautions Precautions: Fall Restrictions Weight Bearing Restrictions: No    Mobility  Bed Mobility Overal bed mobility: Needs Assistance Bed Mobility: Supine to Sit;Sit to Supine     Supine to sit: Min assist Sit to supine: Min assist   General bed mobility comments: Pt performed supine to sit and sit to supine he required assistance with trunk elevation to edge of bed and lifting B LEs to return to bed.    Transfers Overall transfer level: Needs assistance Equipment used: Rolling walker (2 wheeled) Transfers: Sit to/from Stand Sit to Stand: Min assist         General transfer comment: Cues for hand placement to and from seated surface.  Pt mildly unsteady in standing.    Ambulation/Gait Ambulation/Gait assistance: Min assist Gait Distance (Feet): 6 Feet(x2 to and from commode chair.  ) Assistive device: Rolling walker (2 wheeled) Gait Pattern/deviations: Step-through pattern;Decreased step length - left;Decreased step length - right;Decreased stride length;Shuffle;Trunk flexed Gait velocity: decreased   General Gait  Details: Min assistance to move with RW with assistance to turn device when backing.     Stairs             Wheelchair Mobility    Modified Rankin (Stroke Patients Only)       Balance Overall balance assessment: Needs assistance Sitting-balance support: No upper extremity supported Sitting balance-Leahy Scale: Fair       Standing balance-Leahy Scale: Poor                              Cognition Arousal/Alertness: Awake/alert Behavior During Therapy: WFL for tasks assessed/performed Overall Cognitive Status: Within Functional Limits for tasks assessed                                        Exercises      General Comments        Pertinent Vitals/Pain Pain Assessment: Faces Faces Pain Scale: Hurts little more Pain Location: back Pain Descriptors / Indicators: Sore Pain Intervention(s): Monitored during session;Repositioned    Home Living                      Prior Function            PT Goals (current goals can now be found in the care plan section) Acute Rehab PT Goals Patient Stated Goal: go home with family Potential to Achieve Goals: Fair Progress towards PT goals: Progressing  toward goals    Frequency    Min 3X/week      PT Plan Current plan remains appropriate    Co-evaluation              AM-PAC PT "6 Clicks" Mobility   Outcome Measure  Help needed turning from your back to your side while in a flat bed without using bedrails?: None Help needed moving from lying on your back to sitting on the side of a flat bed without using bedrails?: None Help needed moving to and from a bed to a chair (including a wheelchair)?: A Little Help needed standing up from a chair using your arms (e.g., wheelchair or bedside chair)?: A Little Help needed to walk in hospital room?: A Little Help needed climbing 3-5 steps with a railing? : A Lot 6 Click Score: 19    End of Session Equipment Utilized During  Treatment: Gait belt Activity Tolerance: Patient tolerated treatment well Patient left: in bed;with call bell/phone within reach;Other (comment)(RN present) Nurse Communication: Mobility status PT Visit Diagnosis: Unsteadiness on feet (R26.81)     Time: 2993-7169 PT Time Calculation (min) (ACUTE ONLY): 26 min  Charges:  $Gait Training: 8-22 mins $Therapeutic Activity: 8-22 mins                     Governor Rooks, PTA Acute Rehabilitation Services Pager [phone] Office [phone]     Junaid Wurzer Eli Hose 08/14/2018, 4:53 PM

## 2018-08-15 LAB — CBC WITH DIFFERENTIAL/PLATELET
Abs Immature Granulocytes: 0.05 10*3/uL (ref 0.00–0.07)
Basophils Absolute: 0 10*3/uL (ref 0.0–0.1)
Basophils Relative: 0 %
Eosinophils Absolute: 0 10*3/uL (ref 0.0–0.5)
Eosinophils Relative: 0 %
HCT: 23.8 % — ABNORMAL LOW (ref 39.0–52.0)
Hemoglobin: 7.8 g/dL — ABNORMAL LOW (ref 13.0–17.0)
Immature Granulocytes: 8 %
Lymphocytes Relative: 14 %
Lymphs Abs: 0.1 10*3/uL — ABNORMAL LOW (ref 0.7–4.0)
MCH: 28.3 pg (ref 26.0–34.0)
MCHC: 32.8 g/dL (ref 30.0–36.0)
MCV: 86.2 fL (ref 80.0–100.0)
Monocytes Absolute: 0 10*3/uL — ABNORMAL LOW (ref 0.1–1.0)
Monocytes Relative: 6 %
Neutro Abs: 0.5 10*3/uL — ABNORMAL LOW (ref 1.7–7.7)
Neutrophils Relative %: 72 %
Platelets: <5 10*3/uL — CL (ref 150–400)
RBC: 2.76 MIL/uL — ABNORMAL LOW (ref 4.22–5.81)
RDW: 17.6 % — ABNORMAL HIGH (ref 11.5–15.5)
WBC: 0.7 10*3/uL — CL (ref 4.0–10.5)
nRBC: 0 % (ref 0.0–0.2)

## 2018-08-15 LAB — COMPREHENSIVE METABOLIC PANEL
ALT: 19 U/L (ref 0–44)
AST: 39 U/L (ref 15–41)
Albumin: 1.5 g/dL — ABNORMAL LOW (ref 3.5–5.0)
Alkaline Phosphatase: 145 U/L — ABNORMAL HIGH (ref 38–126)
Anion gap: 9 (ref 5–15)
BUN: 32 mg/dL — ABNORMAL HIGH (ref 8–23)
CO2: 20 mmol/L — ABNORMAL LOW (ref 22–32)
Calcium: 7.4 mg/dL — ABNORMAL LOW (ref 8.9–10.3)
Chloride: 111 mmol/L (ref 98–111)
Creatinine, Ser: 1.31 mg/dL — ABNORMAL HIGH (ref 0.61–1.24)
GFR calc Af Amer: 59 mL/min — ABNORMAL LOW (ref >60)
GFR calc non Af Amer: 51 mL/min — ABNORMAL LOW (ref >60)
Glucose, Bld: 94 mg/dL (ref 70–99)
Potassium: 4.5 mmol/L (ref 3.5–5.1)
Sodium: 140 mmol/L (ref 135–145)
Total Bilirubin: 3 mg/dL — ABNORMAL HIGH (ref 0.3–1.2)
Total Protein: 5.3 g/dL — ABNORMAL LOW (ref 6.5–8.1)

## 2018-08-15 LAB — TYPE AND SCREEN
ABO/RH(D): O POS
Antibody Screen: POSITIVE
DAT, IgG: POSITIVE
Unit division: 0
Unit division: 0

## 2018-08-15 LAB — BPAM RBC
Blood Product Expiration Date: 202006152359
Blood Product Expiration Date: 202006152359
ISSUE DATE / TIME: 202005210642
ISSUE DATE / TIME: 202005210642
Unit Type and Rh: 5100
Unit Type and Rh: 5100

## 2018-08-15 LAB — URIC ACID: Uric Acid, Serum: 3.5 mg/dL — ABNORMAL LOW (ref 3.7–8.6)

## 2018-08-15 MED ORDER — DIPHENHYDRAMINE HCL 25 MG PO CAPS
25.0000 mg | ORAL_CAPSULE | Freq: Once | ORAL | Status: AC
  Administered 2018-08-15: 25 mg via ORAL
  Filled 2018-08-15: qty 1

## 2018-08-15 MED ORDER — WHITE PETROLATUM EX OINT
TOPICAL_OINTMENT | CUTANEOUS | Status: AC
  Administered 2018-08-15: 14:00:00
  Filled 2018-08-15: qty 28.35

## 2018-08-15 MED ORDER — FUROSEMIDE 10 MG/ML IJ SOLN
20.0000 mg | Freq: Once | INTRAMUSCULAR | Status: AC
  Administered 2018-08-15: 20 mg via INTRAVENOUS
  Filled 2018-08-15: qty 2

## 2018-08-15 MED ORDER — SODIUM CHLORIDE 0.9% IV SOLUTION
Freq: Once | INTRAVENOUS | Status: AC
  Administered 2018-08-15: 10:00:00 via INTRAVENOUS

## 2018-08-15 MED ORDER — ROMIPLOSTIM 250 MCG ~~LOC~~ SOLR: 2.0000 ug/kg | SUBCUTANEOUS | Status: DC

## 2018-08-15 MED ORDER — ROMIPLOSTIM 250 MCG ~~LOC~~ SOLR: 1.0000 ug/kg | SUBCUTANEOUS | Status: DC

## 2018-08-15 NOTE — Progress Notes (Signed)
PROGRESS NOTE  Jadden Yim LTJ:[id number] DOB: [birth year] DOA: 08/03/2018 PCP: Nicholas Lose, MD   LOS: 11 days   Brief narrative:  84 y.o. White ?  thrombocytopenia and hyperlipidemia; who presented with complaints of abdominal pain and worsening shortness of breath- Admitted to the hospital from 4/29-5/2 after being found to have splenomegaly with pancytopenia with imaging concerning for lymphoma.   Dr. Lindi Adie and ultimately the plan had been for an outpatient PET scan for targeted biopsy of a hypermetabolic site.   PET scan was not scheduled until the 19th of this month.  Re-presented with anorexia, fever, chills to emergency room on 5/11  afebrile, pulse 92-100, respirations 18-24, blood pressure 99/58-108/62, and O2 saturation maintained on room air. Patient was placed on 2 L nasal cannula oxygen for comfort.   Labs revealed WBC 1.9, platelets 18, sodium 133, CO2 16, BUN 73, creatinine 2.32, calcium 7.8, alkaline phosphatase 233, albumin 2.5, lipase 68, AST 66 , and total bilirubin 5.9.    Chest x-ray showed right lower lobe infiltrate concerning for possible pneumonia.  Patient was given 1 L normal saline IV fluids, vancomycin, and cefepime.  Patient was then considered for admission to the hospital.  Hospitalization has been complicated by severe pancytopenia and refractory ITP  Subjective:  Somewhat emotional today-talks to me about end-of-life planning and that "I do not just want only here when I can spend my last days with my family" We had a good discussion about his values and beliefs--mostly provided empathic listening He is not had any dark or tarry stool but is very weak when he moves around and he is not in pain--he is in the process of getting unit of platelets   Assessment/Plan:  Principal Problem:   ARF (acute renal failure) ([place]) Active Problems:   Thrombocytopenia (HCC)   Elevated LFTs   Hyperbilirubinemia   Splenomegaly   CAP (community acquired  pneumonia)   Dyspnea   Prolonged QT interval   Pancytopenia; 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 Lindi Adie following, appreciate assistance.  No active bleeding at this time.   --Platelet count 14-->10-->10-->5-->8-->11-->6--->13--->5 --Rituxan infusion on 08/07/2018 for ITP unresponsive to steroids --Platelet transfusion from 5/14 through 5/19 -- IVIG on 5/19 and 5/20 --Rituxan given Friday 5/22 --Nplate has been ordered, 1 more pack of platelets given appreciate oncology continue to follow-up  Hyperuricemia Uric acid level 10.9 on admission. --s/p rasburicase 6 mg IV on 08/04/2018 --Continue allopurinol--uric acid 3.5  Abdominal pain, secondary to splenomegaly:  Patient noted to have previous CT scan showing severe splenomegaly pretracheal lymph nodes and subcarinal lymph enlargement from 4/29. Oncology on board and had recommended PET scan which is scheduled for 5/19, to evaluate most hypermetabolic area for biopsy.   --Oncology d/w with general surgery regarding possible need for splenectomy, defer for now --continue oxycodone/fentanyl as needed for abdominal pain.   Acute kidney injury: resolved Etiology likely secondary to volume depletion with poor oral intake prior to hospitalization. likely secondary to volume depletion from poor oral intake.  Also consideration of hyperuricemia. Creatinine on admission 2.32. --continue with IV fluid hydration.   --Creatinine improving 2.32-->1.68-->1.39-->1.18-->1.35-->1.23-->1.04-->1.12->1.05-->1.31 --Avoid nephrotoxins, renally dose all medications --daily BMP  Dyspnea likely secondary to community-acquired pneumonia.   On Supplemental oxygen.  Will wean oxygen as able.  Chest x-ray showing signs of a ill-defined airspace opacity in the right medial lung base concerning for possible pneumonia.    Patient does have a large mediastinal lymphadenopathy  as well.  Afebrile  admission with a white blood cell count of 1.5. --COVID-19 negative --Urine Legionella antigen negative --MRSA PCR negative, discontinue vancomycin today  --s/p 7-day course of cefepime --Blood cultures no growth x 5 days --Titrate supplemental oxygen to maintain SPO2 greater than 92%--desat screen when able --Incentive spirometry --Continue supportive care.   Elevated bilirubin/elevated LFTs:  Bilirubin level has slightly improved.. Patient previously noted to have cholelithiasis without concerning symptoms for acute cholecystitis during last hospitalization. Hold further imaging at this time  Bilateral lower extremity edema, right upper extremity edema Ultrasound right upper extremity negative for DVT. -I have had to discontinue his IV Lasix because of hypotension on 5/20--likely related to IVIG infusions --daily weights, strict I/O's is -1.5 L --Encourage mobilization as is possible  Prolonged QT interval:  QTc503 on admission.  --avoid QT prolonging medications -Keep on telemetry  Hyperlipidemia. Continue to hold statin.  Tobacco abuse:  50-pack-year history.  Quit smoking 2 weeks back.  Emphasized to abstain from smoking.   VTE Prophylaxis: SCDs Code Status: Full Family Communication: Long discussion with patient at bedside today-he has interest in palliative and hospice level care and although he understands that this is a process, he feels that if there is no cure he would rather not suffer I will defer over the weekend decision making regarding palliative/hospice care however he is ready to make that transition and I suspect that could be as early as Tuesday that we could get him home with hospice if he decides to go down that route Disposition Plan: continue inpatient hospitalization until his primary oncologist can see him and decide on goals of care Consultants:  Oncology  Interventional radiology  Procedures:  IR bone marrow biopsy 08/05/2018   IVIG  infusion planned for 5/19 and 5/20  Antibiotics: Anti-infectives (From admission, onward)   Start     Dose/Rate Route Frequency Ordered Stop   08/05/18 1600  vancomycin (VANCOCIN) 1,250 mg in sodium chloride 0.9 % 250 mL IVPB  Status:  Discontinued     1,250 mg 166.7 mL/hr over 90 Minutes Intravenous Every 48 hours 08/03/18 1449 08/05/18 1248   08/05/18 1000  ceFEPIme (MAXIPIME) 2 g in sodium chloride 0.9 % 100 mL IVPB  Status:  Discontinued     2 g 200 mL/hr over 30 Minutes Intravenous Every 12 hours 08/05/18 0852 08/08/18 1010   08/04/18 1400  ceFEPIme (MAXIPIME) 2 g in sodium chloride 0.9 % 100 mL IVPB  Status:  Discontinued     2 g 200 mL/hr over 30 Minutes Intravenous Every 24 hours 08/03/18 1541 08/05/18 0852   08/03/18 1430  ceFEPIme (MAXIPIME) 2 g in sodium chloride 0.9 % 100 mL IVPB     2 g 200 mL/hr over 30 Minutes Intravenous  Once 08/03/18 1420 08/03/18 1512   08/03/18 1430  vancomycin (VANCOCIN) 1,500 mg in sodium chloride 0.9 % 500 mL IVPB     1,500 mg 250 mL/hr over 120 Minutes Intravenous  Once 08/03/18 1421 08/03/18 1744      Objective: Vitals:   08/15/18 0952 08/15/18 1007  BP: (!) 103/58 (!) 113/57  Pulse: 83 84  Resp: (!) 22 (!) 22  Temp: 97.7 F (36.5 C) 97.6 F (36.4 C)  SpO2: 98% 98%    Intake/Output Summary (Last 24 hours) at 08/15/2018 1342 Last data filed at 08/15/2018 0930 Gross per 24 hour  Intake 696 ml  Output 675 ml  Net 21 ml   Filed Weights   08/12/18 1440 08/14/18  0012 08/14/18 2048  Weight: 83.3 kg 82.8 kg 87.6 kg   Body mass index is 29.36 kg/m.   Physical Exam:  Pleasant awake alert no distress EOMI NCAT Chest is clear Abdomen is soft no rebound Lower extremities are slightly swollen No rales no rhonchi Malnourished appearing Poor dentition Coherent   Data Review: I have personally reviewed the following laboratory data and studies,  CBC: Recent Labs  Lab 08/10/18 0346 08/11/18 0350 08/12/18 1323 08/13/18 1538  08/14/18 0337 08/15/18 0408  WBC 0.4* 0.4* 0.3*  --  0.3* 0.7*  NEUTROABS 0.3* 0.2* 0.2*  --  0.2* 0.5*  HGB 7.9* 7.9* 7.0* 7.6* 7.0* 7.8*  HCT 25.0* 24.6* 22.4* 23.8* 21.5* 23.8*  MCV 84.2 83.7 85.2  --  84.6 86.2  PLT 11* 6* 9*  --  13* <5*   Basic Metabolic Panel: Recent Labs  Lab 08/10/18 0346 08/11/18 0350 08/12/18 1323 08/14/18 0337 08/15/18 0408  NA 139 141 140 141 140  K 4.6 4.6 3.9 4.1 4.5  CL 112* 110 111 111 111  CO2 21* 23 22 23  20*  GLUCOSE 137* 130* 118* 98 94  BUN 48* 43* 39* 32* 32*  CREATININE 1.23 1.04 1.12 1.05 1.31*  CALCIUM 7.2* 7.4* 7.5* 7.6* 7.4*  MG 2.6*  --   --   --   --    Liver Function Tests: Recent Labs  Lab 08/10/18 0346 08/11/18 0350 08/12/18 1323 08/14/18 0337 08/15/18 0408  AST 38 34 32 30 39  ALT 24 23 23 20 19   ALKPHOS 163* 160* 141* 146* 145*  BILITOT 1.5* 1.5* 1.4* 1.6* 3.0*  PROT 3.8* 4.0* 5.2* 5.5* 5.3*  ALBUMIN 1.7* 1.8* 1.8* 1.7* 1.5*   No results for input(s): LIPASE, AMYLASE in the last 168 hours. No results for input(s): AMMONIA in the last 168 hours. Cardiac Enzymes: No results for input(s): CKTOTAL, CKMB, CKMBINDEX, TROPONINI in the last 168 hours. BNP (last 3 results) Recent Labs    08/03/18 1303  BNP 73.0    ProBNP (last 3 results) No results for input(s): PROBNP in the last 8760 hours.  CBG: No results for input(s): GLUCAP in the last 168 hours.    Studies: No results found.  Scheduled Meds:  white petrolatum       sodium chloride   Intravenous Once   allopurinol  100 mg Oral BID   dexamethasone  4 mg Intravenous Q24H   docusate sodium  100 mg Oral BID   furosemide  20 mg Intravenous Once   guaiFENesin  600 mg Oral BID   [START ON 08/22/2018] romiPLOStim  2 mcg/kg Subcutaneous Weekly   Tbo-filgastrim (GRANIX) SQ  480 mcg Subcutaneous q1800   zolpidem  5 mg Oral QHS    Continuous Infusions:   Verneita Griffes, MD [facility] 1:42 PM

## 2018-08-15 NOTE — Progress Notes (Signed)
Indie Nickerson   DOB:1934/03/29   DG#:[id number]   VZD#:[id number]  Hem/Onc follow up note   Subjective: I am covering Dr. Lindi Adie to see him today. He received second dose weekly rituximab yesterday, had low grade fever towards the end of infusion, no chills, or other symptoms, was able to finish the infusion. He also received 1u PRBC last night, tolerated well. He was receiving plt transfusion this morning when I saw him, complains of being fatigued, mild abdominal distension and discomfort, no fever or bleeding.    Objective:  Vitals:   08/15/18 0952 08/15/18 1007  BP: (!) 103/58 (!) 113/57  Pulse: 83 84  Resp: (!) 22 (!) 22  Temp: 97.7 F (36.5 C) 97.6 F (36.4 C)  SpO2: 98% 98%    Body mass index is 29.36 kg/m.  Intake/Output Summary (Last 24 hours) at 08/15/2018 1153 Last data filed at 08/15/2018 0600 Gross per 24 hour  Intake 456 ml  Output 675 ml  Net -219 ml     Sclerae unicteric  Oropharynx clear  No peripheral adenopathy  Lungs clear -- no rales or rhonchi  Heart regular rate and rhythm  Abdomen mild distended, (+) splenomegaly and tenderness   MSK no focal spinal tenderness, mild b/l leg edema   Neuro nonfocal   CBG (last 3)  No results for input(s): GLUCAP in the last 72 hours.   Labs:   Urine Studies No results for input(s): UHGB, CRYS in the last 72 hours.  Invalid input(s): UACOL, UAPR, USPG, UPH, UTP, UGL, UKET, UBIL, UNIT, UROB, [place], UEPI, UWBC, Jun[place], [place], [place]  Basic Metabolic Panel: Recent Labs  Lab 08/10/18 0346 08/11/18 0350 08/12/18 1323 08/14/18 0337 08/15/18 0408  NA 139 141 140 141 140  K 4.6 4.6 3.9 4.1 4.5  CL 112* 110 111 111 111  CO2 21* 23 22 23  20*  GLUCOSE 137* 130* 118* 98 94  BUN 48* 43* 39* 32* 32*  CREATININE 1.23 1.04 1.12 1.05 1.31*  CALCIUM 7.2* 7.4* 7.5* 7.6* 7.4*  MG 2.6*  --   --   --   --    GFR Estimated Creatinine Clearance: 47.6 mL/min (A) (by C-G formula based on SCr of 1.31 mg/dL  (H)). Liver Function Tests: Recent Labs  Lab 08/10/18 0346 08/11/18 0350 08/12/18 1323 08/14/18 0337 08/15/18 0408  AST 38 34 32 30 39  ALT 24 23 23 20 19   ALKPHOS 163* 160* 141* 146* 145*  BILITOT 1.5* 1.5* 1.4* 1.6* 3.0*  PROT 3.8* 4.0* 5.2* 5.5* 5.3*  ALBUMIN 1.7* 1.8* 1.8* 1.7* 1.5*   No results for input(s): LIPASE, AMYLASE in the last 168 hours. No results for input(s): AMMONIA in the last 168 hours. Coagulation profile No results for input(s): INR, PROTIME in the last 168 hours.  CBC: Recent Labs  Lab 08/10/18 0346 08/11/18 0350 08/12/18 1323 08/13/18 1538 08/14/18 0337 08/15/18 0408  WBC 0.4* 0.4* 0.3*  --  0.3* 0.7*  NEUTROABS 0.3* 0.2* 0.2*  --  0.2* 0.5*  HGB 7.9* 7.9* 7.0* 7.6* 7.0* 7.8*  HCT 25.0* 24.6* 22.4* 23.8* 21.5* 23.8*  MCV 84.2 83.7 85.2  --  84.6 86.2  PLT 11* 6* 9*  --  13* <5*   Cardiac Enzymes: No results for input(s): CKTOTAL, CKMB, CKMBINDEX, TROPONINI in the last 168 hours. BNP: Invalid input(s): POCBNP CBG: No results for input(s): GLUCAP in the last 168 hours. D-Dimer No results for input(s): DDIMER in the last 72 hours. Hgb A1c No results  for input(s): HGBA1C in the last 72 hours. Lipid Profile No results for input(s): CHOL, HDL, LDLCALC, TRIG, CHOLHDL, LDLDIRECT in the last 72 hours. Thyroid function studies No results for input(s): TSH, T4TOTAL, T3FREE, THYROIDAB in the last 72 hours.  Invalid input(s): FREET3 Anemia work up No results for input(s): VITAMINB12, FOLATE, FERRITIN, TIBC, IRON, RETICCTPCT in the last 72 hours. Microbiology No results found for this or any previous visit (from the past 240 hour(s)).    Studies:  No results found.  Assessment: 84 y.o. male   1. 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 splenomagely 3. Diffuse mediastinal and abdominal adenopathy 4.  AKI 5.  Community-acquired pneumonia 6.  Moderate to severe calorie and protein malnutrition    Plan:  -Patient has  received 2 weekly doses of Rituxan, last dose yesterday, on dexamethasone 4 mg daily, and started Granix yesterday. He remains to be severely pancytopenic, agree with platelet transfusion, I would recommend 1 unit/day if plt<10, if no active bleeding.  If he does have severe active bleeding, he would need more platelet transfusion. -I will start him on Nplate 26mcg/kg weekly today, given his severe thrombocytopenia, poor response to steroid, IVIG, and Rituxan.  Potential benefit and side effects, risk of thrombosis, discussed with spoke with pharmacy today, will start today -Is low-grade fever was probably related to Rituxan infusion yesterday, no recurrent fever, blood culture was drawn yesterday.  Will follow.  He is neutropenic, if he has recurrent fever, will start him on broad antibiotics. -We do not have a definitive diagnosis for his adenopathy and splenomegaly. If pt agrees, may try EBUS or EUS lymph node diagnosis for more definitive diagnosis. I will defer this to Dr. Lindi Adie  -we will f/u closely. I appreciate the care from the hospitalist service.    Truitt Merle, MD 08/15/2018  11:53 AM

## 2018-08-16 DIAGNOSIS — T451X5A Adverse effect of antineoplastic and immunosuppressive drugs, initial encounter: Secondary | ICD-10-CM

## 2018-08-16 DIAGNOSIS — E883 Tumor lysis syndrome: Secondary | ICD-10-CM

## 2018-08-16 DIAGNOSIS — D701 Agranulocytosis secondary to cancer chemotherapy: Secondary | ICD-10-CM

## 2018-08-16 LAB — BPAM PLATELET PHERESIS
Blood Product Expiration Date: 202005240920
Blood Product Expiration Date: 202005242359
ISSUE DATE / TIME: 202005230948
Unit Type and Rh: 6200
Unit Type and Rh: 7300

## 2018-08-16 LAB — CBC WITH DIFFERENTIAL/PLATELET
Abs Immature Granulocytes: 0.08 10*3/uL — ABNORMAL HIGH (ref 0.00–0.07)
Basophils Absolute: 0 10*3/uL (ref 0.0–0.1)
Basophils Relative: 0 %
Eosinophils Absolute: 0 10*3/uL (ref 0.0–0.5)
Eosinophils Relative: 0 %
HCT: 23.5 % — ABNORMAL LOW (ref 39.0–52.0)
Hemoglobin: 7.5 g/dL — ABNORMAL LOW (ref 13.0–17.0)
Immature Granulocytes: 13 %
Lymphocytes Relative: 10 %
Lymphs Abs: 0.1 10*3/uL — ABNORMAL LOW (ref 0.7–4.0)
MCH: 27.9 pg (ref 26.0–34.0)
MCHC: 31.9 g/dL (ref 30.0–36.0)
MCV: 87.4 fL (ref 80.0–100.0)
Monocytes Absolute: 0 10*3/uL — ABNORMAL LOW (ref 0.1–1.0)
Monocytes Relative: 3 %
Neutro Abs: 0.4 10*3/uL — ABNORMAL LOW (ref 1.7–7.7)
Neutrophils Relative %: 74 %
Platelets: 7 10*3/uL — CL (ref 150–400)
RBC: 2.69 MIL/uL — ABNORMAL LOW (ref 4.22–5.81)
RDW: 18.4 % — ABNORMAL HIGH (ref 11.5–15.5)
WBC: 0.6 10*3/uL — CL (ref 4.0–10.5)
nRBC: 0 % (ref 0.0–0.2)

## 2018-08-16 LAB — COMPREHENSIVE METABOLIC PANEL
ALT: 19 U/L (ref 0–44)
AST: 33 U/L (ref 15–41)
Albumin: 1.7 g/dL — ABNORMAL LOW (ref 3.5–5.0)
Alkaline Phosphatase: 144 U/L — ABNORMAL HIGH (ref 38–126)
Anion gap: 9 (ref 5–15)
BUN: 34 mg/dL — ABNORMAL HIGH (ref 8–23)
CO2: 23 mmol/L (ref 22–32)
Calcium: 7.7 mg/dL — ABNORMAL LOW (ref 8.9–10.3)
Chloride: 109 mmol/L (ref 98–111)
Creatinine, Ser: 1.18 mg/dL (ref 0.61–1.24)
GFR calc Af Amer: >60 mL/min (ref >60)
GFR calc non Af Amer: 58 mL/min — ABNORMAL LOW (ref >60)
Glucose, Bld: 101 mg/dL — ABNORMAL HIGH (ref 70–99)
Potassium: 4.3 mmol/L (ref 3.5–5.1)
Sodium: 141 mmol/L (ref 135–145)
Total Bilirubin: 2.6 mg/dL — ABNORMAL HIGH (ref 0.3–1.2)
Total Protein: 5.2 g/dL — ABNORMAL LOW (ref 6.5–8.1)

## 2018-08-16 LAB — PREPARE PLATELET PHERESIS
Unit division: 0
Unit division: 0

## 2018-08-16 NOTE — Progress Notes (Addendum)
PROGRESS NOTE  Kveon Casanas XBL:[id number] DOB: 23-Nov-1934 DOA: 08/03/2018 PCP: Nicholas Lose, MD   LOS: 12 days   Brief narrative:  84 y.o. White ?  thrombocytopenia and hyperlipidemia; who presented with complaints of abdominal pain and worsening shortness of breath- Admitted to the hospital from 4/29-5/2 after being found to have splenomegaly with pancytopenia with imaging concerning for lymphoma.   Dr. Lindi Adie and ultimately the plan had been for an outpatient PET scan for targeted biopsy of a hypermetabolic site.   PET scan was not scheduled until the 19th of this month.  Re-presented with anorexia, fever, chills to emergency room on 5/11  afebrile, pulse 92-100, respirations 18-24, blood pressure 99/58-108/62, and O2 saturation maintained on room air.   WBC 1.9, platelets 18, sodium 133, CO2 16, BUN 73, creatinine 2.32, calcium 7.8, alkaline phosphatase 233, albumin 2.5, lipase 68, AST 66 ,  bilirubin 5.9.    Chest x-ray=right lower lobe infiltrate ? pneumonia.  Patient was given 1 L normal saline IV fluids, vancomycin, and cefepime.   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 Lindi Adie following, appreciate assistance.  No active bleeding at this time.   --Platelet count 14-->10-->10-->5-->8-->11-->6--->13--->5-->7 --Rituxan infusion on 08/07/2018 for ITP unresponsive to steroids --Platelet transfusion x 4 so far -- IVIG on 5/19 and 5/20--Rituxan  given Friday 5/22 --Nplate has been ordered per Dr. Burr Medico, continues on Granix --Oncology managing-? Goals of care and other modalities  Hyperuricemia Uric acid level 10.9 on admission. --s/p rasburicase 6 mg IV on 08/04/2018 --Continue allopurinol--uric acid 3.5  Abdominal pain, secondary to splenomegaly:  CT scan showing severe splenomegaly pretracheal lymph nodes and subcarinal lymph enlargement from 4/29. Oncology on board and had recommended PET scan which is scheduled for 5/19, to evaluate most hypermetabolic area for biopsy.   --Oncology d/w with general surgery regarding possible need for splenectomy, defer for now --stop opiates as no current abd pain  Acute kidney injury: resolved Etiology likely secondary to volume depletion with poor oral intake prior to hospitalization. likely secondary to volume depletion from poor oral intake.  Also consideration of hyperuricemia. Creatinine on admission 2.32.  Creat---2.32-->1.68-->1.39-->1.18-->1.35-->1.23-->1.04-->1.12->1.05-->1.31-->1.18 --Avoid nephrotoxins, renally dose all medications  Dyspnea likely secondary to community-acquired pneumonia.   On Supplemental oxygen.  Will wean oxygen as able.  Chest x-ray showing signs of a ill-defined airspace opacity in the right medial lung base concerning for possible pneumonia vs large mediastinal lymphadenopathy Afebrile admission with a white blood cell count of 1.5. --COVID-19 negative --Urine Legionella antigen negative --MRSA PCR negative --s/p 7-day course of cefepime --Blood cultures no growth x 5 days --Titrate supplemental oxygen to maintain SPO2 greater than 92%--desat screen when able --Incentive spirometry --Continue supportive care.   Elevated bilirubin/elevated LFTs:  Bilirubin level 5-->2.2. Patient previously noted to have cholelithiasis without concerning symptoms for acute cholecystitis during last hospitalization. Hold further imaging at this time  Bilateral lower  extremity edema, right upper extremity edema Ultrasound right upper extremity negative for DVT. -I have had to  discontinue his IV Lasix because of hypotension on 5/20--likely related to IVIG infusions --daily weights, strict I/O's is -1.5 L --Encourage mobilization as is possible  Prolonged QT interval:  QTc503 on admission.  -QTC has somewhat resolved -Keep on telemetry  Hyperlipidemia. Continue to hold statin.  Tobacco abuse:  50-pack-year history.  Quit smoking 2 weeks back.  Emphasized to abstain from smoking.   VTE Prophylaxis: SCDs Code Status: Full Family Communication: Discussed with patient alone on 5/24 he is weighing options Disposition Plan: continue inpatient hospitalization until his primary oncologist can see him and decide on goals of care Consultants:  Oncology  Interventional radiology  Procedures:  IR bone marrow biopsy 08/05/2018   IVIG infusion planned for 5/19 and 5/20  Antibiotics: Anti-infectives (From admission, onward)   Start     Dose/Rate Route Frequency Ordered Stop   08/05/18 1600  vancomycin (VANCOCIN) 1,250 mg in sodium chloride 0.9 % 250 mL IVPB  Status:  Discontinued     1,250 mg 166.7 mL/hr over 90 Minutes Intravenous Every 48 hours 08/03/18 1449 08/05/18 1248   08/05/18 1000  ceFEPIme (MAXIPIME) 2 g in sodium chloride 0.9 % 100 mL IVPB  Status:  Discontinued     2 g 200 mL/hr over 30 Minutes Intravenous Every 12 hours 08/05/18 0852 08/08/18 1010   08/04/18 1400  ceFEPIme (MAXIPIME) 2 g in sodium chloride 0.9 % 100 mL IVPB  Status:  Discontinued     2 g 200 mL/hr over 30 Minutes Intravenous Every 24 hours 08/03/18 1541 08/05/18 0852   08/03/18 1430  ceFEPIme (MAXIPIME) 2 g in sodium chloride 0.9 % 100 mL IVPB     2 g 200 mL/hr over 30 Minutes Intravenous  Once 08/03/18 1420 08/03/18 1512   08/03/18 1430  vancomycin (VANCOCIN) 1,500 mg in sodium chloride 0.9 % 500 mL IVPB     1,500 mg 250 mL/hr over 120 Minutes Intravenous  Once  08/03/18 1421 08/03/18 1744      Objective: Vitals:   08/16/18 0546 08/16/18 0935  BP: 125/63 (!) 126/59  Pulse: 81 87  Resp: 20 20  Temp: 98.2 F (36.8 C) 98.2 F (36.8 C)  SpO2: 97% 97%    Intake/Output Summary (Last 24 hours) at 08/16/2018 1312 Last data filed at 08/16/2018 0900 Gross per 24 hour  Intake 840 ml  Output 2250 ml  Net -1410 ml   Filed Weights   08/14/18 0012 08/14/18 2048 08/15/18 2110  Weight: 82.8 kg 87.6 kg 87.4 kg   Body mass index is 29.3 kg/m.   Physical Exam:  Awake coherent in nad eomi ncat abd soft nt-splenomegaly Bruising on bottom but mild Bruises on forearm is improved He is swollen  Data Review: I have personally reviewed the following laboratory data and studies,  CBC: Recent Labs  Lab 08/12/18 1323 08/13/18 1538 08/14/18 0337 08/15/18 0408 08/16/18 0404 08/17/18 0333  WBC 0.3*  --  0.3* 0.7* 0.6* 0.6*  NEUTROABS 0.2*  --  0.2* 0.5* 0.4* 0.4*  HGB 7.0* 7.6* 7.0* 7.8* 7.5* 7.6*  HCT 22.4* 23.8* 21.5* 23.8* 23.5* 24.0*  MCV 85.2  --  84.6 86.2 87.4 87.3  PLT 9*  --  13* <5* 7* <5*   Basic Metabolic Panel: Recent Labs  Lab 08/12/18 1323 08/14/18 0337 08/15/18 0408 08/16/18 0404 08/17/18 0333  NA 140 141 140 141 141  K 3.9 4.1 4.5 4.3 4.4  CL 111 111 111 109 109  CO2 22 23 20* 23 22  GLUCOSE 118*  98 94 101* 120*  BUN 39* 32* 32* 34* 37*  CREATININE 1.12 1.05 1.31* 1.18 1.09  CALCIUM 7.5* 7.6* 7.4* 7.7* 7.8*   Liver Function Tests: Recent Labs  Lab 08/12/18 1323 08/14/18 0337 08/15/18 0408 08/16/18 0404 08/17/18 0333  AST 32 30 39 33 31  ALT 23 20 19 19 18   ALKPHOS 141* 146* 145* 144* 142*  BILITOT 1.4* 1.6* 3.0* 2.6* 3.2*  PROT 5.2* 5.5* 5.3* 5.2* 5.0*  ALBUMIN 1.8* 1.7* 1.5* 1.7* 1.6*   No results for input(s): LIPASE, AMYLASE in the last 168 hours. No results for input(s): AMMONIA in the last 168 hours. Cardiac Enzymes: No results for input(s): CKTOTAL, CKMB, CKMBINDEX, TROPONINI in the last 168  hours. BNP (last 3 results) Recent Labs    08/03/18 1303  BNP 73.0    ProBNP (last 3 results) No results for input(s): PROBNP in the last 8760 hours.  CBG: No results for input(s): GLUCAP in the last 168 hours.    Studies: No results found.  Scheduled Meds: . sodium chloride   Intravenous Once  . allopurinol  100 mg Oral BID  . dexamethasone  4 mg Intravenous Q24H  . docusate sodium  100 mg Oral BID  . guaiFENesin  600 mg Oral BID  . [START ON 08/22/2018] romiPLOStim  2 mcg/kg Subcutaneous Weekly  . Tbo-filgastrim (GRANIX) SQ  480 mcg Subcutaneous q1800  . zolpidem  5 mg Oral QHS    Continuous Infusions:   Verneita Griffes, MD [facility] 1:12 PM

## 2018-08-16 NOTE — Progress Notes (Signed)
Kyndall Chaplin   DOB:[birth year]   LZ#:[id number]   TKW#:[id number]  Hem/Onc follow up note   Subjective: Pt had slept well last night, ambulated in hallway with assistance. No bleeding, afebrile, no other new complains.   Objective:  Vitals:   08/16/18 0546 08/16/18 0935  BP: 125/63 (!) 126/59  Pulse: 81 87  Resp: 20 20  Temp: 98.2 F (36.8 C) 98.2 F (36.8 C)  SpO2: 97% 97%    Body mass index is 29.3 kg/m.  Intake/Output Summary (Last 24 hours) at 08/16/2018 1319 Last data filed at 08/16/2018 0900 Gross per 24 hour  Intake 840 ml  Output 2250 ml  Net -1410 ml     Sclerae unicteric  Oropharynx clear  No peripheral adenopathy  Lungs clear -- no rales or rhonchi  Heart regular rate and rhythm  Abdomen mild distended, (+) splenomegaly and tenderness   MSK no focal spinal tenderness, mild b/l leg edema   Neuro nonfocal   CBG (last 3)  No results for input(s): GLUCAP in the last 72 hours.   Labs:   Urine Studies No results for input(s): UHGB, CRYS in the last 72 hours.  Invalid input(s): UACOL, UAPR, USPG, UPH, UTP, UGL, UKET, UBIL, UNIT, UROB, [place], UEPI, UWBC, Duwayne Heck [place], [place]  Basic Metabolic Panel: Recent Labs  Lab 08/10/18 0346 08/11/18 0350 08/12/18 1323 08/14/18 0337 08/15/18 0408 08/16/18 0404  NA 139 141 140 141 140 141  K 4.6 4.6 3.9 4.1 4.5 4.3  CL 112* 110 111 111 111 109  CO2 21* 23 22 23  20* 23  GLUCOSE 137* 130* 118* 98 94 101*  BUN 48* 43* 39* 32* 32* 34*  CREATININE 1.23 1.04 1.12 1.05 1.31* 1.18  CALCIUM 7.2* 7.4* 7.5* 7.6* 7.4* 7.7*  MG 2.6*  --   --   --   --   --    GFR Estimated Creatinine Clearance: 52.8 mL/min (by C-G formula based on SCr of 1.18 mg/dL). Liver Function Tests: Recent Labs  Lab 08/11/18 0350 08/12/18 1323 08/14/18 0337 08/15/18 0408 08/16/18 0404  AST 34 32 30 39 33  ALT 23 23 20 19 19   ALKPHOS 160* 141* 146* 145* 144*  BILITOT 1.5* 1.4* 1.6* 3.0* 2.6*  PROT 4.0* 5.2* 5.5* 5.3* 5.2*  ALBUMIN  1.8* 1.8* 1.7* 1.5* 1.7*   No results for input(s): LIPASE, AMYLASE in the last 168 hours. No results for input(s): AMMONIA in the last 168 hours. Coagulation profile No results for input(s): INR, PROTIME in the last 168 hours.  CBC: Recent Labs  Lab 08/11/18 0350 08/12/18 1323 08/13/18 1538 08/14/18 0337 08/15/18 0408 08/16/18 0404  WBC 0.4* 0.3*  --  0.3* 0.7* 0.6*  NEUTROABS 0.2* 0.2*  --  0.2* 0.5* 0.4*  HGB 7.9* 7.0* 7.6* 7.0* 7.8* 7.5*  HCT 24.6* 22.4* 23.8* 21.5* 23.8* 23.5*  MCV 83.7 85.2  --  84.6 86.2 87.4  PLT 6* 9*  --  13* <5* 7*   Cardiac Enzymes: No results for input(s): CKTOTAL, CKMB, CKMBINDEX, TROPONINI in the last 168 hours. BNP: Invalid input(s): POCBNP CBG: No results for input(s): GLUCAP in the last 168 hours. D-Dimer No results for input(s): DDIMER in the last 72 hours. Hgb A1c No results for input(s): HGBA1C in the last 72 hours. Lipid Profile No results for input(s): CHOL, HDL, LDLCALC, TRIG, CHOLHDL, LDLDIRECT in the last 72 hours. Thyroid function studies No results for input(s): TSH, T4TOTAL, T3FREE, THYROIDAB in the last 72 hours.  Invalid input(s):  FREET3 Anemia work up No results for input(s): VITAMINB12, FOLATE, FERRITIN, TIBC, IRON, RETICCTPCT in the last 72 hours. Microbiology Recent Results (from the past 240 hour(s))  Culture, blood (routine x 2)     Status: None (Preliminary result)   Collection Time: 08/14/18  7:29 PM  Result Value Ref Range Status   Specimen Description SITE NOT SPECIFIED  Final   Special Requests   Final    BOTTLES DRAWN AEROBIC ONLY Blood Culture results may not be optimal due to an inadequate volume of blood received in culture bottles   Culture   Final    NO GROWTH 2 DAYS Performed at [facility], [place] [address] [place], [place] [address]    Report Status PENDING  Incomplete  Culture, blood (routine x 2)     Status: None (Preliminary result)   Collection Time: 08/14/18  9:40 PM  Result Value  Ref Range Status   Specimen Description BLOOD RIGHT UPPER ARM  Final   Special Requests   Final    BOTTLES DRAWN AEROBIC AND ANAEROBIC Blood Culture adequate volume   Culture   Final    NO GROWTH 2 DAYS Performed at [facility], [address], [place], [place] [address]    Report Status PENDING  Incomplete      Studies:  No results found.  Assessment: 84 y.o. male   1. 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 splenomagely 3. Diffuse mediastinal and abdominal adenopathy 4.  AKI 5.  Community-acquired pneumonia 6.  Moderate to severe calorie and protein malnutrition    Plan:  -Patient has received 2 weekly doses of Rituxan. -continue dexa 4mg  daily -started weekly Nplate 20mcg/kg 0/24, continue daily granix  -Plt 7K today, consider 1u plt transfusion today  -I discussed EBUS or EUS lymph node biopsy for definitive diagnosis, possible high risk procedure due to severe pancytopenia, will need plt transfusion before procedure. Pulmonary previously wanted PET scan before biopsy, however pt will not be able to get PET, I think her subcarinal node is accessible by EBUS (3cm on CT).  We discussed further treatment will be depends on the definitive diagnosis from biopsy, intensive chemo may be difficult for him, targeted therapy is possible, again based on the diagnosis. If no biopsy and definitive diagnosis, pt is agreeable with hospice and comfort care at home. Pt is realistic, does have wife and son's support at home, and stated "I want to give myself a chance if treatment is available', he will discuss with his family about biopsy, before we call pulmonary   -my partner Dr. Irene Limbo is on call tomorrow, and Dr. Lindi Adie will resume care on Tuesday.   Truitt Merle, MD 08/16/2018  1:19 PM

## 2018-08-17 DIAGNOSIS — Z6829 Body mass index (BMI) 29.0-29.9, adult: Secondary | ICD-10-CM

## 2018-08-17 DIAGNOSIS — E43 Unspecified severe protein-calorie malnutrition: Secondary | ICD-10-CM

## 2018-08-17 LAB — CBC WITH DIFFERENTIAL/PLATELET
Abs Immature Granulocytes: 0.11 10*3/uL — ABNORMAL HIGH (ref 0.00–0.07)
Basophils Absolute: 0 10*3/uL (ref 0.0–0.1)
Basophils Relative: 0 %
Eosinophils Absolute: 0 10*3/uL (ref 0.0–0.5)
Eosinophils Relative: 0 %
HCT: 24 % — ABNORMAL LOW (ref 39.0–52.0)
Hemoglobin: 7.6 g/dL — ABNORMAL LOW (ref 13.0–17.0)
Immature Granulocytes: 18 %
Lymphocytes Relative: 10 %
Lymphs Abs: 0.1 10*3/uL — ABNORMAL LOW (ref 0.7–4.0)
MCH: 27.6 pg (ref 26.0–34.0)
MCHC: 31.7 g/dL (ref 30.0–36.0)
MCV: 87.3 fL (ref 80.0–100.0)
Monocytes Absolute: 0 10*3/uL — ABNORMAL LOW (ref 0.1–1.0)
Monocytes Relative: 3 %
Neutro Abs: 0.4 10*3/uL — ABNORMAL LOW (ref 1.7–7.7)
Neutrophils Relative %: 69 %
Platelets: <5 10*3/uL — CL (ref 150–400)
RBC: 2.75 MIL/uL — ABNORMAL LOW (ref 4.22–5.81)
RDW: 18.6 % — ABNORMAL HIGH (ref 11.5–15.5)
WBC: 0.6 10*3/uL — CL (ref 4.0–10.5)
nRBC: 0 % (ref 0.0–0.2)

## 2018-08-17 LAB — COMPREHENSIVE METABOLIC PANEL
ALT: 18 U/L (ref 0–44)
AST: 31 U/L (ref 15–41)
Albumin: 1.6 g/dL — ABNORMAL LOW (ref 3.5–5.0)
Alkaline Phosphatase: 142 U/L — ABNORMAL HIGH (ref 38–126)
Anion gap: 10 (ref 5–15)
BUN: 37 mg/dL — ABNORMAL HIGH (ref 8–23)
CO2: 22 mmol/L (ref 22–32)
Calcium: 7.8 mg/dL — ABNORMAL LOW (ref 8.9–10.3)
Chloride: 109 mmol/L (ref 98–111)
Creatinine, Ser: 1.09 mg/dL (ref 0.61–1.24)
GFR calc Af Amer: >60 mL/min (ref >60)
GFR calc non Af Amer: >60 mL/min (ref >60)
Glucose, Bld: 120 mg/dL — ABNORMAL HIGH (ref 70–99)
Potassium: 4.4 mmol/L (ref 3.5–5.1)
Sodium: 141 mmol/L (ref 135–145)
Total Bilirubin: 3.2 mg/dL — ABNORMAL HIGH (ref 0.3–1.2)
Total Protein: 5 g/dL — ABNORMAL LOW (ref 6.5–8.1)

## 2018-08-17 LAB — BILIRUBIN, DIRECT: Bilirubin, Direct: 1.8 mg/dL — ABNORMAL HIGH (ref 0.0–0.2)

## 2018-08-17 LAB — IMMATURE PLATELET FRACTION: Immature Platelet Fraction: 2.3 % (ref 1.2–8.6)

## 2018-08-17 MED ORDER — ACETAMINOPHEN 325 MG PO TABS
650.0000 mg | ORAL_TABLET | Freq: Once | ORAL | Status: AC
  Administered 2018-08-17: 650 mg via ORAL
  Filled 2018-08-17: qty 2

## 2018-08-17 MED ORDER — DIPHENHYDRAMINE HCL 25 MG PO CAPS
25.0000 mg | ORAL_CAPSULE | Freq: Once | ORAL | Status: AC
  Administered 2018-08-17: 25 mg via ORAL
  Filled 2018-08-17: qty 1

## 2018-08-17 MED ORDER — SODIUM CHLORIDE 0.9% IV SOLUTION: Freq: Once | INTRAVENOUS | Status: DC

## 2018-08-17 MED ORDER — FUROSEMIDE 10 MG/ML IJ SOLN
20.0000 mg | Freq: Once | INTRAMUSCULAR | Status: AC
  Administered 2018-08-17: 20 mg via INTRAVENOUS
  Filled 2018-08-17: qty 2

## 2018-08-17 NOTE — Progress Notes (Signed)
OT Cancellation Note  Patient Details Name: Zachary Bennett MRN: [id number] DOB: February 05, 1935   Cancelled Treatment:    Reason Eval/Treat Not Completed: Other (comment). Pt reports he was up earlier, now getting IV and he is not up to getting up a present time.  [place], OTR/L Acute Rehab Services Pager [phone] Office [phone]     Almon Register 08/17/2018, 1:36 PM

## 2018-08-17 NOTE — Progress Notes (Addendum)
PROGRESS NOTE  Zachary Bennett EYC:[id number] DOB: December 29, 1934 DOA: 08/03/2018 PCP: Nicholas Lose, MD   LOS: 13 days   Brief narrative:  84 y.o. White ?  thrombocytopenia and hyperlipidemia; who presented with complaints of abdominal pain and worsening shortness of breath- Admitted to the hospital from 4/29-5/2 after being found to have splenomegaly with pancytopenia with imaging concerning for lymphoma.   Dr. Lindi Adie and ultimately the plan had been for an outpatient PET scan for targeted biopsy of a hypermetabolic site.   PET scan was not scheduled until the 19th of this month.  Re-presented with anorexia, fever, chills to emergency room on 5/11  afebrile, pulse 92-100, respirations 18-24, blood pressure 99/58-108/62, and O2 saturation maintained on room air.   WBC 1.9, platelets 18, sodium 133, CO2 16, BUN 73, creatinine 2.32, calcium 7.8, alkaline phosphatase 233, albumin 2.5, lipase 68, AST 66 ,  bilirubin 5.9.    Chest x-ray=right lower lobe infiltrate ? pneumonia.  Patient was given 1 L normal saline IV fluids, vancomycin, and cefepime.   Hospitalization has been complicated by severe pancytopenia and refractory ITP-which has been managed by Oncology  Subjective:  Doing ok Being bathed SOB with movement Too tired to work with PT today  no fever no cp  Assessment/Plan:  Principal Problem:   ARF (acute renal failure) (HCC) Active Problems:   Thrombocytopenia (HCC)   Elevated LFTs   Hyperbilirubinemia   Splenomegaly   CAP (community acquired pneumonia)   Dyspnea   Prolonged QT interval   Chemotherapy-induced neutropenia (HCC)   Tumor lysis syndrome   Pancytopenia; 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 Lindi Adie following, appreciate assistance.  No active bleeding at this time.   --Platelet count 14-->10-->10-->5-->8-->11-->6--->13--->5-->7-->5 --Rituxan infusion on 08/07/2018 for ITP unresponsive to  steroids --Platelet transfusion 5/14, 5/19,5/20,5/23,5/25 -- IVIG on 5/19 and 5/20--Rituxan given Friday 5/22 --Nplate has been ordered per Dr. Burr Medico, continues on Granix --Oncology managing-? Goals of care and other modalities  Hyperuricemia Uric acid level 10.9 on admission. --s/p rasburicase 6 mg IV on 08/04/2018 --Continue allopurinol--uric acid 3.5, recheck in am  Abdominal pain, secondary to splenomegaly:  CT scan showing severe splenomegaly pretracheal lymph nodes and subcarinal lymph enlargement from 4/29. Oncology on board and had recommended PET scan which is scheduled for 5/19, to evaluate most hypermetabolic area for biopsy.   --Oncology d/w with general surgery regarding possible need for splenectomy, defer for now --stop opiates as no current abd pain  Acute kidney injury: resolved Etiology likely secondary to volume depletion with poor oral intake prior to hospitalization. likely secondary to volume depletion from poor oral intake.  Also consideration of hyperuricemia. Creatinine on admission 2.32. Saline locked Creat---2.32-->1.68-->1.39-->1.18-->1.35-->1.23-->1.04-->1.12->1.05-->1.31-->1.18-->1.09 --Avoid nephrotoxins, renally dose all medications  Dyspnea likely secondary to community-acquired pneumonia.   On Supplemental oxygen.  Will wean oxygen as able.  Chest x-ray showing signs of a ill-defined airspace opacity in the right medial lung base concerning for possible pneumonia vs large mediastinal lymphadenopathy Afebrile admission with a white blood cell count of 1.5. --COVID-19 negative --Urine Legionella antigen negative --MRSA PCR negative --s/p 7-day course of cefepime --Blood cultures no growth x 5 days --oxygen for comfort--hasnt really needed  Elevated bilirubin/elevated LFTs:  Bilirubin level 5-->2.2. Patient previously noted to have cholelithiasis without concerning symptoms for acute cholecystitis during last hospitalization. Hold further imaging at this  time  Bilateral lower extremity edema, right upper extremity edema Ultrasound right upper extremity negative for DVT. -I have  had to discontinue his IV Lasix because of hypotension on 5/20--likely related to IVIG infusions --daily weights, strict I/O's is -1.5 L --Encourage mobilization as is possible  Prolonged QT interval:  QTc503 on admission.  -QTC has somewhat resolved -Keep on telemetry  Hyperlipidemia. Continue to hold statin.  Tobacco abuse:  50-pack-year history.  Quit smoking 2 weeks back.  Emphasized to abstain from smoking.   VTE Prophylaxis: SCDs Code Status: Full Family Communication: await discussion re: options from primary oncologist on 5/26 Disposition Plan: continue inpatient hospitalization until his primary oncologist can see him and decide on goals of care Consultants:  Oncology  Interventional radiology  Procedures:  IR bone marrow biopsy 08/05/2018   IVIG infusion planned for 5/19 and 5/20  Antibiotics: Anti-infectives (From admission, onward)   Start     Dose/Rate Route Frequency Ordered Stop   08/05/18 1600  vancomycin (VANCOCIN) 1,250 mg in sodium chloride 0.9 % 250 mL IVPB  Status:  Discontinued     1,250 mg 166.7 mL/hr over 90 Minutes Intravenous Every 48 hours 08/03/18 1449 08/05/18 1248   08/05/18 1000  ceFEPIme (MAXIPIME) 2 g in sodium chloride 0.9 % 100 mL IVPB  Status:  Discontinued     2 g 200 mL/hr over 30 Minutes Intravenous Every 12 hours 08/05/18 0852 08/08/18 1010   08/04/18 1400  ceFEPIme (MAXIPIME) 2 g in sodium chloride 0.9 % 100 mL IVPB  Status:  Discontinued     2 g 200 mL/hr over 30 Minutes Intravenous Every 24 hours 08/03/18 1541 08/05/18 0852   08/03/18 1430  ceFEPIme (MAXIPIME) 2 g in sodium chloride 0.9 % 100 mL IVPB     2 g 200 mL/hr over 30 Minutes Intravenous  Once 08/03/18 1420 08/03/18 1512   08/03/18 1430  vancomycin (VANCOCIN) 1,500 mg in sodium chloride 0.9 % 500 mL IVPB     1,500 mg 250 mL/hr over 120  Minutes Intravenous  Once 08/03/18 1421 08/03/18 1744      Objective: Vitals:   08/17/18 1046 08/17/18 1315  BP: 116/61 116/62  Pulse: 79 85  Resp: 20 (!) 24  Temp: 97.8 F (36.6 C) 97.7 F (36.5 C)  SpO2: 98% 97%    Intake/Output Summary (Last 24 hours) at 08/17/2018 1631 Last data filed at 08/17/2018 1601 Gross per 24 hour  Intake 868 ml  Output 2100 ml  Net -1232 ml   Filed Weights   08/14/18 2048 08/15/18 2110 08/16/18 2028  Weight: 87.6 kg 87.4 kg 87.3 kg   Body mass index is 29.26 kg/m.   Physical Exam:  Awake coherent in nad eomi ncat abd soft nt-splenomegaly Bruising on bottom but mild Bruises on forearm is improved He is swollen--grade 2 LE edema  Data Review: I have personally reviewed the following laboratory data and studies,  CBC: Recent Labs  Lab 08/12/18 1323 08/13/18 1538 08/14/18 0337 08/15/18 0408 08/16/18 0404 08/17/18 0333  WBC 0.3*  --  0.3* 0.7* 0.6* 0.6*  NEUTROABS 0.2*  --  0.2* 0.5* 0.4* 0.4*  HGB 7.0* 7.6* 7.0* 7.8* 7.5* 7.6*  HCT 22.4* 23.8* 21.5* 23.8* 23.5* 24.0*  MCV 85.2  --  84.6 86.2 87.4 87.3  PLT 9*  --  13* <5* 7* <5*   Basic Metabolic Panel: Recent Labs  Lab 08/12/18 1323 08/14/18 0337 08/15/18 0408 08/16/18 0404 08/17/18 0333  NA 140 141 140 141 141  K 3.9 4.1 4.5 4.3 4.4  CL 111 111 111 109 109  CO2 22 23 20* 23  22  GLUCOSE 118* 98 94 101* 120*  BUN 39* 32* 32* 34* 37*  CREATININE 1.12 1.05 1.31* 1.18 1.09  CALCIUM 7.5* 7.6* 7.4* 7.7* 7.8*   Liver Function Tests: Recent Labs  Lab 08/12/18 1323 08/14/18 0337 08/15/18 0408 08/16/18 0404 08/17/18 0333  AST 32 30 39 33 31  ALT 23 20 19 19 18   ALKPHOS 141* 146* 145* 144* 142*  BILITOT 1.4* 1.6* 3.0* 2.6* 3.2*  PROT 5.2* 5.5* 5.3* 5.2* 5.0*  ALBUMIN 1.8* 1.7* 1.5* 1.7* 1.6*   No results for input(s): LIPASE, AMYLASE in the last 168 hours. No results for input(s): AMMONIA in the last 168 hours. Cardiac Enzymes: No results for input(s): CKTOTAL,  CKMB, CKMBINDEX, TROPONINI in the last 168 hours. BNP (last 3 results) Recent Labs    08/03/18 1303  BNP 73.0    ProBNP (last 3 results) No results for input(s): PROBNP in the last 8760 hours.  CBG: No results for input(s): GLUCAP in the last 168 hours.    Studies: No results found.  Scheduled Meds: . sodium chloride   Intravenous Once  . sodium chloride   Intravenous Once  . allopurinol  100 mg Oral BID  . dexamethasone  4 mg Intravenous Q24H  . docusate sodium  100 mg Oral BID  . guaiFENesin  600 mg Oral BID  . [START ON 08/22/2018] romiPLOStim  2 mcg/kg Subcutaneous Weekly  . Tbo-filgastrim (GRANIX) SQ  480 mcg Subcutaneous q1800  . zolpidem  5 mg Oral QHS    Continuous Infusions:   Verneita Griffes, MD [facility] 4:31 PM

## 2018-08-17 NOTE — Progress Notes (Signed)
PT Cancellation Note  Patient Details Name: Zachary Bennett MRN: [id number] DOB: [birth year]   Cancelled Treatment:    Reason Eval/Treat Not Completed: Fatigue/lethargy limiting ability to participate   Duncan Dull 08/17/2018, 1:38 PM

## 2018-08-17 NOTE — Care Management Important Message (Signed)
Important Message  Patient Details  Name: Zachary Bennett MRN: [id number] Date of Birth: April 13, 1934   Medicare Important Message Given:  Yes    Carleigh Buccieri 08/17/2018, 3:30 PM

## 2018-08-18 LAB — BPAM PLATELET PHERESIS
Blood Product Expiration Date: 202005262359
Blood Product Expiration Date: 202005272359
ISSUE DATE / TIME: 202005251019
ISSUE DATE / TIME: 202005261518
Unit Type and Rh: 6200
Unit Type and Rh: 600

## 2018-08-18 LAB — CBC WITH DIFFERENTIAL/PLATELET
Abs Immature Granulocytes: 0.05 10*3/uL (ref 0.00–0.07)
Basophils Absolute: 0 10*3/uL (ref 0.0–0.1)
Basophils Relative: 0 %
Eosinophils Absolute: 0 10*3/uL (ref 0.0–0.5)
Eosinophils Relative: 0 %
HCT: 23.5 % — ABNORMAL LOW (ref 39.0–52.0)
Hemoglobin: 7.4 g/dL — ABNORMAL LOW (ref 13.0–17.0)
Immature Granulocytes: 8 %
Lymphocytes Relative: 17 %
Lymphs Abs: 0.1 10*3/uL — ABNORMAL LOW (ref 0.7–4.0)
MCH: 27.6 pg (ref 26.0–34.0)
MCHC: 31.5 g/dL (ref 30.0–36.0)
MCV: 87.7 fL (ref 80.0–100.0)
Monocytes Absolute: 0 10*3/uL — ABNORMAL LOW (ref 0.1–1.0)
Monocytes Relative: 5 %
Neutro Abs: 0.5 10*3/uL — ABNORMAL LOW (ref 1.7–7.7)
Neutrophils Relative %: 70 %
Platelets: 5 10*3/uL — CL (ref 150–400)
RBC: 2.68 MIL/uL — ABNORMAL LOW (ref 4.22–5.81)
RDW: 18.6 % — ABNORMAL HIGH (ref 11.5–15.5)
WBC: 0.7 10*3/uL — CL (ref 4.0–10.5)
nRBC: 0 % (ref 0.0–0.2)

## 2018-08-18 LAB — PREPARE PLATELET PHERESIS
Unit division: 0
Unit division: 0

## 2018-08-18 LAB — URIC ACID: Uric Acid, Serum: 3.9 mg/dL (ref 3.7–8.6)

## 2018-08-18 MED ORDER — DIPHENHYDRAMINE HCL 25 MG PO CAPS: 25.0000 mg | ORAL_CAPSULE | Freq: Once | ORAL | Status: DC

## 2018-08-18 MED ORDER — MORPHINE SULFATE (CONCENTRATE) 10 MG /0.5 ML PO SOLN: 10.0000 mg | ORAL | 0 refills | Status: AC | PRN

## 2018-08-18 MED ORDER — ACETAMINOPHEN 325 MG PO TABS: 650.0000 mg | ORAL_TABLET | Freq: Once | ORAL | Status: DC

## 2018-08-18 MED ORDER — METOPROLOL TARTRATE 5 MG/5ML IV SOLN
2.5000 mg | Freq: Four times a day (QID) | INTRAVENOUS | Status: DC
  Filled 2018-08-18 (×2): qty 5

## 2018-08-18 MED ORDER — MORPHINE SULFATE (PF) 2 MG/ML IV SOLN
2.0000 mg | INTRAVENOUS | Status: DC | PRN
  Administered 2018-08-19 (×2): 2 mg via INTRAVENOUS
  Filled 2018-08-18 (×2): qty 1

## 2018-08-18 MED ORDER — SODIUM CHLORIDE 0.9% IV SOLUTION: Freq: Once | INTRAVENOUS | Status: DC

## 2018-08-18 NOTE — Progress Notes (Signed)
PROGRESS NOTE  Zachary Bennett FAO:[id number] DOB: [birth year] DOA: 08/03/2018 PCP: Nicholas Lose, MD   LOS: 14 days   Brief narrative:  84 y.o. White ?  thrombocytopenia and hyperlipidemia; who presented with complaints of abdominal pain and worsening shortness of breath- Admitted to the hospital from 4/29-5/2 after being found to have splenomegaly with pancytopenia with imaging concerning for lymphoma.   Dr. Lindi Adie and ultimately the plan had been for an outpatient PET scan for targeted biopsy of a hypermetabolic site.   PET scan was not scheduled until the 19th of this month.  Re-presented with anorexia, fever, chills to emergency room on 5/11  afebrile, pulse 92-100, respirations 18-24, blood pressure 99/58-108/62, and O2 saturation maintained on room air.   WBC 1.9, platelets 18, sodium 133, CO2 16, BUN 73, creatinine 2.32, calcium 7.8, alkaline phosphatase 233, albumin 2.5, lipase 68, AST 66 ,  bilirubin 5.9.    Chest x-ray=right lower lobe infiltrate ? pneumonia.  Patient was given 1 L normal saline IV fluids, vancomycin, and cefepime.   Hospitalization has been complicated by severe pancytopenia and refractory ITP-which has been managed by Oncology  We are in the process of coordinating with oncology goals of care which will happen later this evening He probably may benefit from discussion with hospice services about choice and may benefit from home hospice more so than freestanding hospice facility if this decision is consolidated today  Subjective:  In good spirits but realistic asks me if he is going to get platelet transfusion I explained to him his primary oncologist will discuss what other options might be available  Assessment/Plan:  Principal Problem:   ARF (acute renal failure) ([place]) Active Problems:   Thrombocytopenia (HCC)   Elevated LFTs   Hyperbilirubinemia   Splenomegaly   CAP (community acquired pneumonia)   Dyspnea   Prolonged QT interval  Chemotherapy-induced neutropenia (HCC)   Tumor lysis syndrome   Pancytopenia; 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 Lindi Adie following, appreciate assistance.  No active bleeding at this time.   --Platelet count 14-->10-->10-->5-->8-->11-->6--->13--->5-->7-->5---I have held on further platelet transfusions as of 5/26 as I do not think they are making a difference and I discussed this with Dr. Lindi Adie and Ms Mena Pauls who will manage goals of care with patient today-much appreciate input --Rituxan infusion on 08/07/2018 for ITP unresponsive to steroids --Platelet transfusion 5/14, 5/19,5/20,5/23,5/25 -- IVIG on 5/19 and 5/20--Rituxan given Friday 5/22 --Nplate has been ordered per Dr. Burr Medico, continues on Granix  Hyperuricemia Uric acid level 10.9 on admission. --s/p rasburicase 6 mg IV on 08/04/2018 --Continue allopurinol--uric acid 3.9  Abdominal pain, secondary to splenomegaly:  CT scan showing severe splenomegaly pretracheal lymph nodes and subcarinal lymph enlargement from 4/29. Oncology on board and had recommended PET scan which is scheduled for 5/19, to evaluate most hypermetabolic area for biopsy.   --Oncology d/w with general surgery regarding possible need for splenectomy---I am quite doubtful that this would be feasible given his deconditioned state and likely poor outcome with surgery --stop opiates as no current abd pain  Acute kidney injury: resolved Etiology likely secondary to volume depletion with poor oral intake prior to hospitalization. likely secondary to volume depletion from poor oral intake.  Also consideration of hyperuricemia. Creatinine on admission 2.32. Saline locked Creat---2.32-->1.68-->1.39-->1.18-->1.35-->1.23-->1.04-->1.12->1.05-->1.31-->1.18-->1.09 --Avoid nephrotoxins, renally dose all medications  Dyspnea likely secondary to community-acquired pneumonia.   On Supplemental oxygen.  Will wean  oxygen as able.  Chest x-ray showing signs of  a ill-defined airspace opacity in the right medial lung base concerning for possible pneumonia vs large mediastinal lymphadenopathy Afebrile admission with a white blood cell count of 1.5. --COVID-19 negative --Urine Legionella antigen negative --MRSA PCR negative --s/p 7-day course of cefepime --Blood cultures no growth x 5 days --oxygen for comfort--hasnt really needed  Elevated bilirubin/elevated LFTs:  Bilirubin level 5-->2.2-->1.8 on 5/25. Patient previously noted to have cholelithiasis without concerning symptoms for acute cholecystitis during last hospitalization. Hold further imaging at this time  Bilateral lower extremity edema, right upper extremity edema Ultrasound right upper extremity negative for DVT. -I have had to discontinue his IV Lasix because of hypotension on 5/20--likely related to IVIG infusions --daily weights, strict I/O's is - -2.0 L but does have grade 3 lower extremity edema-again would not diurese --Encourage mobilization as is possible  Prolonged QT interval Sinus tach on Coumadin with PVCs 100 range QTc503 on admission.  -QTC has somewhat resolved -Keep on telemetry overnight and if hospice trajectory obtained with DC the same and let him be comfortable--he is a poor candidate for rate control given his hypotension and it would be risky to use digoxin in his case-for now only use metoprolol 2.5 every 6 as needed if heart rate above 120  Hyperlipidemia. Continue to hold statin.  Tobacco abuse:  50-pack-year history.  Quit smoking 2 weeks back.  Emphasized to abstain from smoking.   VTE Prophylaxis: SCDs Code Status: Full Family Communication: await discussion re: options from primary oncologist on 5/26 Disposition Plan: ?  Home hospice Consultants:  Oncology  Interventional radiology  Procedures:  IR bone marrow biopsy 08/05/2018   IVIG infusion planned for 5/19 and  5/20  Antibiotics: Anti-infectives (From admission, onward)   Start     Dose/Rate Route Frequency Ordered Stop   08/05/18 1600  vancomycin (VANCOCIN) 1,250 mg in sodium chloride 0.9 % 250 mL IVPB  Status:  Discontinued     1,250 mg 166.7 mL/hr over 90 Minutes Intravenous Every 48 hours 08/03/18 1449 08/05/18 1248   08/05/18 1000  ceFEPIme (MAXIPIME) 2 g in sodium chloride 0.9 % 100 mL IVPB  Status:  Discontinued     2 g 200 mL/hr over 30 Minutes Intravenous Every 12 hours 08/05/18 0852 08/08/18 1010   08/04/18 1400  ceFEPIme (MAXIPIME) 2 g in sodium chloride 0.9 % 100 mL IVPB  Status:  Discontinued     2 g 200 mL/hr over 30 Minutes Intravenous Every 24 hours 08/03/18 1541 08/05/18 0852   08/03/18 1430  ceFEPIme (MAXIPIME) 2 g in sodium chloride 0.9 % 100 mL IVPB     2 g 200 mL/hr over 30 Minutes Intravenous  Once 08/03/18 1420 08/03/18 1512   08/03/18 1430  vancomycin (VANCOCIN) 1,500 mg in sodium chloride 0.9 % 500 mL IVPB     1,500 mg 250 mL/hr over 120 Minutes Intravenous  Once 08/03/18 1421 08/03/18 1744      Objective: Vitals:   08/18/18 0430 08/18/18 0900  BP: 116/63 111/78  Pulse: 78 83  Resp: 16 (!) 24  Temp:  97.7 F (36.5 C)  SpO2: 96% 98%    Intake/Output Summary (Last 24 hours) at 08/18/2018 1706 Last data filed at 08/18/2018 1300 Gross per 24 hour  Intake 300 ml  Output 750 ml  Net -450 ml   Filed Weights   08/14/18 2048 08/15/18 2110 08/16/18 2028  Weight: 87.6 kg 87.4 kg 87.3 kg   Body mass index is 29.26 kg/m.   Physical Exam:  Awake  coherent no distress seems to be in good spirits got some rest this afternoon Walking just to the restroom winds him immensely Chest is clear no added sounds 2+ lower extremity edema Bruises on forearms are better S1-S2 no murmur Neurologically intact Bitemporal wasting  Data Review: I have personally reviewed the following laboratory data and studies,  CBC: Recent Labs  Lab 08/14/18 0337 08/15/18 0408  08/16/18 0404 08/17/18 0333 08/18/18 0339  WBC 0.3* 0.7* 0.6* 0.6* 0.7*  NEUTROABS 0.2* 0.5* 0.4* 0.4* 0.5*  HGB 7.0* 7.8* 7.5* 7.6* 7.4*  HCT 21.5* 23.8* 23.5* 24.0* 23.5*  MCV 84.6 86.2 87.4 87.3 87.7  PLT 13* <5* 7* <5* 5*   Basic Metabolic Panel: Recent Labs  Lab 08/12/18 1323 08/14/18 0337 08/15/18 0408 08/16/18 0404 08/17/18 0333  NA 140 141 140 141 141  K 3.9 4.1 4.5 4.3 4.4  CL 111 111 111 109 109  CO2 22 23 20* 23 22  GLUCOSE 118* 98 94 101* 120*  BUN 39* 32* 32* 34* 37*  CREATININE 1.12 1.05 1.31* 1.18 1.09  CALCIUM 7.5* 7.6* 7.4* 7.7* 7.8*   Liver Function Tests: Recent Labs  Lab 08/12/18 1323 08/14/18 0337 08/15/18 0408 08/16/18 0404 08/17/18 0333  AST 32 30 39 33 31  ALT 23 20 19 19 18   ALKPHOS 141* 146* 145* 144* 142*  BILITOT 1.4* 1.6* 3.0* 2.6* 3.2*  PROT 5.2* 5.5* 5.3* 5.2* 5.0*  ALBUMIN 1.8* 1.7* 1.5* 1.7* 1.6*   No results for input(s): LIPASE, AMYLASE in the last 168 hours. No results for input(s): AMMONIA in the last 168 hours. Cardiac Enzymes: No results for input(s): CKTOTAL, CKMB, CKMBINDEX, TROPONINI in the last 168 hours. BNP (last 3 results) Recent Labs    08/03/18 1303  BNP 73.0    ProBNP (last 3 results) No results for input(s): PROBNP in the last 8760 hours.  CBG: No results for input(s): GLUCAP in the last 168 hours.    Studies: No results found.  Scheduled Meds:  sodium chloride   Intravenous Once   sodium chloride   Intravenous Once   sodium chloride   Intravenous Once   allopurinol  100 mg Oral BID   dexamethasone  4 mg Intravenous Q24H   docusate sodium  100 mg Oral BID   guaiFENesin  600 mg Oral BID   metoprolol tartrate  2.5 mg Intravenous Q6H   [START ON 08/22/2018] romiPLOStim  2 mcg/kg Subcutaneous Weekly   Tbo-filgastrim (GRANIX) SQ  480 mcg Subcutaneous q1800   zolpidem  5 mg Oral QHS    Continuous Infusions:   Verneita Griffes, MD [facility] 5:06 PM

## 2018-08-18 NOTE — Progress Notes (Signed)
Zachary Bennett   DOB:29-May-1934   UT#:[id number]   SFK#:[id number]  Hem/Onc follow up note   Subjective:  Patient notes no acute new concerns. No bleeding, no fevers. Feels fatigued. Abdominal fullnees but no overt abdominal pain.    Objective:  Vitals:   08/17/18 2023   BP: 116/61   Pulse: 84   Resp: 16   Temp: 98.6 F (37 C)   SpO2: 99%     Body mass index is 29.26 kg/m. Marland Kitchen GENERAL:alert, in no acute distress and comfortable SKIN: no acute rashes, EYES: conjunctival pallor+ OROPHARYNX: MMM NECK: supple, no JVD LYMPH:  no palpable lymphadenopathy in the cervical, axillary or inguinal regions LUNGS: clear to auscultation HEART: regular rate & rhythm ABDOMEN:  normoactive bowel sounds , massive splenomegaly Extremity: no pedal edema PSYCH: alert & oriented x 3 with fluent speech NEURO: no focal motor/sensory deficits   Labs:  Marland Kitchen CBC Latest Ref Rng & Units 08/17/2018 08/16/2018  WBC 4.0 - 10.5 K/uL 0.6(LL) 0.6(LL)  Hemoglobin 13.0 - 17.0 g/dL 7.6(L) 7.5(L)  Hematocrit 39.0 - 52.0 % 24.0(L) 23.5(L)  Platelets 150 - 400 K/uL <5(LL) 7(LL)   . CMP Latest Ref Rng & Units 08/17/2018 08/16/2018 08/15/2018  Glucose 70 - 99 mg/dL 120(H) 101(H) 94  BUN 8 - 23 mg/dL 37(H) 34(H) 32(H)  Creatinine 0.61 - 1.24 mg/dL 1.09 1.18 1.31(H)  Sodium 135 - 145 mmol/L 141 141 140  Potassium 3.5 - 5.1 mmol/L 4.4 4.3 4.5  Chloride 98 - 111 mmol/L 109 109 111  CO2 22 - 32 mmol/L 22 23 20(L)  Calcium 8.9 - 10.3 mg/dL 7.8(L) 7.7(L) 7.4(L)  Total Protein 6.5 - 8.1 g/dL 5.0(L) 5.2(L) 5.3(L)  Total Bilirubin 0.3 - 1.2 mg/dL 3.2(H) 2.6(H) 3.0(H)  Alkaline Phos 38 - 126 U/L 142(H) 144(H) 145(H)  AST 15 - 41 U/L 31 33 39  ALT 0 - 44 U/L 18 19 19      Studies:  No results found.  Assessment: 84 y.o. male with   1. Severe pancytopenia, probable B-cell lymphoma and ITP . No overt MDS or findings of Aplastic anemia. S/p IVIG and started on weekly Rituxan. 2. Massive splenomegaly -- ?NHL vs passive  congestion from possible liver disease.. Elevated LFTs 3. Diffuse mediastinal and abdominal adenopathy 4.  AKI 5.  Community-acquired pneumonia 6.  Moderate to severe calorie and protein malnutrition   Plan:  -Patient has received 2 weekly doses of Rituxan. -on weekly Nplate 17mcg/kg  -continue daily granix in setting of pneumonia. -Plt <5K - receiving PLT today (Prn for PLT<10k or if bleeding)  - plz get 1hour post counts to re-evalute platelet transfusion response. -outpatient PET/CT scheduled to determine potential biopsy target. -PRBC prn for hgb<7 or if symptomatic - Dr. Lindi Adie will resume care on Tuesday.   Sullivan Lone, MD

## 2018-08-18 NOTE — Progress Notes (Signed)
Occupational Therapy Treatment Patient Details Name: Zachary Bennett MRN: [id number] DOB: May 16, 1934 Today's Date: 08/18/2018    History of present illness 84 y.o. male admitted with generalized weakness SOB, found to have CAP and AKI, abdominal pain secondary to splenomegaly. Pt currently awaiting PET scan for prior concern of lymphoma.    OT comments  Pt progressing towards acute OT goals. Pt eager to get OOB, requesting to walk around a bit prior to attempting bowel movement. Pt walked across threshold and into hallway a bit then to bathroom. Min A for functional transfers and mobility, mod A for pericare. Pt on RA throughout session. DOE 3/4, HR up to 125 with transfer/mobility. At end of session HR 103, O2 99. Pt up in recliner at end of session. D/c plan remains appropriate.     Follow Up Recommendations  Home health OT;Supervision/Assistance - 24 hour    Equipment Recommendations  3 in 1 bedside commode    Recommendations for Other Services      Precautions / Restrictions Precautions Precautions: Fall Restrictions Weight Bearing Restrictions: No       Mobility Bed Mobility Overal bed mobility: Needs Assistance Bed Mobility: Supine to Sit     Supine to sit: Min guard     General bed mobility comments: Pt using bed rails and extra time/effort to come to EOB position. Able to scoot hips to full EOB position at min guard level.   Transfers Overall transfer level: Needs assistance Equipment used: Rolling walker (2 wheeled) Transfers: Sit to/from Stand Sit to Stand: Min assist         General transfer comment: Cues for hand placement to and from seated surface.  Pt mildly unsteady in standing.  Rw utilized    Balance Overall balance assessment: Needs assistance Sitting-balance support: Bilateral upper extremity supported;Single extremity supported;Feet supported Sitting balance-Leahy Scale: Fair     Standing balance support: Bilateral upper extremity  supported Standing balance-Leahy Scale: Poor Standing balance comment: rw and some steadying assist                           ADL either performed or assessed with clinical judgement   ADL Overall ADL's : Needs assistance/impaired                         Toilet Transfer: Minimal assistance;RW;Ambulation   Toileting- Clothing Manipulation and Hygiene: Moderate assistance;Minimal assistance;Sit to/from stand Toileting - Clothing Manipulation Details (indicate cue type and reason): Pt stood with rw and min guard-min A, mod assist for pericare task     Functional mobility during ADLs: Minimal assistance;Rolling walker General ADL Comments: Pt completed short distance mobility (crossed threshold into hallway and turned around. Pt then completed toilet transfer and pericare.      Vision       Perception     Praxis      Cognition Arousal/Alertness: Awake/alert Behavior During Therapy: WFL for tasks assessed/performed Overall Cognitive Status: Within Functional Limits for tasks assessed                                 General Comments: Somewhat easily irritated/overwhelmed though thanking therapist at end of session.        Exercises     Shoulder Instructions       General Comments RA throughout session.     Pertinent Vitals/ Pain  Pain Assessment: Faces Faces Pain Scale: Hurts little more Pain Location: back Pain Descriptors / Indicators: Sore Pain Intervention(s): Monitored during session;Repositioned  Home Living                                          Prior Functioning/Environment              Frequency  Min 2X/week        Progress Toward Goals  OT Goals(current goals can now be found in the care plan section)  Progress towards OT goals: Progressing toward goals  Acute Rehab OT Goals Patient Stated Goal: go home with family OT Goal Formulation: With patient Time For Goal Achievement:  08/22/18 Potential to Achieve Goals: Good ADL Goals Pt Will Perform Grooming: Independently Pt Will Perform Lower Body Dressing: Independently Pt Will Transfer to Toilet: Independently;ambulating Pt Will Perform Tub/Shower Transfer: Independently  Plan Discharge plan remains appropriate;Frequency remains appropriate    Co-evaluation                 AM-PAC OT "6 Clicks" Daily Activity     Outcome Measure   Help from another person eating meals?: None Help from another person taking care of personal grooming?: A Little Help from another person toileting, which includes using toliet, bedpan, or urinal?: A Little Help from another person bathing (including washing, rinsing, drying)?: A Little Help from another person to put on and taking off regular upper body clothing?: A Little Help from another person to put on and taking off regular lower body clothing?: A Little 6 Click Score: 19    End of Session Equipment Utilized During Treatment: Rolling walker  OT Visit Diagnosis: Unsteadiness on feet (R26.81);Other abnormalities of gait and mobility (R26.89);Muscle weakness (generalized) (M62.81)   Activity Tolerance Patient tolerated treatment well;Patient limited by fatigue   Patient Left in chair;with call bell/phone within reach   Nurse Communication          Time: 1610-9604 OT Time Calculation (min): 22 min  Charges: OT General Charges $OT Visit: 1 Visit OT Treatments $Self Care/Home Management : 8-22 mins  Tyrone Schimke, OT Acute Rehabilitation Services Pager: [phone] Office: [phone]    Hortencia Pilar 08/18/2018, 1:14 PM

## 2018-08-18 NOTE — Discharge Summary (Signed)
Physician Discharge Summary  Zachary Bennett RJJ:[id number] DOB: September 16, 1934 DOA: 08/03/2018  PCP: Nicholas Lose, MD  Admit date: 08/03/2018 Discharge date: 08/18/2018  Time spent: 25 minutes  Recommendations for Outpatient Follow-up:  1. Patient to be offered choice of hospice and will discharge likely home with hospice 2. De-escalated multiple medications not compatible with end-of-life philosophy 3. Hospice physician will be PCP/Dr. Lindi Adie  Discharge Diagnoses:  Principal Problem:   ARF (acute renal failure) (HCC) Active Problems:   Thrombocytopenia (HCC)   Elevated LFTs   Hyperbilirubinemia   Splenomegaly   CAP (community acquired pneumonia)   Dyspnea   Prolonged QT interval   Chemotherapy-induced neutropenia (HCC)   Tumor lysis syndrome   Discharge Condition: Guarded  Diet recommendation: Comfort  Filed Weights   08/14/18 2048 08/15/18 2110 08/16/18 2028  Weight: 87.6 kg 87.4 kg 87.3 kg    History of present illness:  84 y.o.White ? thrombocytopenia and hyperlipidemia; who presented with complaints of abdominal pain and worsening shortness of breath- Admitted to the hospital from 4/29-5/2after being found to have splenomegaly with pancytopenia with imaging concerning for lymphoma.  Dr. Lindi Adie and ultimately the plan had been for an outpatient PET scan for targeted biopsy of a hypermetabolic site.  PET scan was not scheduled until the 19th of this month.  Re-presented with anorexia, fever, chills to emergency room on 5/11  afebrile, pulse 92-100, respirations 18-24, blood pressure 99/58-108/62, and O2 saturation maintained on room air.   WBC 1.9, platelets 18, sodium 133, CO2 16, BUN 73, creatinine 2.32, calcium 7.8, alkaline phosphatase 233, albumin 2.5, lipase 68, AST 66 ,  bilirubin 5.9.  Chest x-ray=right lower lobe infiltrate ? pneumonia. Patient was given 1 L normal saline IV fluids, vancomycin, and cefepime.   Hospitalization has been complicated by  severe pancytopenia and refractory ITP-which has been managed by Oncology  I discussed personally with Dr. Lindi Adie on 5/26 and patient has been offered choices and chooses to go home with hospice  Please see problem based format as below  Hospital Course:  Pancytopenia, B-cell lymphoproliferative disorder, ITP, massive splenomegaly, hyperuricemia Patient was treated with Rituxan and IVIG platelet--- also had hyperuricemia that was treated with rasburicase as well transfusions-his symptoms were intractable and it was felt there were no other lines of therapy options.  Abdominal pain secondary to splenomegaly Initially splenectomy was entertained however patient was not felt to be a candidate for the same-he is not really had abdominal pain since 5/21-I am discontinuing his oxycodone in favor of Roxanol and this can be titrated by hospice  AKI Thought secondary to volume depletion/hyperuricemia resolved around the time of discharge  Healthcare associated pneumonia? Ill-defined airspace opacity versus mediastinal lymphadenopathy repeated and completed cefepime for 7 days may need oxygen on discharge but has not needed for past several days  Anasarca, fluid overload secondary to resuscitation Prolonged QT C   Discharge Exam: Vitals:   08/18/18 0430 08/18/18 0900  BP: 116/63 111/78  Pulse: 78 83  Resp: 16 (!) 24  Temp:  97.7 F (36.5 C)  SpO2: 96% 98%    General: Awake alert no distress EOMI NCAT coherent Cardiovascular: S1-S2 no murmur Respiratory: Clear Anasarca  Discharge Instructions Consultants:  Oncology  Interventional radiology  Procedures:  IR bone marrow biopsy 08/05/2018   IVIG infusion planned for 5/19 and 5/20   Discharge Instructions    Diet - low sodium heart healthy   Complete by:  As directed    Increase activity slowly   Complete by:  As directed      Allergies as of 08/18/2018   No Known Allergies     Medication List    STOP taking these  medications   pravastatin 40 MG tablet Commonly known as:  PRAVACHOL     TAKE these medications   morphine CONCENTRATE 10 mg / 0.5 ml concentrated solution Place 0.5 mLs (10 mg total) under the tongue every 4 (four) hours as needed for up to 5 days for severe pain.      No Known Allergies    The results of significant diagnostics from this hospitalization (including imaging, microbiology, ancillary and laboratory) are listed below for reference.    Significant Diagnostic Studies: Dg Chest 1 View  Result Date: 08/03/2018 CLINICAL DATA:  Shortness of breath and chest pain EXAM: CHEST  1 VIEW COMPARISON:  Chest radiograph July 10, 2007 and chest CT July 23, 2018 FINDINGS: There is ill-defined airspace consolidation in the medial right base. Lung bases otherwise are clear. Heart size and pulmonary vascular normal. No adenopathy. There is aortic atherosclerosis. No bone lesions IMPRESSION: Ill-defined airspace opacity medial right base, concerning for pneumonia. Lungs elsewhere clear. Heart size normal. No adenopathy. Aortic Atherosclerosis (ICD10-I70.0). Electronically Signed   By: Lowella Grip III M.D.   On: 08/03/2018 13:48   Ct Chest W Contrast  Addendum Date: 07/24/2018   ADDENDUM REPORT: 07/24/2018 09:27 ADDENDUM: 13 mm right pericardial node also present. Electronically Signed   By: Nelson Chimes M.D.   On: 07/24/2018 09:27   Result Date: 07/24/2018 CLINICAL DATA:  Assess for thoracic malignancy. Abdominal pain. Splenomegaly and abdominal adenopathy. EXAM: CT CHEST WITH CONTRAST TECHNIQUE: Multidetector CT imaging of the chest was performed during intravenous contrast administration. CONTRAST:  66m OMNIPAQUE IOHEXOL 300 MG/ML  SOLN COMPARISON:  CT abdomen done yesterday. FINDINGS: Cardiovascular: Heart size is normal. The patient has coronary artery calcification. No pericardial effusion. Pulmonary arteries appear normal size. There is atherosclerosis of the aorta but no aneurysm or  dissection. Mediastinum/Nodes: Pretracheal lymph node axial image 51 measuring 14 mm in diameter. Subcarinal adenopathy axial image 79 measuring 3 cm in diameter. No sign of hilar nodes. No axillary adenopathy. Lungs/Pleura: Background centrilobular emphysema, upper lobe predominant. No pulmonary mass. Small pleural effusion on the right layering dependently with dependent atelectasis. Upper Abdomen: See results of abdominal scan yesterday. No interval change. Musculoskeletal: Negative IMPRESSION: Aortic atherosclerosis and coronary artery calcification. 14 mm pretracheal lymph node. 3 cm subcarinal lymph node. These could relate to the same pathologic process resulting in the abdominal adenopathy. There is no evidence of a primary neoplasm in the chest. Small right effusion layering dependently with mild right dependent atelectasis. Electronically Signed: By: MNelson ChimesM.D. On: 07/23/2018 21:40   Ct Abdomen Pelvis W Contrast  Addendum Date: 07/22/2018   ADDENDUM REPORT: 07/22/2018 16:10 ADDENDUM: The liver does not appear to be significantly abnormal, but given the presence of severe splenomegaly and porta hepatis adenopathy, severe hepatocellular disease or early hepatic cirrhosis cannot be excluded. Correlation with liver function tests is recommended. Electronically Signed   By: JMarijo ConceptionM.D.   On: 07/22/2018 16:10   Result Date: 07/22/2018 CLINICAL DATA:  Generalized abdominal pain. EXAM: CT ABDOMEN AND PELVIS WITH CONTRAST TECHNIQUE: Multidetector CT imaging of the abdomen and pelvis was performed using the standard protocol following bolus administration of intravenous contrast. CONTRAST:  1060mOMNIPAQUE IOHEXOL 300 MG/ML  SOLN COMPARISON:  CT scan of December 30, 2008. FINDINGS: Lower chest: Minimal right pleural effusion is  noted with adjacent subsegmental atelectasis. Hepatobiliary: Minimal cholelithiasis is noted with gallbladder wall thickening. No biliary dilatation is noted. The liver is  unremarkable. Pancreas: Unremarkable. No pancreatic ductal dilatation or surrounding inflammatory changes. Spleen: There is interval development of severe splenomegaly without focal abnormality. Adrenals/Urinary Tract: Adrenal glands are unremarkable. Kidneys are normal, without renal calculi, focal lesion, or hydronephrosis. Bladder is unremarkable. Stomach/Bowel: The stomach appears normal. There is no evidence of bowel obstruction or inflammation. The appendix is not clearly visualized. Vascular/Lymphatic: Atherosclerosis of abdominal aorta is noted. 2.4 cm fusiform right common iliac artery aneurysm is noted. Adenopathy is noted in the porta hepatis region as well as in the periaortic region. Porta hepatis adenopathy measures 4.7 x 2.6 cm. 12 mm right periaortic lymph node is noted. Reproductive: Stable severe enlargement of prostate gland is noted. Other: Bilateral inguinal hernias are noted which contain loops of small bowel, but does not result in obstruction or incarceration. Musculoskeletal: No acute or significant osseous findings. IMPRESSION: Minimal cholelithiasis is noted with gallbladder wall thickening. Possible cholecystitis cannot be excluded. HIDA scan may be performed for further evaluation. Severe splenomegaly is noted with significantly enlarged adenopathy in the porta hepatis and periaortic regions. This is concerning for lymphoma or other malignancy and clinical correlation is recommended. Stable severe enlargement of prostate gland is noted. Mild bilateral inguinal hernias are noted which contain loops of small bowel, but does not result in incarceration or obstruction. 2.4 cm fusiform right common iliac artery aneurysm. Aortic Atherosclerosis (ICD10-I70.0). Electronically Signed: By: Marijo Conception M.D. On: 07/22/2018 15:51   US Abdomen Limited  Result Date: 07/22/2018 CLINICAL DATA:  Elevated liver function tests. EXAM: ULTRASOUND ABDOMEN LIMITED RIGHT UPPER QUADRANT COMPARISON:  None.  FINDINGS: Gallbladder: Multiple small less than 1 cm gallstones are seen. The gallbladder is nondilated. Diffuse gallbladder wall thickening is seen measuring up to 6 mm. No evidence of pericholecystic fluid. No sonographic Murphy sign noted by sonographer. Common bile duct: Diameter: 4 mm, within normal limits. Liver: No focal lesion identified. Within normal limits in parenchymal echogenicity. Portal vein is patent on color Doppler imaging with normal direction of blood flow towards the liver. IMPRESSION: Cholelithiasis. Nondilated gallbladder with diffuse wall thickening, 5 without pericholecystic fluid or sonographic Murphy's sign. These findings are nonspecific. Chronic cholecystitis cannot be excluded. No evidence of biliary ductal dilatation. Unremarkable sonographic appearance of liver. Electronically Signed   By: Earle Gell M.D.   On: 07/22/2018 20:29   Ct Bone Marrow Biopsy & Aspiration  Result Date: 08/05/2018 INDICATION: Pancytopenia and splenomegaly. Please perform CT-guided biopsy for tissue diagnostic purposes. EXAM: CT-GUIDED BONE MARROW BIOPSY AND ASPIRATION MEDICATIONS: None ANESTHESIA/SEDATION: Fentanyl 25 mcg IV; Versed 1 mg IV Sedation Time: 11 Minutes; The patient was continuously monitored during the procedure by the interventional radiology nurse under my direct supervision. COMPLICATIONS: None immediate. PROCEDURE: Informed consent was obtained from the patient following an explanation of the procedure, risks, benefits and alternatives. The patient understands, agrees and consents for the procedure. All questions were addressed. A time out was performed prior to the initiation of the procedure. The patient was positioned prone and non-contrast localization CT was performed of the pelvis to demonstrate the iliac marrow spaces. The operative site was prepped and draped in the usual sterile fashion. Under sterile conditions and local anesthesia, a 22 gauge spinal needle was utilized for  procedural planning. Next, an 11 gauge coaxial bone biopsy needle was advanced into the left iliac marrow space. Needle position was confirmed with CT  imaging. Initially, bone marrow aspiration was performed. Next, a bone marrow biopsy was obtained with the 11 gauge outer bone marrow device. Samples were prepared with the cytotechnologist and deemed adequate. The needle was removed intact. Hemostasis was obtained with compression and a dressing was placed. The patient tolerated the procedure well without immediate post procedural complication. IMPRESSION: Successful CT guided left iliac bone marrow aspiration and core biopsy. Electronically Signed   By: Sandi Mariscal M.D.   On: 08/05/2018 11:46   Vas [place] Upper Extremity Venous Duplex  Result Date: 08/09/2018 UPPER VENOUS STUDY  Indications: Swelling, and Erythema Performing Technologist: Maudry Mayhew MHA, RDMS, RVT, RDCS  Examination Guidelines: A complete evaluation includes B-mode imaging, spectral Doppler, color Doppler, and power Doppler as needed of all accessible portions of each vessel. Bilateral testing is considered an integral part of a complete examination. Limited examinations for reoccurring indications may be performed as noted.  Right Findings: +----------+------------+---------+-----------+----------+-------+ RIGHT     CompressiblePhasicitySpontaneousPropertiesSummary +----------+------------+---------+-----------+----------+-------+ IJV           Full       Yes       Yes                      +----------+------------+---------+-----------+----------+-------+ Subclavian    Full       Yes       Yes                      +----------+------------+---------+-----------+----------+-------+ Axillary      Full       Yes       Yes                      +----------+------------+---------+-----------+----------+-------+ Brachial      Full       Yes       Yes                       +----------+------------+---------+-----------+----------+-------+ Radial        Full                                          +----------+------------+---------+-----------+----------+-------+ Ulnar         Full                                          +----------+------------+---------+-----------+----------+-------+ Cephalic      Full                                          +----------+------------+---------+-----------+----------+-------+ Basilic       Full                                          +----------+------------+---------+-----------+----------+-------+  Left Findings: +----------+------------+---------+-----------+----------+-------+ LEFT      CompressiblePhasicitySpontaneousPropertiesSummary +----------+------------+---------+-----------+----------+-------+ Subclavian               Yes       Yes                      +----------+------------+---------+-----------+----------+-------+  Summary:  Right: No evidence of deep vein thrombosis in the upper extremity. No evidence of superficial vein thrombosis in the upper extremity.  Left: No evidence of thrombosis in the subclavian.  *See table(s) above for measurements and observations.  Diagnosing physician: Ruta Hinds MD Electronically signed by Ruta Hinds MD on 08/09/2018 at 9:58:45 AM.    Final     Microbiology: Recent Results (from the past 240 hour(s))  Culture, blood (routine x 2)     Status: None (Preliminary result)   Collection Time: 08/14/18  7:29 PM  Result Value Ref Range Status   Specimen Description SITE NOT SPECIFIED  Final   Special Requests   Final    BOTTLES DRAWN AEROBIC ONLY Blood Culture results may not be optimal due to an inadequate volume of blood received in culture bottles   Culture   Final    NO GROWTH 4 DAYS Performed at [facility], [place] [address], [place], [place] [address]    Report Status PENDING  Incomplete  Culture, blood (routine x 2)     Status:  None (Preliminary result)   Collection Time: 08/14/18  9:40 PM  Result Value Ref Range Status   Specimen Description BLOOD RIGHT UPPER ARM  Final   Special Requests   Final    BOTTLES DRAWN AEROBIC AND ANAEROBIC Blood Culture adequate volume   Culture   Final    NO GROWTH 4 DAYS Performed at [facility], [address] [place], [place] [address]    Report Status PENDING  Incomplete     Labs: Basic Metabolic Panel: Recent Labs  Lab 08/12/18 1323 08/14/18 0337 08/15/18 0408 08/16/18 0404 08/17/18 0333  NA 140 141 140 141 141  K 3.9 4.1 4.5 4.3 4.4  CL 111 111 111 109 109  CO2 22 23 20* 23 22  GLUCOSE 118* 98 94 101* 120*  BUN 39* 32* 32* 34* 37*  CREATININE 1.12 1.05 1.31* 1.18 1.09  CALCIUM 7.5* 7.6* 7.4* 7.7* 7.8*   Liver Function Tests: Recent Labs  Lab 08/12/18 1323 08/14/18 0337 08/15/18 0408 08/16/18 0404 08/17/18 0333  AST 32 30 39 33 31  ALT 23 20 19 19 18   ALKPHOS 141* 146* 145* 144* 142*  BILITOT 1.4* 1.6* 3.0* 2.6* 3.2*  PROT 5.2* 5.5* 5.3* 5.2* 5.0*  ALBUMIN 1.8* 1.7* 1.5* 1.7* 1.6*   No results for input(s): LIPASE, AMYLASE in the last 168 hours. No results for input(s): AMMONIA in the last 168 hours. CBC: Recent Labs  Lab 08/14/18 0337 08/15/18 0408 08/16/18 0404 08/17/18 0333 08/18/18 0339  WBC 0.3* 0.7* 0.6* 0.6* 0.7*  NEUTROABS 0.2* 0.5* 0.4* 0.4* 0.5*  HGB 7.0* 7.8* 7.5* 7.6* 7.4*  HCT 21.5* 23.8* 23.5* 24.0* 23.5*  MCV 84.6 86.2 87.4 87.3 87.7  PLT 13* <5* 7* <5* 5*   Cardiac Enzymes: No results for input(s): CKTOTAL, CKMB, CKMBINDEX, TROPONINI in the last 168 hours. BNP: BNP (last 3 results) Recent Labs    08/03/18 1303  BNP 73.0    ProBNP (last 3 results) No results for input(s): PROBNP in the last 8760 hours.  CBG: No results for input(s): GLUCAP in the last 168 hours.     Signed:  Nita Sells MD   [facility] 08/18/2018, 6:05 PM

## 2018-08-18 NOTE — Progress Notes (Signed)
Physical Therapy Treatment Patient Details Name: Zachary Bennett MRN: [id number] DOB: September 25, 1934 Today's Date: 08/18/2018    History of Present Illness Pt is an 84 y.o. male admitted 08/03/18 with generalized weakness and SOB. Found to have CAP and AKI. Prior concern of lymphoma. Bone marrow biopsy shows small abnormal B-cell population identified. CT scan showing severe splenomegaly pretracheal lymph nodes and subcarinal lymph enlargement from 4/29. Other PMH includes abdominal hernia.   PT Comments    Pt planning to return home with hospice, potentially tomorrow. Increased time spent discussing assist and DME needs. Pt owns RW and wheelchair, but would benefit from additional BSC. Pt concerned about getting home, interested in ambulance transport but does not want the added expense. Will have necessary physical assist available from son for safety with mobility (wife uses RW); pt ambulating short distances with minA. Educ re: fall risk reduction, energy conservation. Pt declining ambulation today secondary to fatigue. If to remain admitted, will continue to follow acutely.    Follow Up Recommendations  Home health PT;Supervision/Assistance - 24 hour(home with hospice)     Equipment Recommendations  3in1 (PT)    Recommendations for Other Services       Precautions / Restrictions Precautions Precautions: Fall Restrictions Weight Bearing Restrictions: No    Mobility  Bed Mobility                  Transfers                    Ambulation/Gait                 Stairs             Wheelchair Mobility    Modified Rankin (Stroke Patients Only)       Balance                                            Cognition Arousal/Alertness: Awake/alert Behavior During Therapy: WFL for tasks assessed/performed Overall Cognitive Status: Within Functional Limits for tasks assessed                                 General  Comments: Becoming agitated then emotional during conversation, but able to redirect; seems overwhelmed by situation      Exercises      General Comments General comments (skin integrity, edema, etc.): Pt declining OOB mobility. Per MD, plan to d/c home with hospice likely tomorrow; increased time spent discussing need for family assist and DME for safe d/c home      Pertinent Vitals/Pain Pain Assessment: Faces Faces Pain Scale: Hurts a little bit Pain Location: Generalized Pain Descriptors / Indicators: Discomfort Pain Intervention(s): Limited activity within patient's tolerance    Home Living                      Prior Function            PT Goals (current goals can now be found in the care plan section) Acute Rehab PT Goals Patient Stated Goal: go home with family PT Goal Formulation: With patient Potential to Achieve Goals: Fair Progress towards PT goals: Progressing toward goals    Frequency    Min 3X/week      PT Plan Current plan remains appropriate  Co-evaluation              AM-PAC PT "6 Clicks" Mobility   Outcome Measure  Help needed turning from your back to your side while in a flat bed without using bedrails?: None Help needed moving from lying on your back to sitting on the side of a flat bed without using bedrails?: None Help needed moving to and from a bed to a chair (including a wheelchair)?: A Little Help needed standing up from a chair using your arms (e.g., wheelchair or bedside chair)?: A Little Help needed to walk in hospital room?: A Little Help needed climbing 3-5 steps with a railing? : A Lot 6 Click Score: 19    End of Session Equipment Utilized During Treatment: Oxygen Activity Tolerance: Patient limited by fatigue Patient left: in bed Nurse Communication: Mobility status PT Visit Diagnosis: Unsteadiness on feet (R26.81)     Time: 6237-6283 PT Time Calculation (min) (ACUTE ONLY): 13 min  Charges:  $Self  Care/Home Management: 8-22                    Mabeline Caras, PT, DPT Acute Rehabilitation Services  Pager [phone] Office [place] 08/18/2018, 5:12 PM

## 2018-08-18 NOTE — Progress Notes (Addendum)
Zachary Bennett   DOB:02/13/1935   MR#:[id number]   CSN#:[id number]  Hem/Onc follow up note   Subjective: Patient up to chair today. Has no complaints. Denies bleeding.    Objective:  Vitals:   08/18/18 0430 08/18/18 0900  BP: 116/63 111/78  Pulse: 78 83  Resp: 16 (!) 24  Temp:  97.7 F (36.5 C)  SpO2: 96% 98%    Body mass index is 29.26 kg/m.  Intake/Output Summary (Last 24 hours) at 08/18/2018 1330 Last data filed at 08/18/2018 1201 Gross per 24 hour  Intake 120 ml  Output 1450 ml  Net -1330 ml     Sclerae unicteric  Oropharynx clear  No peripheral adenopathy  Lungs clear -- no rales or rhonchi  Heart regular rate and rhythm  Abdomen mild distended, (+) splenomegaly and tenderness   MSK no focal spinal tenderness, mild b/l leg edema   Neuro nonfocal   CBG (last 3)  No results for input(s): GLUCAP in the last 72 hours.   Labs:   Urine Studies No results for input(s): UHGB, CRYS in the last 72 hours.  Invalid input(s): UACOL, UAPR, USPG, UPH, UTP, UGL, UKET, UBIL, UNIT, UROB, ULEU, UEPI, UWBC, URBC, UBAC, CAST, UCOM, BILUA  Basic Metabolic Panel: Recent Labs  Lab 08/12/18 1323 08/14/18 0337 08/15/18 0408 08/16/18 0404 08/17/18 0333  NA 140 141 140 141 141  K 3.9 4.1 4.5 4.3 4.4  CL 111 111 111 109 109  CO2 22 23 20* 23 22  GLUCOSE 118* 98 94 101* 120*  BUN 39* 32* 32* 34* 37*  CREATININE 1.12 1.05 1.31* 1.18 1.09  CALCIUM 7.5* 7.6* 7.4* 7.7* 7.8*   GFR Estimated Creatinine Clearance: 57.1 mL/min (by C-G formula based on SCr of 1.09 mg/dL). Liver Function Tests: Recent Labs  Lab 08/12/18 1323 08/14/18 0337 08/15/18 0408 08/16/18 0404 08/17/18 0333  AST 32 30 39 33 31  ALT 23 20 19 19 18  ALKPHOS 141* 146* 145* 144* 142*  BILITOT 1.4* 1.6* 3.0* 2.6* 3.2*  PROT 5.2* 5.5* 5.3* 5.2* 5.0*  ALBUMIN 1.8* 1.7* 1.5* 1.7* 1.6*   No results for input(s): LIPASE, AMYLASE in the last 168 hours. No results for input(s): AMMONIA in the last 168  hours. Coagulation profile No results for input(s): INR, PROTIME in the last 168 hours.  CBC: Recent Labs  Lab 08/14/18 0337 08/15/18 0408 08/16/18 0404 08/17/18 0333 08/18/18 0339  WBC 0.3* 0.7* 0.6* 0.6* 0.7*  NEUTROABS 0.2* 0.5* 0.4* 0.4* 0.5*  HGB 7.0* 7.8* 7.5* 7.6* 7.4*  HCT 21.5* 23.8* 23.5* 24.0* 23.5*  MCV 84.6 86.2 87.4 87.3 87.7  PLT 13* <5* 7* <5* 5*   Cardiac Enzymes: No results for input(s): CKTOTAL, CKMB, CKMBINDEX, TROPONINI in the last 168 hours. BNP: Invalid input(s): POCBNP CBG: No results for input(s): GLUCAP in the last 168 hours. D-Dimer No results for input(s): DDIMER in the last 72 hours. Hgb A1c No results for input(s): HGBA1C in the last 72 hours. Lipid Profile No results for input(s): CHOL, HDL, LDLCALC, TRIG, CHOLHDL, LDLDIRECT in the last 72 hours. Thyroid function studies No results for input(s): TSH, T4TOTAL, T3FREE, THYROIDAB in the last 72 hours.  Invalid input(s): FREET3 Anemia work up No results for input(s): VITAMINB12, FOLATE, FERRITIN, TIBC, IRON, RETICCTPCT in the last 72 hours. Microbiology Recent Results (from the past 240 hour(s))  Culture, blood (routine x 2)     Status: None (Preliminary result)   Collection Time: 08/14/18  7:29 PM  Result Value Ref Range   Status   Specimen Description SITE NOT SPECIFIED  Final   Special Requests   Final    BOTTLES DRAWN AEROBIC ONLY Blood Culture results may not be optimal due to an inadequate volume of blood received in culture bottles   Culture   Final    NO GROWTH 4 DAYS Performed at [facility], [address] [place], [place] [address]    Report Status PENDING  Incomplete  Culture, blood (routine x 2)     Status: None (Preliminary result)   Collection Time: 08/14/18  9:40 PM  Result Value Ref Range Status   Specimen Description BLOOD RIGHT UPPER ARM  Final   Special Requests   Final    BOTTLES DRAWN AEROBIC AND ANAEROBIC Blood Culture adequate volume   Culture   Final     NO GROWTH 4 DAYS Performed at [facility], [address] [place], [place] [address]    Report Status PENDING  Incomplete      Studies:  No results found.  Assessment: 84 y.o. male   1. 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 splenomagely 3. Diffuse mediastinal and abdominal adenopathy 4.  AKI 5.  Community-acquired pneumonia 6.  Moderate to severe calorie and protein malnutrition    Plan:  This is an 84-year-old male with   1.  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  Splenomegaly 3.  Enlarged mediastinal lymph nodes 4.  Community-acquired pneumonia 5.  Acute renal failure 6.  Hypoalbuminemia 7.  Hyperuricemia; resolved.   -Bone marrow biopsy shows small abnormal B-cell population identified.  S/P 2 doses of Rituxan, IVIG, on dexamethasone 4 mg daily, Granix, and N-plate without improvement in his counts. Will continue Dexamethasone, Granix, and N-plate for now.  -He has no active bleeding.  We will hold off on a platelet transfusion today pending goals of care discussion. -Hemoglobin is stable. No transfusion indicated.   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  Status post 1 dose of rasburicase 6 mg on 08/04/2018 with significant drop in uric acid level.  Continue allopurinol. -Goals of care: Prolongation of life and improvement of symptoms.  The patient does not wish to be intubated or resuscitated if his heart were to stop. -Disposition:  Still no response to current treatment.  Further discussion pending goals of care later today by Dr. Gudena.   Kristin Curcio, NP 08/18/2018  1:30 PM  Attending Note  I personally saw the patient, reviewed the chart and examined the patient. The plan of care was discussed with the patient and the admitting team. I agree with the assessment and plan as documented above.   Severe pancytopenia: Secondary B-cell lymphoproliferative disorder  with ITP unresponsive to dexamethasone, IVIG, Rituxan, Nplate. We spent a lot of time discussing his prognosis and potential changes that he may see down the line.  Patient is completely in agreement with her plan to pursue hospice care. His only wish was to go back home so that he can sort out some of the paperwork and other issues as well as express his thoughts and wishes to the rest of his family members.  Comfort care measures only. I discussed with his son and I also discussed with his wife.  Everyone were grateful with the care they received in the hospital.   

## 2018-08-19 LAB — CULTURE, BLOOD (ROUTINE X 2)
Culture: NO GROWTH
Culture: NO GROWTH
Special Requests: ADEQUATE

## 2018-08-19 NOTE — Progress Notes (Signed)
Patient seen and examined at bedside by me.  He has severe pancytopenia secondary to B-cell lymphoproliferative disorder with ITP.  Unresponsive to routine treatment.  Patient is opted for hospice care.  Arrangements for home hospice has been made by previous provider, Dr. Verlon Au.  I have answered all the patient's questions this morning at bedside.  Also discussed his case with the caseworker from today.  Vitals are stable.  We will transition the patient home today. Discharge summary has been completed by Dr. Verlon Au 08/18/2018.  Please call with further questions as needed.  Gerlean Ren MD  [facility]

## 2018-08-19 NOTE — TOC Initial Note (Addendum)
Transition of Care [facility]) - Initial/Assessment Note    Patient Details  Name: Zachary Bennett MRN: [id number] Date of Birth: 1934/12/08  Transition of Care [facility] CM/SW Contact:    Bartholomew Crews, RN Phone Number: 08/19/2018, 10:22 AM  Clinical Narrative:                 PTA home with spouse and son. Received consult to offer choice. Spoke with patient at bedside who provided permission for CM to contact son. Son was at the bank, but stated he would call CM back. CM to continue to follow for transition of care needs.   Update: Received call back from patient's son, Zachary Bennett. Discussed choice for hospice care. Referral made to [facility]. Son plans to provide transportation home at time of discharge. Anticipate transition home today pending arrangements with hospice.  Update: Referral to [employer] for 3N1 to be delivered to the room. Update: 3N1 to be delivered to the home.   Expected Discharge Plan: Home w Hospice Care Barriers to Discharge: No Barriers Identified   Patient Goals and CMS Choice   CMS Medicare.gov Compare Post Acute Care list provided to:: Patient Choice offered to / list presented to : Patient, Adult Children  Expected Discharge Plan and Services Expected Discharge Plan: Home w Hospice Care In-house Referral: Hospice / Palliative Care Discharge Planning Services: CM Consult Post Acute Care Choice: Hospice Living arrangements for the past 2 months: Single Family Home                 DME Arranged: N/A DME Agency: NA                  Prior Living Arrangements/Services Living arrangements for the past 2 months: Single Family Home Lives with:: Self, Spouse, Adult Children Patient language and need for interpreter reviewed:: Yes        Need for Family Participation in Patient Care: Yes (Comment) Care giver support system in place?: Yes (comment)   Criminal Activity/Legal Involvement Pertinent to Current Situation/Hospitalization: No -  Comment as needed  Activities of Daily Living Home Assistive Devices/Equipment: None, Blood pressure cuff ADL Screening (condition at time of admission) Patient's cognitive ability adequate to safely complete daily activities?: Yes Is the patient deaf or have difficulty hearing?: No Does the patient have difficulty seeing, even when wearing glasses/contacts?: No Does the patient have difficulty concentrating, remembering, or making decisions?: No Patient able to express need for assistance with ADLs?: Yes Does the patient have difficulty dressing or bathing?: Yes Independently performs ADLs?: Yes (appropriate for developmental age) Toileting: Needs assistance Is this a change from baseline?: Pre-admission baseline In/Out Bed: Needs assistance Is this a change from baseline?: Pre-admission baseline Does the patient have difficulty walking or climbing stairs?: No Weakness of Legs: Both Weakness of Arms/Hands: None  Permission Sought/Granted Permission sought to share information with : Family Supports Permission granted to share information with : Yes, Verbal Permission Granted  Share Information with NAME: Artie Mcintyre     Permission granted to share info w Relationship: son  Permission granted to share info w Contact Information: [phone]  Emotional Assessment Appearance:: Appears stated age Attitude/Demeanor/Rapport: Engaged Affect (typically observed): Accepting Orientation: : Oriented to Self, Oriented to  Time, Oriented to Place, Oriented to Situation Alcohol / Substance Use: Not Applicable Psych Involvement: No (comment)  Admission diagnosis:  Dehydration [E86.0] Hyperbilirubinemia [E80.6] Peripheral edema [R60.9] Healthcare-associated pneumonia [J18.9] AKI (acute kidney injury) ([place]) [N17.9] Patient Active Problem List   Diagnosis Date  Noted  . Chemotherapy-induced neutropenia ([place])   . Tumor lysis syndrome   . ARF (acute renal failure) ([place]) 08/03/2018  .  CAP (community acquired pneumonia) 08/03/2018  . Dyspnea 08/03/2018  . Prolonged QT interval 08/03/2018  . AKI (acute kidney injury) ([place]) 07/22/2018  . Elevated LFTs   . Epigastric pain   . Hyperbilirubinemia   . Splenomegaly   . Pancytopenia ([place])   . Thrombocytopenia ([place]) 06/04/2016   PCP:  Nicholas Lose, MD Pharmacy:   [facility], [place] [address] [place] [address] Phone: [phone] Fax: [id number] [place], [place] [facility] [place] [address] [place] [address] Phone: [phone] Fax: [phone]     Social Determinants of Health (SDOH) Interventions    Readmission Risk Interventions No flowsheet data found.

## 2018-08-19 NOTE — Progress Notes (Signed)
Zachary Bennett to be discharged home per MD order. Discussed prescriptions and follow up appointments with the patient. Prescriptions given to patient; medication list explained in detail. Patient verbalized understanding.  Skin clean, dry and intact without evidence of skin break down, no evidence of skin tears noted. IV catheter discontinued intact. Site without signs and symptoms of complications. Dressing and pressure applied. Pt denies pain at the site currently. No complaints noted.  Patient free of lines, drains, and wounds.   An After Visit Summary (AVS) was printed and given to the patient. Patient escorted via wheelchair, and discharged home via private auto.  Baldo Ash, RN

## 2018-08-19 NOTE — Progress Notes (Addendum)
Manufacturing engineer [facility]) Hospice  Received referral from [place], for home hospice services once discharged.  Patient and chart under review by [facility] physician and eligibility is pending at this time.  Spoke to Legrand Como (son), confirmed interest.  Plan is for the pt to discharge today back home, by POV.    DME discussed, in the home pt has access to a walker and a wheelchair.  Son requested a bedside commode prior to d/c.  RN Case Manager arranged with [facility] to deliver [facility] to room prior to d/c.  Please send completed DNR home with the patient.  If any medications for comfort are anticipated prior to [facility] services beginning, please sent prescriptions home with pt.  **update, 1245pm, pt is eligible for hospice services at home  Thank you for this referral, Venia Carbon RN, BSN, [facility] (in [place] under [facility] and [facility]) [phone]

## 2018-08-19 NOTE — Progress Notes (Signed)
Brief Oncology Note:  The patient was seen this morning. Reports mild discomfort to his abdomen this morning. No bleeding. He will be discharging to home with Hospice services. Hopefully he will D/C later today. Dexamethasone, Granix, and N-Plate have been discontinued. No additional needs or concerns identified this morning.   Mikey Bussing, DNP, AGPCNP-BC, AOCNP

## 2018-08-24 DEATH — deceased

## 2020-05-18 IMAGING — DX CHEST  1 VIEW
1 series · 2 of 2 positions shown · non-contrast
Comparison: Chest radiograph July 10, 2007 and chest CT July 23, 2018

CLINICAL DATA: Shortness of breath and chest pain

EXAM:
CHEST  1 VIEW

[Series 1: chest · 0.14mm/px · 2 of 2 slices shown]
[im 1/2]
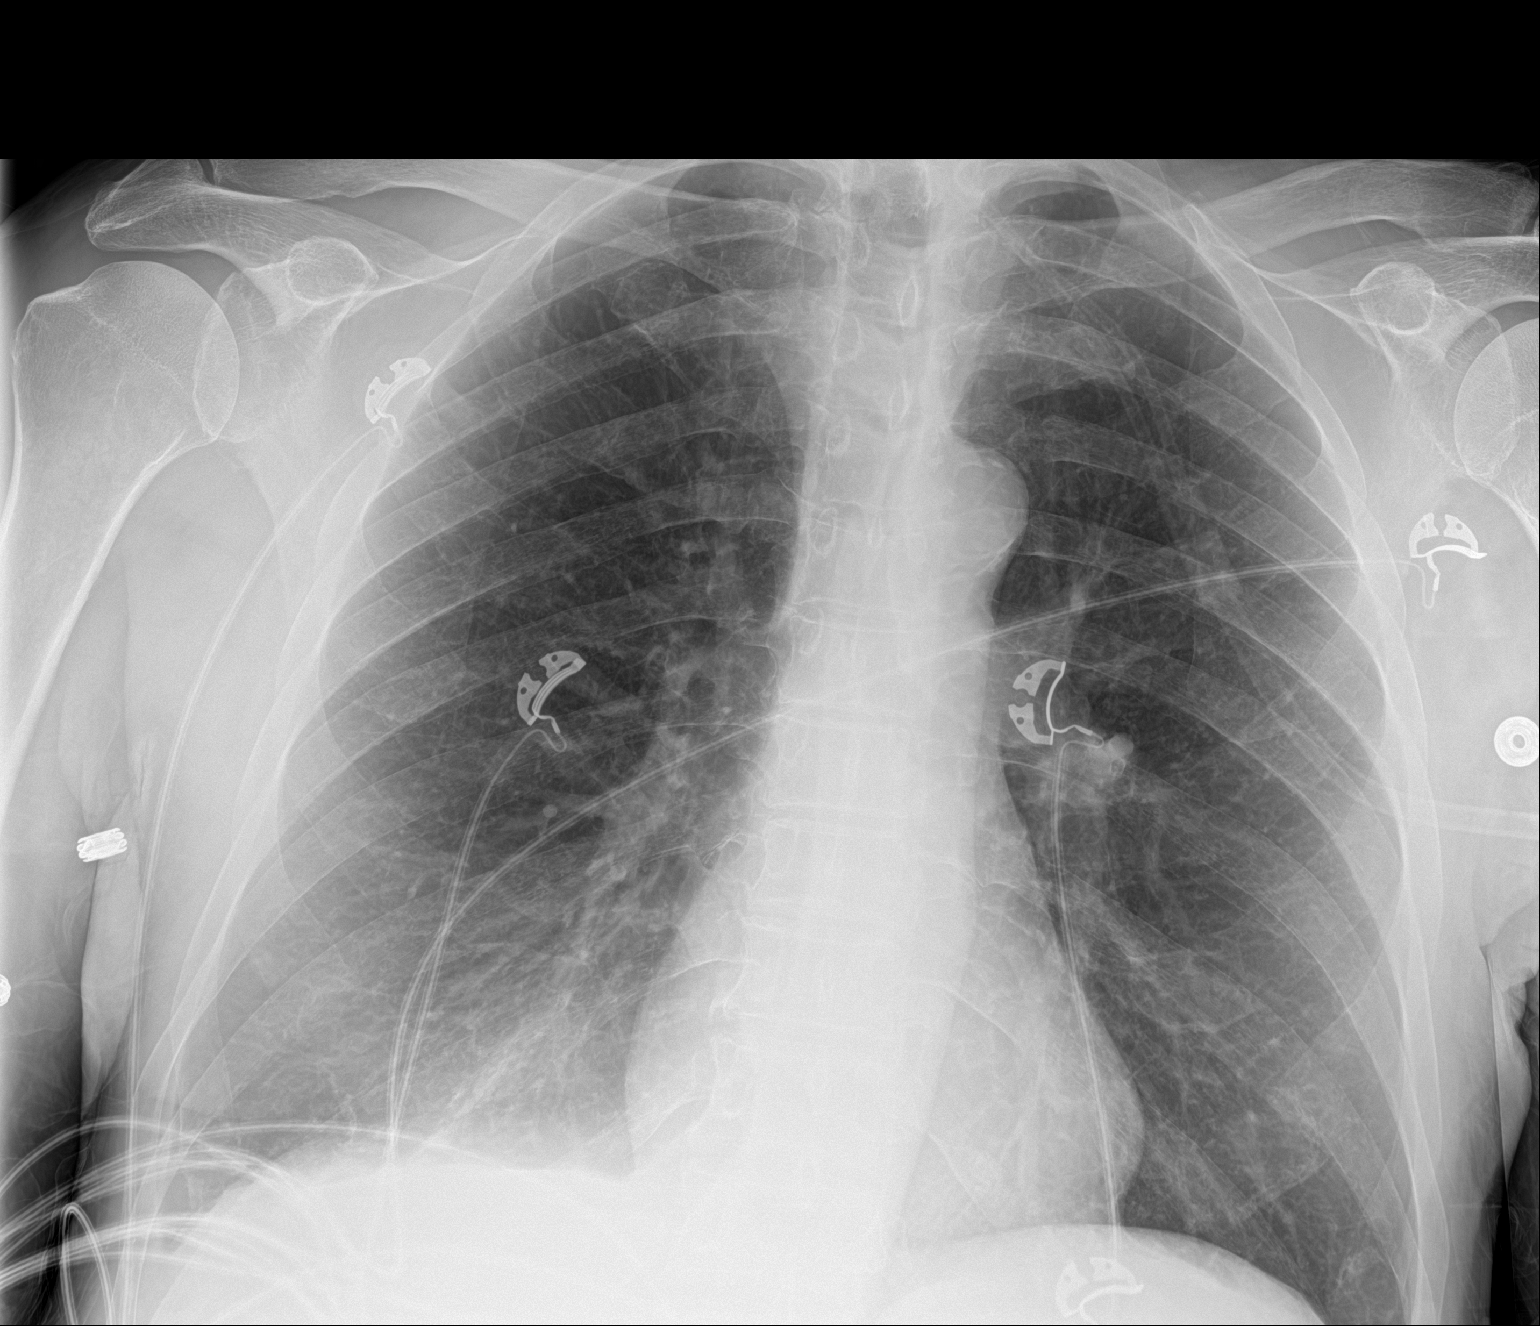
[im 2/2]
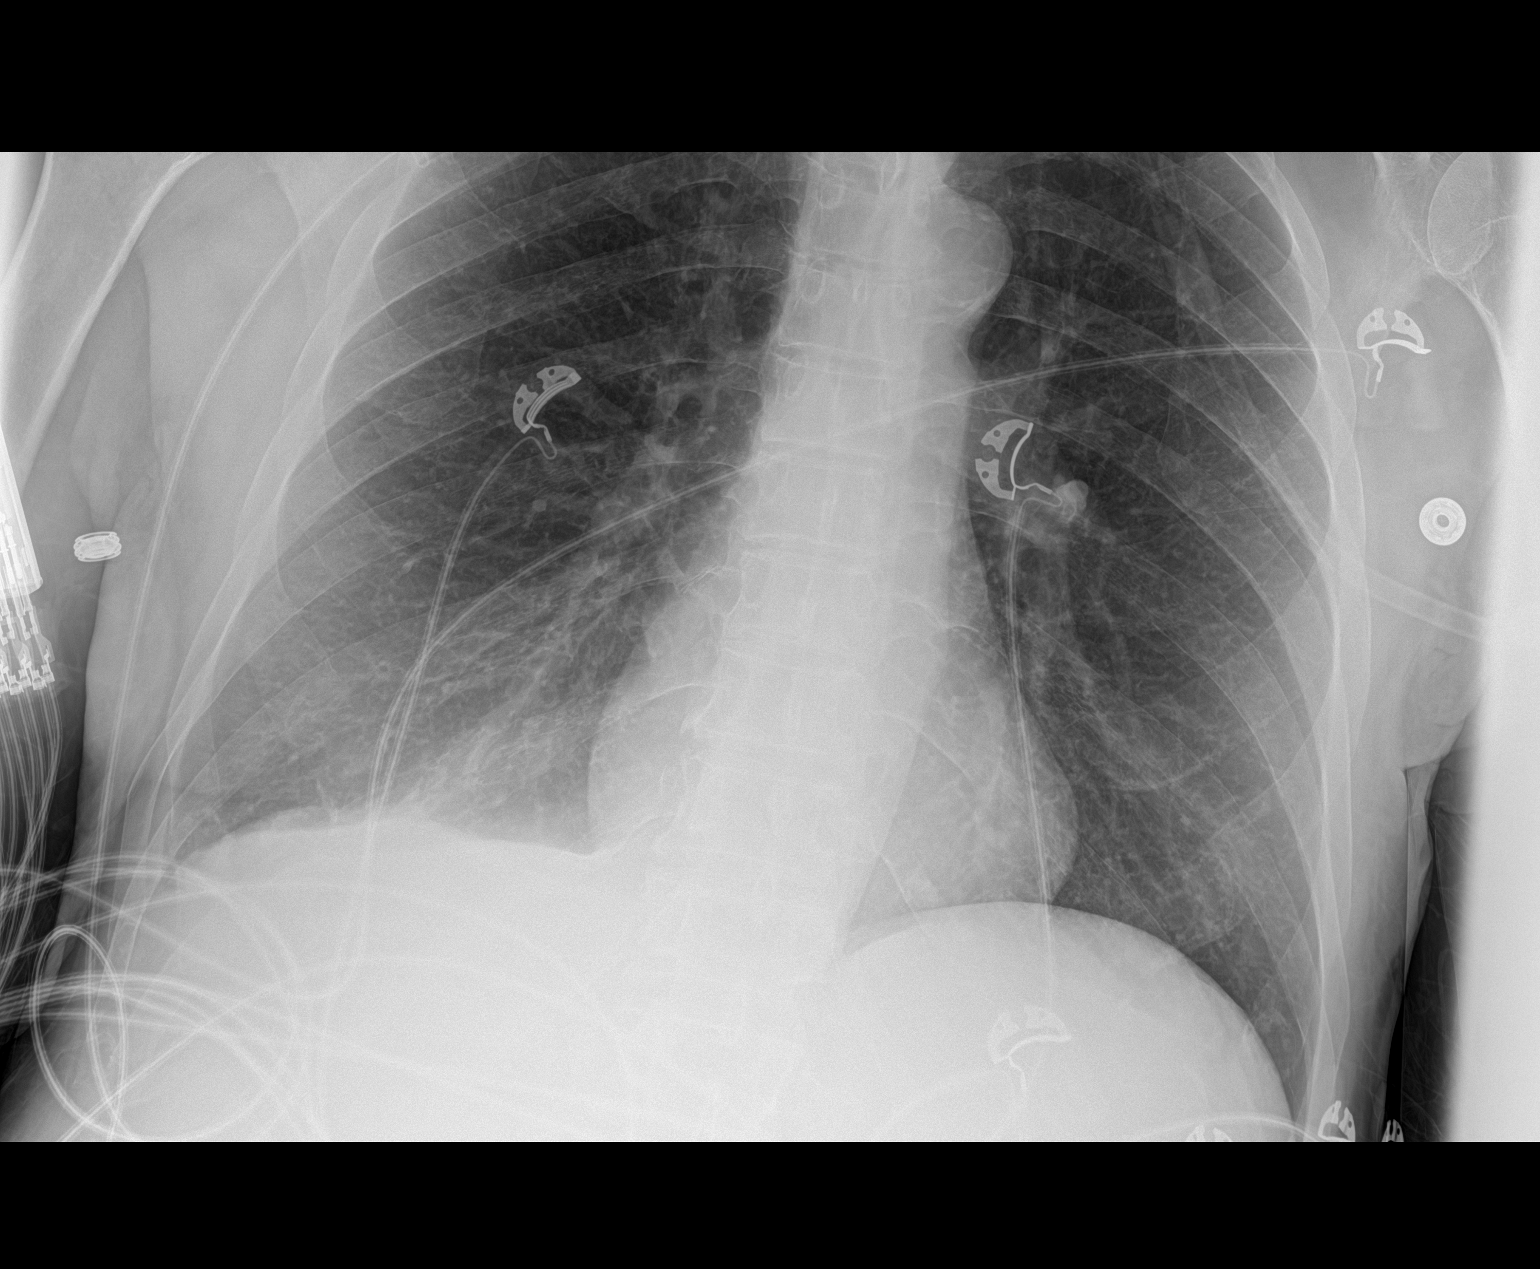

[2 of 2 positions shown; findings below may reference images not displayed]

FINDINGS: There is ill-defined airspace consolidation in the medial right
base. Lung bases otherwise are clear. Heart size and pulmonary
vascular normal. No adenopathy. There is aortic atherosclerosis. No
bone lesions
IMPRESSION: Ill-defined airspace opacity medial right base, concerning for
pneumonia. Lungs elsewhere clear. Heart size normal. No adenopathy.
Aortic Atherosclerosis ([id number]).
